# Patient Record
Sex: Male | Born: 1950 | Race: White | Hispanic: No | Marital: Single | Smoking: Former smoker
Health system: Southern US, Community
[De-identification: ages and names within clinical notes are randomized; demographics above are authoritative.]

## PROBLEM LIST (undated history)

## (undated) DIAGNOSIS — F419 Anxiety disorder, unspecified: Secondary | ICD-10-CM

## (undated) DIAGNOSIS — K219 Gastro-esophageal reflux disease without esophagitis: Secondary | ICD-10-CM

## (undated) DIAGNOSIS — I10 Essential (primary) hypertension: Secondary | ICD-10-CM

## (undated) DIAGNOSIS — G4730 Sleep apnea, unspecified: Secondary | ICD-10-CM

## (undated) DIAGNOSIS — R410 Disorientation, unspecified: Secondary | ICD-10-CM

## (undated) DIAGNOSIS — F319 Bipolar disorder, unspecified: Secondary | ICD-10-CM

## (undated) DIAGNOSIS — E876 Hypokalemia: Secondary | ICD-10-CM

## (undated) DIAGNOSIS — I509 Heart failure, unspecified: Secondary | ICD-10-CM

---

## 2013-12-01 ENCOUNTER — Inpatient Hospital Stay: Payer: Self-pay | Admitting: Internal Medicine

## 2013-12-01 LAB — COMPREHENSIVE METABOLIC PANEL
ALK PHOS: 83 U/L
Albumin: 2.7 g/dL — ABNORMAL LOW (ref 3.4–5.0)
Anion Gap: 6 — ABNORMAL LOW (ref 7–16)
BUN: 15 mg/dL (ref 7–18)
Bilirubin,Total: 0.7 mg/dL (ref 0.2–1.0)
Calcium, Total: 8.2 mg/dL — ABNORMAL LOW (ref 8.5–10.1)
Chloride: 99 mmol/L (ref 98–107)
Co2: 27 mmol/L (ref 21–32)
Creatinine: 0.87 mg/dL (ref 0.60–1.30)
EGFR (Non-African Amer.): >60
GLUCOSE: 171 mg/dL — AB (ref 65–99)
OSMOLALITY: 269 (ref 275–301)
Potassium: 3.4 mmol/L — ABNORMAL LOW (ref 3.5–5.1)
SGOT(AST): 44 U/L — ABNORMAL HIGH (ref 15–37)
SGPT (ALT): 25 U/L (ref 12–78)
Sodium: 132 mmol/L — ABNORMAL LOW (ref 136–145)
TOTAL PROTEIN: 7.8 g/dL (ref 6.4–8.2)

## 2013-12-01 LAB — URINALYSIS, COMPLETE
Bilirubin,UR: NEGATIVE
Glucose,UR: 50 mg/dL (ref 0–75)
Ketone: NEGATIVE
LEUKOCYTE ESTERASE: NEGATIVE
NITRITE: NEGATIVE
Ph: 5 (ref 4.5–8.0)
Specific Gravity: 1.018 (ref 1.003–1.030)
Squamous Epithelial: <1
WBC UR: 3 /HPF (ref 0–5)

## 2013-12-01 LAB — TROPONIN I: Troponin-I: 0.04 ng/mL

## 2013-12-01 LAB — CBC
HCT: 38.2 % — ABNORMAL LOW (ref 40.0–52.0)
HGB: 12.5 g/dL — ABNORMAL LOW (ref 13.0–18.0)
MCH: 29.2 pg (ref 26.0–34.0)
MCHC: 32.8 g/dL (ref 32.0–36.0)
MCV: 89 fL (ref 80–100)
Platelet: 189 10*3/uL (ref 150–440)
RBC: 4.29 10*6/uL — ABNORMAL LOW (ref 4.40–5.90)
RDW: 14.9 % — ABNORMAL HIGH (ref 11.5–14.5)
WBC: 9.8 10*3/uL (ref 3.8–10.6)

## 2013-12-01 LAB — PRO B NATRIURETIC PEPTIDE: B-Type Natriuretic Peptide: 899 pg/mL — ABNORMAL HIGH (ref 0–125)

## 2013-12-02 LAB — BASIC METABOLIC PANEL
ANION GAP: 5 — AB (ref 7–16)
BUN: 17 mg/dL (ref 7–18)
Calcium, Total: 8.4 mg/dL — ABNORMAL LOW (ref 8.5–10.1)
Chloride: 103 mmol/L (ref 98–107)
Co2: 30 mmol/L (ref 21–32)
Creatinine: 0.76 mg/dL (ref 0.60–1.30)
EGFR (African American): >60
EGFR (Non-African Amer.): >60
Glucose: 75 mg/dL (ref 65–99)
Osmolality: 276 (ref 275–301)
Potassium: 3 mmol/L — ABNORMAL LOW (ref 3.5–5.1)
Sodium: 138 mmol/L (ref 136–145)

## 2013-12-02 LAB — CBC WITH DIFFERENTIAL/PLATELET
Basophil #: 0 10*3/uL (ref 0.0–0.1)
Basophil %: 0.4 %
EOS PCT: 0.1 %
Eosinophil #: 0 10*3/uL (ref 0.0–0.7)
HCT: 35.2 % — ABNORMAL LOW (ref 40.0–52.0)
HGB: 11.6 g/dL — ABNORMAL LOW (ref 13.0–18.0)
LYMPHS PCT: 13.3 %
Lymphocyte #: 1 10*3/uL (ref 1.0–3.6)
MCH: 29 pg (ref 26.0–34.0)
MCHC: 32.9 g/dL (ref 32.0–36.0)
MCV: 88 fL (ref 80–100)
Monocyte #: 0.5 x10 3/mm (ref 0.2–1.0)
Monocyte %: 7 %
NEUTROS ABS: 5.8 10*3/uL (ref 1.4–6.5)
Neutrophil %: 79.2 %
PLATELETS: 173 10*3/uL (ref 150–440)
RBC: 3.99 10*6/uL — AB (ref 4.40–5.90)
RDW: 14.8 % — ABNORMAL HIGH (ref 11.5–14.5)
WBC: 7.3 10*3/uL (ref 3.8–10.6)

## 2013-12-02 LAB — CLOSTRIDIUM DIFFICILE(ARMC)

## 2013-12-02 LAB — LIPID PANEL
Cholesterol: 81 mg/dL (ref 0–200)
HDL Cholesterol: 29 mg/dL — ABNORMAL LOW (ref 40–60)
Ldl Cholesterol, Calc: 42 mg/dL (ref 0–100)
Triglycerides: 48 mg/dL (ref 0–200)
VLDL CHOLESTEROL, CALC: 10 mg/dL (ref 5–40)

## 2013-12-02 LAB — URINE CULTURE

## 2013-12-04 LAB — BASIC METABOLIC PANEL
Anion Gap: 2 — ABNORMAL LOW (ref 7–16)
BUN: 13 mg/dL (ref 7–18)
CO2: 34 mmol/L — AB (ref 21–32)
CREATININE: 0.74 mg/dL (ref 0.60–1.30)
Calcium, Total: 8.6 mg/dL (ref 8.5–10.1)
Chloride: 101 mmol/L (ref 98–107)
EGFR (African American): >60
EGFR (Non-African Amer.): >60
GLUCOSE: 95 mg/dL (ref 65–99)
Osmolality: 274 (ref 275–301)
Potassium: 3.2 mmol/L — ABNORMAL LOW (ref 3.5–5.1)
Sodium: 137 mmol/L (ref 136–145)

## 2013-12-05 ENCOUNTER — Ambulatory Visit (HOSPITAL_COMMUNITY)
Admission: AD | Admit: 2013-12-05 | Discharge: 2013-12-05 | Disposition: A | Discharge status: Home / Self Care | Payer: Self-pay | Source: Other Acute Inpatient Hospital | Attending: Internal Medicine | Admitting: Internal Medicine

## 2013-12-05 DIAGNOSIS — J218 Acute bronchiolitis due to other specified organisms: Secondary | ICD-10-CM | POA: Insufficient documentation

## 2013-12-05 LAB — BASIC METABOLIC PANEL
Anion Gap: 5 — ABNORMAL LOW (ref 7–16)
BUN: 11 mg/dL (ref 7–18)
Calcium, Total: 8.4 mg/dL — ABNORMAL LOW (ref 8.5–10.1)
Chloride: 102 mmol/L (ref 98–107)
Co2: 35 mmol/L — ABNORMAL HIGH (ref 21–32)
Creatinine: 0.77 mg/dL (ref 0.60–1.30)
EGFR (African American): >60
Glucose: 87 mg/dL (ref 65–99)
Osmolality: 282 (ref 275–301)
Potassium: 3.1 mmol/L — ABNORMAL LOW (ref 3.5–5.1)
Sodium: 142 mmol/L (ref 136–145)

## 2013-12-05 LAB — MAGNESIUM: MAGNESIUM: 1.8 mg/dL

## 2013-12-06 LAB — CULTURE, BLOOD (SINGLE)

## 2014-01-01 ENCOUNTER — Other Ambulatory Visit: Payer: Self-pay | Admitting: Family Medicine

## 2014-01-01 LAB — CLOSTRIDIUM DIFFICILE(ARMC)

## 2014-03-25 ENCOUNTER — Other Ambulatory Visit: Payer: Self-pay | Admitting: Family Medicine

## 2014-03-25 LAB — URINALYSIS, COMPLETE
Bacteria: NONE SEEN
Bilirubin,UR: NEGATIVE
Blood: NEGATIVE
Glucose,UR: NEGATIVE mg/dL (ref 0–75)
KETONE: NEGATIVE
LEUKOCYTE ESTERASE: NEGATIVE
NITRITE: NEGATIVE
PH: 5 (ref 4.5–8.0)
Protein: NEGATIVE
RBC,UR: 1 /HPF (ref 0–5)
Specific Gravity: 1.016 (ref 1.003–1.030)
Squamous Epithelial: <1

## 2014-03-27 LAB — URINE CULTURE

## 2015-03-05 NOTE — Discharge Summary (Signed)
PATIENT NAME:  Dwayne Bridges, Dwayne Bridges#:  [id number] DATE OF BIRTH:  June 07, 1951  DATE OF ADMISSION:  12/01/2013 DATE OF DISCHARGE:  12/02/2013  The patient [facility] be [facility] where [facility].  We were awaiting a bed.    CHIEF COMPLAINT: Shortness of breath, hypoxia.   CURRENT DIAGNOSES:  1.  Acute respiratory failure secondary to acute diastolic congestive heart failure and a possible acute bronchitis.  2.  Sepsis on arrival with, tachycardia, tachypnea, possible bronchitis and Clostridium difficile.  3.  Clostridium difficile.  4.  Hypertension.  5.  Hypokalemia.  6.  Mild hyponatremia.  7.  History of mental retardation.  8.  Recent history of bilateral lower extremity cellulitis.   CURRENT MEDICATIONS: Aspirin 81 mg daily, Lovenox 40 mg subcutaneous daily, finasteride 5 mg daily, lisinopril 20 mg daily, terazosin 10 mg at bedtime, atorvastatin 40 mg daily, azithromycin 250 mg daily, Lasix 20 mg daily, lisinopril 20 mg daily, potassium chloride 60 mEq once a day, Flagyl 500 mg q. 8 hours, of note, we had stopped Levaquin IV, Zosyn IV today, as well as a Flagyl 250 mg q. 6 hours IV  HISTORY OF PRESENT ILLNESS AND HOSPITAL COURSE:  For full details of H and P, please see the dictation on January 20, by Dr. Amado CoeGouru, but briefly this is a 64 year old with hypertension, hyperlipidemia, and mentally challenged who has been going [facility].  He, of note, was [facility] for lower extremity infection was sent to rehab. He came in for cough, shortness of breath. He of note, had been given a prescription for possible pneumonia, but because of shortness of breath, he came in here where he was noted to have a pulse oximetry. He was initially transitioned onto a BiPAP for his hypoxemia. He had tachycardia with some PVCs on EKG. He was also tachypneic. Some Lasix was given and the patient had an x-ray of the chest done, which showed vascular  congestion with a bibasilar airspace opacities raising concern for mild pulmonary edema and pneumonia could also have a similar appearance. He was started on broad-spectrum antibiotics and also was given a dose of IV Lasix. He was initially  transitioned to Critical Care Unit from the ER; however, soon came off the BiPAP, was verbalizing and was placed on a nasal cannula. Echocardiogram was obtained showing ejection fraction of about 60% to 65% with impaired relaxation pattern of LV diastolic filling. At this point, his shortness of breath is significantly better. I doubt the patient had significant lobar pneumonia. He did not come with fevers or leukocytosis. I have stopped the broad-spectrum antibiotics and have started him on azithromycin for possible bronchitis, but I suspect that the bulk of his shortness of breath was likely acute diastolic congestive heart failure and have started him on 20 mg IV Lasix at this point. He did come in with sepsis on arrival with tachycardia and tachypnea and has tested positive for C. difficile here. He possibly also has bronchitis. Currently, he is on Flagyl and azithromycin and I have stopped broad-spectrum antibiotics. He did also present with mild hyponatremia with sodium of 132 with potassium of 3.4, mild hypokalemia, and sodium has corrected and potassium of be replaced today. [facility]. His blood cultures and urine cultures have been not grown to date.   CODE STATUS:  The patient is DO NOT RESUSCITATE per family's request.  TOTAL TIME SPENT: 35 minutes.   ____________________________ Krystal Eaton, MD sa:cc D: 12/02/2013 15:26:00 ET T: 12/02/2013 20:42:41 ET JOB#: [id number]  cc: Krystal Eaton, MD, <Dictator> Krystal Eaton MD ELECTRONICALLY SIGNED 12/26/2013 10:52

## 2015-03-05 NOTE — Discharge Summary (Signed)
PATIENT NAME:  Dwayne Bridges, Dwayne Bridges MR#:  [id number] DATE OF BIRTH:  04/16/1951  DATE OF ADMISSION:  12/01/2013 DATE OF DISCHARGE:  12/05/2013   Addendum to Discharge Summary   Please refer to the discharge summary on 12/02/2013. This is an addendum on 12/05/2013.  DISPOSITION: The patient [facility].   DISCHARGE DIAGNOSES:  1. Clostridium difficile colitis.  2. Sepsis secondary to Clostridium difficile colitis and bronchitis.  3. Acute bronchitis.  4. Acute diastolic heart failure.    HOSPITAL COURSE: The patient's hospital course remains the same as already dictated.   DISCHARGE MEDICATIONS: As follows:  1. DuoNebs 3 mL q.4 hours.  2. Flagyl 500 mg q.8 hours.  3. Atorvastatin 40 mg daily.  4. Tylenol 325 two tablets q.6 hours as needed.  5. Aspirin 81 mg daily.  6. Finasteride 5 mg daily.  7. Ensure Plus 3 times a day.  8. Theravite 1 tablet daily.  9. Terazosin 10 mg daily.  10. Lisinopril 40 mg daily.  11. Nystatin t.i.d.  12. Lasix 20 mg IV q.12 hours.  13. Azithromycin 250 mg daily for 4 more days.  14. Potassium chloride 20 mEq b.i.d.   HISTORY AND HOSPITAL COURSE: A 64 year old male who presented with diarrhea and sepsis, found to have C. difficile colitis and acute bronchitis. For further details, please refer to the H and P.   1. Acute hypoxic respiratory distress. This patient recovered rather quickly. Doubt it was pneumonia. He was weaned off the BiPAP and onto nasal cannula, without fevers and leukocytosis. He was placed on azithromycin for bronchitis. His blood cultures are negative to date. He also has a component of acute diastolic heart failure. His echocardiogram showed normal ejection fraction, but he has diastolic dysfunction on echocardiogram. I am increasing his Lasix to 20 IV b.i.d. He continues to be on 4 liters of oxygen. Not complaining of shortness of breath, but he does not wear oxygen at home.  2. Sepsis on arrival, with  tachycardia, tachypnea and possible bronchitis and C. difficile colitis. Blood cultures negative to date. The patient was continued on Flagyl, continue Zithromax for 3 more days. He has a Flexi-Seal in place for his C. difficile colitis.  3. Hypertension. The patient is hemodynamically stable. We are holding hydrochlorothiazide  due to hypokalemia.  4. Hypokalemia from diarrhea. His magnesium is normal. This is being repleted.  5. Hyponatremia due to diarrhea, which has improved.  6. Rash, likely fungal in nature. The patient is on nystatin. Can follow up with dermatology as an outpatient.   DISCHARGE INSTRUCTIONS: The [facility] is being [facility]. He is continued on a low fat, low cholesterol diet. He has a Flexi-Seal in place. His diarrhea is improving. He was started on Flagyl on the 21st and will need at least 14 days of therapy.   The patient is being transferred. The patient is medically stable.   TIME SPENT: 35 minutes.   ____________________________ Janyth ContesSital P. Juliene PinaMody, MD spm:lb D: 12/05/2013 13:11:56 ET T: 12/05/2013 13:30:09 ET JOB#: [id number]  cc: Skiler Olden P. Juliene PinaMody, MD, <Dictator> Janyth ContesSITAL P Koraline Phillipson MD ELECTRONICALLY SIGNED 12/05/2013 14:24

## 2015-03-05 NOTE — H&P (Signed)
PATIENT NAME:  Dwayne Bridges, Dwayne Bridges DATE OF BIRTH:  11-Jun-1951  DATE OF ADMISSION:  12/01/2013  PRIMARY CARE PHYSICIAN: Nonlocal.  REFERRING EMERGENCY ROOM PHYSICIAN: Lucrezia EuropeAllison Webster, MD   CHIEF COMPLAINT: Shortness of breath and hypoxia.   HISTORY OF PRESENT ILLNESS: The patient is a 64 year old mentally challenged male with past medical history of hypertension, hyperlipidemia, and other medical problems who was [facility] for lower extremity infection and he was eventually sent over to rehabilitation center. For the past 2 to 3 days, the patient has been coughing, and he had a chest x-ray done and diagnosed with pneumonia. He was started on antibiotics. A prescription was given, but planning to start antibiotics from today, but last night the patient became extremely short of breath and could not breathe. His pulse ox dropped down anywhere between 40 to 80%. It patient was initially placed on nonrebreather by EMS and he was brought into the ER. In the ER, with the patient being short of breath and hypoxic he was placed on BiPAP. A 12-lead EKG has revealed sinus tachycardia with PVCs. The patient's chest x-ray, portable, has revealed bibasilar airspace opacities with pulmonary edema which could be from pneumonia. Blood cultures and urine cultures were ordered, and the patient was given IV levofloxacin. Hospitalist team is called to admit the patient. During my examination, the patient was quite lethargic and on BiPAP, and I was unable to get any history from him. His nephew is present next to the bed and he has provided the details of the patient. According to the patient's nephew, the patient is mentally challenged and he used to live alone prior to his admission to [facility]. Niece and nephew used to provide him food. According to the nephew, the patient needs feeding assistance and the patient was ambulated prior to this acute illness. The patient has code status  as DNR and the patient's niece and nephew want to continue his code status as DNR.   PAST MEDICAL HISTORY: Mental retardation, hyperlipidemia, hypertension, and recent history of bilateral lower extremity cellulitis.   PAST SURGICAL HISTORY: None.  ALLERGIES: No known drug allergies.   PSYCHOSOCIAL HISTORY: The patient used to live at home. He used to live alone, but after [facility] he was sent over to the rehab center. No history of smoking, alcohol, or illicit drug usage.   FAMILY HISTORY: Heart problems run in his family.   REVIEW OF SYSTEMS: Unobtainable as the patient is lethargic and on BiPAP machine.   MEDICATIONS: At the nursing home: Tylenol 325 mg 2 tablets p.o. q. 6 hours as needed, thiamine 100 mg p.o. once daily, terazosin 10 mg p.o. once daily, lisinopril 20 mg p.o. once daily, Rocephin 1 gram injectable IM or IV, Robitussin 100 mg/5 mL 20 mL p.o. q. 4 hours as needed, lisinopril 20 mg p.o. once daily, hydrochlorothiazide 12.5 mg once a day, finasteride 5 mg once a day, aspirin 81 mg once daily, atorvastatin 40 mg p.o. at bedtime.   PHYSICAL EXAMINATION: VITAL SIGNS: Temperature is 98.7, pulse 94, respirations 28, blood pressure 113/67, and pulse ox is 97% on BiPAP.  HEENT: Normocephalic, atraumatic. Pupils are equal and react to light and accommodation. No scleral icterus. No sinus tenderness. The patient is on BiPAP mask.  NECK: Supple. No JVD. No thyromegaly.  LUNGS: Coarse bronchial breath sounds. No wheezing. Moderate air entry.  CARDIOVASCULAR: S1 and S2, regular rate and rhythm. No murmurs.  GASTROINTESTINAL: Soft.  Bowel sounds are positive in all 4 quadrants. Nontender, nondistended. No masses felt.  NEUROLOGIC: The patient is very lethargic and with altered mental status. He is on BiPAP mask. Cranial nerves could not be elicited. Motor and sensory could not be elicited as the patient is lethargic and with altered mentation.  EXTREMITIES: No cyanosis  and no clubbing, but he has bilateral lower extremity erythema with excoriations. According to the nephew at bedside, the erythema is trending down as he was treated at [facility].  SKIN: Warm to touch, normal turgor. No rashes. PSYCH: Mood and affect could not be elicited as the patient is lethargic.  MUSCULOSKELETAL: No joint effusion, tenderness, or erythema.   LABS AND IMAGING STUDIES: Portable chest x-ray has revealed vascular congestion with bibasilar airspace opacity raising concern for mild pulmonary edema. Pneumonia might have similar appearance. Small left pleural effusion is suspected.   BNP is 899. Glucose 171, BUN 15, creatinine 0.87, sodium 132, potassium 3.4, chloride 99, CO2 27. GFR is greater than 60. Anion gap 6. Serum osmolality 269. Calcium 8.2. LFTs: Total albumin is 2.7, bili total 0.7, alkaline phosphatase 83, AST 44, ALT 25, total protein serum is 7.8. Troponin 0.04. WBC 9.8, hemoglobin 12.5, hematocrit 38.2, and platelets 189. Urinalysis: Cloudy in appearance, yellow in color. Nitrite and leukocyte esterase are negative. Granular casts are present. Amorphous crystals are present.  ABG on FiO2 50% with pH 7.32, pCO2 53, pO2 84, bicarb 27.3, and base excess is 0.3.   Twelve-lead EKG: Sinus tachycardia and PVCs.   ASSESSMENT AND PLAN: A 64 year old Caucasian male who is mentally challenged and was recently admitted to [facility] regarding lower extremity cellulitis, got discharged to rehabilitation center, was brought into the ER for shortness of breath. He will be admitted with the following assessment and plan.  1.  Acute hypoxic respiratory distress, probably from pneumonia and parapneumonic effusions. There might be component of new onset congestive heart failure. We will admit the patient to CCU stepdown. The patient will be on BiPAP. We will get pan cultures. Antibiotics with Zosyn and Levaquin as the patient was recently admitted and used antibiotics.  2.   Possible component of congestive heart failure. Will obtain a 2-D echocardiogram and will provide him Lasix on as needed basis.  3.  Hyperlipidemia. Once the patient is more awake and alert we will resume his home medication, statin.  4.  Hypertension. Blood pressure is stable at this time. The patient will be continuing his home medication, lisinopril.  5.  Mental retardation. Nephew at bedside is requesting case management consult regarding his placement.   The patient is DNR. The patient's niece, Ms. Corrie Dandy, is the medical power of attorney. Diagnosis and plan of care was discussed in detail with the patient's nephew who is at bedside. Ms. Corrie Dandy is currently resting and she will come to the hospital in a few hours.   TOTAL TIME SPENT ON ADMISSION: 50 minutes. ____________________________ Ramonita Lab, MD ag:sb D: 12/01/2013 07:40:14 ET T: 12/01/2013 08:38:23 ET JOB#: [id number]  cc: Ramonita Lab, MD, <Dictator> Ramonita Lab MD ELECTRONICALLY SIGNED 12/18/2013 7:23

## 2016-04-02 ENCOUNTER — Other Ambulatory Visit
Admission: RE | Admit: 2016-04-02 | Discharge: 2016-04-02 | Disposition: A | Discharge status: Home / Self Care | Payer: Medicaid Other | Source: Ambulatory Visit | Attending: Nurse Practitioner | Admitting: Nurse Practitioner

## 2016-04-02 DIAGNOSIS — K922 Gastrointestinal hemorrhage, unspecified: Secondary | ICD-10-CM | POA: Insufficient documentation

## 2016-04-02 LAB — CBC
HCT: 36.6 % — ABNORMAL LOW (ref 40.0–52.0)
Hemoglobin: 12.2 g/dL — ABNORMAL LOW (ref 13.0–18.0)
MCH: 31.1 pg (ref 26.0–34.0)
MCHC: 33.3 g/dL (ref 32.0–36.0)
MCV: 93.2 fL (ref 80.0–100.0)
PLATELETS: 156 10*3/uL (ref 150–440)
RBC: 3.93 MIL/uL — AB (ref 4.40–5.90)
RDW: 16 % — ABNORMAL HIGH (ref 11.5–14.5)
WBC: 4.4 10*3/uL (ref 3.8–10.6)

## 2016-04-02 LAB — POTASSIUM

## 2019-03-27 ENCOUNTER — Inpatient Hospital Stay (HOSPITAL_COMMUNITY)
Admission: AD | Admit: 2019-03-27 | Discharge: 2019-03-31 | DRG: 177 | Disposition: A | Discharge status: Skilled Nursing Facility | Payer: Medicaid Other | Source: Other Acute Inpatient Hospital | Attending: Internal Medicine | Admitting: Internal Medicine

## 2019-03-27 ENCOUNTER — Emergency Department: Payer: Medicaid Other

## 2019-03-27 ENCOUNTER — Other Ambulatory Visit: Payer: Self-pay

## 2019-03-27 ENCOUNTER — Encounter (HOSPITAL_COMMUNITY): Payer: Self-pay

## 2019-03-27 ENCOUNTER — Inpatient Hospital Stay (HOSPITAL_COMMUNITY): Payer: Self-pay

## 2019-03-27 ENCOUNTER — Encounter: Payer: Self-pay | Admitting: Emergency Medicine

## 2019-03-27 ENCOUNTER — Emergency Department
Admission: EM | Admit: 2019-03-27 | Discharge: 2019-03-27 | Disposition: A | Discharge status: Other Acute Inpatient Hospital | Payer: Medicaid Other | Attending: Emergency Medicine | Admitting: Emergency Medicine

## 2019-03-27 DIAGNOSIS — F919 Conduct disorder, unspecified: Secondary | ICD-10-CM | POA: Diagnosis not present

## 2019-03-27 DIAGNOSIS — I4891 Unspecified atrial fibrillation: Secondary | ICD-10-CM | POA: Diagnosis not present

## 2019-03-27 DIAGNOSIS — Y95 Nosocomial condition: Secondary | ICD-10-CM | POA: Diagnosis present

## 2019-03-27 DIAGNOSIS — I509 Heart failure, unspecified: Secondary | ICD-10-CM | POA: Diagnosis not present

## 2019-03-27 DIAGNOSIS — J1289 Other viral pneumonia: Secondary | ICD-10-CM | POA: Diagnosis present

## 2019-03-27 DIAGNOSIS — I10 Essential (primary) hypertension: Secondary | ICD-10-CM | POA: Diagnosis present

## 2019-03-27 DIAGNOSIS — N400 Enlarged prostate without lower urinary tract symptoms: Secondary | ICD-10-CM | POA: Diagnosis present

## 2019-03-27 DIAGNOSIS — E785 Hyperlipidemia, unspecified: Secondary | ICD-10-CM | POA: Diagnosis not present

## 2019-03-27 DIAGNOSIS — L03115 Cellulitis of right lower limb: Secondary | ICD-10-CM | POA: Diagnosis present

## 2019-03-27 DIAGNOSIS — J9621 Acute and chronic respiratory failure with hypoxia: Secondary | ICD-10-CM | POA: Diagnosis present

## 2019-03-27 DIAGNOSIS — L97919 Non-pressure chronic ulcer of unspecified part of right lower leg with unspecified severity: Secondary | ICD-10-CM | POA: Diagnosis not present

## 2019-03-27 DIAGNOSIS — I5033 Acute on chronic diastolic (congestive) heart failure: Secondary | ICD-10-CM | POA: Diagnosis not present

## 2019-03-27 DIAGNOSIS — U071 COVID-19: Principal | ICD-10-CM | POA: Diagnosis present

## 2019-03-27 DIAGNOSIS — I480 Paroxysmal atrial fibrillation: Secondary | ICD-10-CM | POA: Diagnosis not present

## 2019-03-27 DIAGNOSIS — J9601 Acute respiratory failure with hypoxia: Secondary | ICD-10-CM | POA: Diagnosis not present

## 2019-03-27 DIAGNOSIS — Z66 Do not resuscitate: Secondary | ICD-10-CM | POA: Diagnosis present

## 2019-03-27 DIAGNOSIS — Z79899 Other long term (current) drug therapy: Secondary | ICD-10-CM

## 2019-03-27 DIAGNOSIS — I110 Hypertensive heart disease with heart failure: Secondary | ICD-10-CM | POA: Diagnosis not present

## 2019-03-27 DIAGNOSIS — R0602 Shortness of breath: Secondary | ICD-10-CM

## 2019-03-27 DIAGNOSIS — R0902 Hypoxemia: Secondary | ICD-10-CM | POA: Diagnosis not present

## 2019-03-27 HISTORY — DX: Essential (primary) hypertension: I10

## 2019-03-27 HISTORY — DX: Heart failure, unspecified: I50.9

## 2019-03-27 LAB — CBC WITH DIFFERENTIAL/PLATELET
Abs Immature Granulocytes: 0.02 10*3/uL (ref 0.00–0.07)
Basophils Absolute: 0 10*3/uL (ref 0.0–0.1)
Basophils Relative: 0 %
Eosinophils Absolute: 0 10*3/uL (ref 0.0–0.5)
Eosinophils Relative: 0 %
HCT: 36.5 % — ABNORMAL LOW (ref 39.0–52.0)
Hemoglobin: 11.2 g/dL — ABNORMAL LOW (ref 13.0–17.0)
Immature Granulocytes: 0 %
Lymphocytes Relative: 11 %
Lymphs Abs: 0.6 10*3/uL — ABNORMAL LOW (ref 0.7–4.0)
MCH: 28.8 pg (ref 26.0–34.0)
MCHC: 30.7 g/dL (ref 30.0–36.0)
MCV: 93.8 fL (ref 80.0–100.0)
Monocytes Absolute: 0.4 10*3/uL (ref 0.1–1.0)
Monocytes Relative: 9 %
Neutro Abs: 3.9 10*3/uL (ref 1.7–7.7)
Neutrophils Relative %: 80 %
Platelets: 182 10*3/uL (ref 150–400)
RBC: 3.89 MIL/uL — ABNORMAL LOW (ref 4.22–5.81)
RDW: 15 % (ref 11.5–15.5)
WBC: 4.9 10*3/uL (ref 4.0–10.5)
nRBC: 0 % (ref 0.0–0.2)

## 2019-03-27 LAB — CBC
HCT: 39.3 % (ref 39.0–52.0)
Hemoglobin: 11.7 g/dL — ABNORMAL LOW (ref 13.0–17.0)
MCH: 28.7 pg (ref 26.0–34.0)
MCHC: 29.8 g/dL — ABNORMAL LOW (ref 30.0–36.0)
MCV: 96.6 fL (ref 80.0–100.0)
Platelets: 181 10*3/uL (ref 150–400)
RBC: 4.07 MIL/uL — ABNORMAL LOW (ref 4.22–5.81)
RDW: 14.9 % (ref 11.5–15.5)
WBC: 4.9 10*3/uL (ref 4.0–10.5)
nRBC: 0 % (ref 0.0–0.2)

## 2019-03-27 LAB — COMPREHENSIVE METABOLIC PANEL
ALT: 20 U/L (ref 0–44)
AST: 40 U/L (ref 15–41)
Albumin: 2.5 g/dL — ABNORMAL LOW (ref 3.5–5.0)
Alkaline Phosphatase: 54 U/L (ref 38–126)
Anion gap: 9 (ref 5–15)
BUN: 19 mg/dL (ref 8–23)
CO2: 26 mmol/L (ref 22–32)
Calcium: 7.9 mg/dL — ABNORMAL LOW (ref 8.9–10.3)
Chloride: 102 mmol/L (ref 98–111)
Creatinine, Ser: 1.05 mg/dL (ref 0.61–1.24)
GFR calc Af Amer: >60 mL/min (ref >60)
GFR calc non Af Amer: >60 mL/min (ref >60)
Glucose, Bld: 160 mg/dL — ABNORMAL HIGH (ref 70–99)
Potassium: 4.3 mmol/L (ref 3.5–5.1)
Sodium: 137 mmol/L (ref 135–145)
Total Bilirubin: 0.8 mg/dL (ref 0.3–1.2)
Total Protein: 6.6 g/dL (ref 6.5–8.1)

## 2019-03-27 LAB — BRAIN NATRIURETIC PEPTIDE: B Natriuretic Peptide: 168 pg/mL — ABNORMAL HIGH (ref 0.0–100.0)

## 2019-03-27 LAB — URINALYSIS, ROUTINE W REFLEX MICROSCOPIC
Bilirubin Urine: NEGATIVE
Glucose, UA: NEGATIVE mg/dL
Hgb urine dipstick: NEGATIVE
Ketones, ur: NEGATIVE mg/dL
Leukocytes,Ua: NEGATIVE
Nitrite: NEGATIVE
Protein, ur: NEGATIVE mg/dL
Specific Gravity, Urine: 1.008 (ref 1.005–1.030)
pH: 5 (ref 5.0–8.0)

## 2019-03-27 LAB — LACTATE DEHYDROGENASE: LDH: 217 U/L — ABNORMAL HIGH (ref 98–192)

## 2019-03-27 LAB — CREATININE, SERUM
Creatinine, Ser: 1 mg/dL (ref 0.61–1.24)
GFR calc Af Amer: >60 mL/min (ref >60)
GFR calc non Af Amer: >60 mL/min (ref >60)

## 2019-03-27 LAB — D-DIMER, QUANTITATIVE: D-Dimer, Quant: 1.74 ug/mL-FEU — ABNORMAL HIGH (ref 0.00–0.50)

## 2019-03-27 LAB — FIBRINOGEN: Fibrinogen: 589 mg/dL — ABNORMAL HIGH (ref 210–475)

## 2019-03-27 LAB — C-REACTIVE PROTEIN: CRP: 14.5 mg/dL — ABNORMAL HIGH (ref <1.0)

## 2019-03-27 LAB — ABO/RH: ABO/RH(D): O POS

## 2019-03-27 LAB — PROCALCITONIN: Procalcitonin: <0.1 ng/mL

## 2019-03-27 LAB — LACTIC ACID, PLASMA: Lactic Acid, Venous: 1.3 mmol/L (ref 0.5–1.9)

## 2019-03-27 LAB — SEDIMENTATION RATE: Sed Rate: 58 mm/hr — ABNORMAL HIGH (ref 0–20)

## 2019-03-27 LAB — FIBRIN DERIVATIVES D-DIMER (ARMC ONLY): Fibrin derivatives D-dimer (ARMC): 1388.91 ng/mL (FEU) — ABNORMAL HIGH (ref 0.00–499.00)

## 2019-03-27 LAB — PROTIME-INR
INR: 1.1 (ref 0.8–1.2)
Prothrombin Time: 14.2 seconds (ref 11.4–15.2)

## 2019-03-27 LAB — TROPONIN I: Troponin I: 0.04 ng/mL (ref <0.03)

## 2019-03-27 LAB — FERRITIN: Ferritin: 166 ng/mL (ref 24–336)

## 2019-03-27 MED ORDER — FINASTERIDE 5 MG PO TABS
5.0000 mg | ORAL_TABLET | Freq: Every day | ORAL | Status: DC
  Administered 2019-03-27 – 2019-03-31 (×5): 5 mg via ORAL
  Filled 2019-03-27 (×6): qty 1

## 2019-03-27 MED ORDER — ENOXAPARIN SODIUM 40 MG/0.4ML ~~LOC~~ SOLN
40.0000 mg | SUBCUTANEOUS | Status: DC
  Administered 2019-03-27: 21:00:00 40 mg via SUBCUTANEOUS
  Filled 2019-03-27: qty 0.4

## 2019-03-27 MED ORDER — ONDANSETRON HCL 4 MG/2ML IJ SOLN
4.0000 mg | Freq: Four times a day (QID) | INTRAMUSCULAR | Status: DC | PRN
  Filled 2019-03-27: qty 2

## 2019-03-27 MED ORDER — ACETAMINOPHEN 500 MG PO TABS
1000.0000 mg | ORAL_TABLET | Freq: Once | ORAL | Status: AC
  Administered 2019-03-27: 1000 mg via ORAL
  Filled 2019-03-27: qty 2

## 2019-03-27 MED ORDER — DIVALPROEX SODIUM 125 MG PO CSDR
375.0000 mg | DELAYED_RELEASE_CAPSULE | Freq: Two times a day (BID) | ORAL | Status: DC
  Administered 2019-03-27 – 2019-03-31 (×8): 375 mg via ORAL
  Filled 2019-03-27 (×9): qty 3

## 2019-03-27 MED ORDER — TERAZOSIN HCL 5 MG PO CAPS
10.0000 mg | ORAL_CAPSULE | Freq: Every day | ORAL | Status: DC
  Filled 2019-03-27: qty 2

## 2019-03-27 MED ORDER — ACETAMINOPHEN 325 MG PO TABS: 650.0000 mg | ORAL_TABLET | ORAL | Status: DC | PRN

## 2019-03-27 MED ORDER — ATORVASTATIN CALCIUM 10 MG PO TABS
20.0000 mg | ORAL_TABLET | Freq: Every day | ORAL | Status: DC
  Administered 2019-03-27 – 2019-03-31 (×5): 20 mg via ORAL
  Filled 2019-03-27 (×5): qty 2

## 2019-03-27 MED ORDER — POTASSIUM CHLORIDE CRYS ER 20 MEQ PO TBCR
40.0000 meq | EXTENDED_RELEASE_TABLET | Freq: Two times a day (BID) | ORAL | Status: DC
  Administered 2019-03-27 – 2019-03-29 (×4): 40 meq via ORAL
  Filled 2019-03-27 (×4): qty 2

## 2019-03-27 MED ORDER — SODIUM CHLORIDE 0.9% FLUSH: 3.0000 mL | INTRAVENOUS | Status: DC | PRN

## 2019-03-27 MED ORDER — FUROSEMIDE 10 MG/ML IJ SOLN
60.0000 mg | Freq: Two times a day (BID) | INTRAMUSCULAR | Status: DC
  Administered 2019-03-27 – 2019-03-29 (×4): 60 mg via INTRAVENOUS
  Filled 2019-03-27 (×4): qty 6

## 2019-03-27 MED ORDER — ASPIRIN EC 81 MG PO TBEC
81.0000 mg | DELAYED_RELEASE_TABLET | Freq: Every day | ORAL | Status: DC
  Administered 2019-03-27 – 2019-03-29 (×3): 81 mg via ORAL
  Filled 2019-03-27 (×3): qty 1

## 2019-03-27 MED ORDER — SODIUM CHLORIDE 0.9 % IV BOLUS: 1000.0000 mL | Freq: Once | INTRAVENOUS | Status: DC

## 2019-03-27 MED ORDER — SODIUM CHLORIDE 0.9% FLUSH
3.0000 mL | Freq: Two times a day (BID) | INTRAVENOUS | Status: DC
  Administered 2019-03-27 – 2019-03-28 (×2): 3 mL via INTRAVENOUS

## 2019-03-27 MED ORDER — SODIUM CHLORIDE 0.9 % IV SOLN: 250.0000 mL | INTRAVENOUS | Status: DC | PRN

## 2019-03-27 MED ORDER — TRAZODONE HCL 50 MG PO TABS
50.0000 mg | ORAL_TABLET | Freq: Every day | ORAL | Status: DC
  Administered 2019-03-27 – 2019-03-29 (×3): 50 mg via ORAL
  Filled 2019-03-27 (×3): qty 1

## 2019-03-27 MED ORDER — TERAZOSIN HCL 5 MG PO CAPS
5.0000 mg | ORAL_CAPSULE | Freq: Every day | ORAL | Status: DC
  Administered 2019-03-27 – 2019-03-30 (×4): 5 mg via ORAL
  Filled 2019-03-27 (×5): qty 1

## 2019-03-27 MED ORDER — ACETAMINOPHEN 325 MG PO TABS: 650.0000 mg | ORAL_TABLET | Freq: Four times a day (QID) | ORAL | Status: DC | PRN

## 2019-03-27 MED ORDER — SENNOSIDES-DOCUSATE SODIUM 8.6-50 MG PO TABS
1.0000 | ORAL_TABLET | Freq: Two times a day (BID) | ORAL | Status: DC
  Administered 2019-03-27 – 2019-03-29 (×3): 1 via ORAL
  Filled 2019-03-27 (×3): qty 1

## 2019-03-27 MED ORDER — FUROSEMIDE 10 MG/ML IJ SOLN
60.0000 mg | Freq: Once | INTRAMUSCULAR | Status: AC
  Administered 2019-03-27: 60 mg via INTRAVENOUS
  Filled 2019-03-27: qty 8

## 2019-03-27 NOTE — ED Notes (Signed)
[employer] contacted at this time for pt history

## 2019-03-27 NOTE — ED Notes (Signed)
Patient repositioned on stretcher with the help of Tom RN. Patient is tachypneic, labored respirations.

## 2019-03-27 NOTE — ED Triage Notes (Signed)
Pt from [facility] with c/o SOB and low o2 saturation this am. Per staff pt sats of 80% on RA. PT had + covid test. PT tachynpnic. MD at bedside

## 2019-03-27 NOTE — ED Notes (Signed)
Patient left [facility].

## 2019-03-27 NOTE — ED Notes (Signed)
[facility] reviewed , pt unable to sign transfer due to covid -19

## 2019-03-27 NOTE — ED Notes (Signed)
Condom cath applied with Dorian ED tech.

## 2019-03-27 NOTE — H&P (Signed)
History and Physical    JAQUAWN KEEF YTW:[id number] DOB: 04/05/1951 DOA: 03/27/2019  PCP: Patient, [name] Per  Patient coming from: SNF  I have personally briefly reviewed patient's old medical records in [facility]  Chief Complaint: shortness of breath  HPI: WESTYN SKOUSEN is a 68 y.o. male with medical history significant of diastolic chf, HTN, behavioral issues, is a resident of white [facility], was diagnosed with coronavirus approximately 1 week ago.  Family reports that he did not have any specific symptoms until 24 to 48 hours ago.  He is brought to the hospital with progressive shortness of breath.  He has worsening lower extremity edema.  Any cough.  He is mildly febrile.  He has not had any nausea, vomiting or diarrhea.  He denies any chest pain.  ED Course: Chest x-ray done in the emergency room shows evidence of congestive heart failure patient and possible left pleural effusion.  He is noted to be hypoxic on room air with oxygen saturations in the high 80s.  This improved to the low 90s on 2 L.  He has been referred for admission.  Review of Systems: As per HPI otherwise 10 point review of systems negative.    Past Medical History:  Diagnosis Date  . CHF (congestive heart failure) (HCC)   . Hypertension      Social History:  has no history on file for tobacco, alcohol, and drug.  No Known Allergies  Family history: Family history reviewed and not pertinent  Prior to Admission medications   Medication Sig Start Date End Date Taking? Authorizing Provider  acetaminophen (TYLENOL) 325 MG tablet Take 650 mg by mouth every 6 (six) hours as needed (leg pain).    [provider]  atorvastatin (LIPITOR) 20 MG tablet Take 20 mg by mouth daily.    [provider]  cephALEXin (KEFLEX) 500 MG capsule Take 500 mg by mouth 2 (two) times daily. 03/26/19 04/02/19  [provider]  divalproex (DEPAKOTE SPRINKLE) 125 MG capsule Take 375 mg by mouth 2  (two) times daily.    [provider]  finasteride (PROSCAR) 5 MG tablet Take 5 mg by mouth daily.    [provider]  furosemide (LASIX) 40 MG tablet Take 40 mg by mouth daily.    [provider]  lisinopril (ZESTRIL) 20 MG tablet Take 20 mg by mouth daily.    [provider]  Multiple Vitamin (MULTIVITAMIN) tablet Take 1 tablet by mouth daily.    [provider]  potassium chloride (K-DUR) 10 MEQ tablet Take 20 mEq by mouth daily.    [provider]  sennosides-docusate sodium (SENOKOT-S) 8.6-50 MG tablet Take 1 tablet by mouth 2 (two) times daily.    [provider]  terazosin (HYTRIN) 10 MG capsule Take 10 mg by mouth at bedtime.    [provider]  traZODone (DESYREL) 50 MG tablet Take 50 mg by mouth at bedtime.    [provider]    Physical Exam: Vitals:   03/27/19 1603  Temp: 98.8 F (37.1 C)  TempSrc: Oral    Constitutional: NAD, calm, comfortable Eyes: PERRL, lids and conjunctivae normal ENMT: Mucous membranes are moist. Posterior pharynx clear of any exudate or lesions.Normal dentition.  Neck: normal, supple, no masses, no thyromegaly Respiratory: Mild wheeze bilaterally.  Crackles at bases.  Increased respiratory effort Cardiovascular: Regular rate and rhythm, no murmurs / rubs / gallops. 2+ pedal pulses. No carotid bruits.  2+  pedal edema bilaterally Abdomen: no tenderness, no masses palpated. No hepatosplenomegaly. Bowel sounds positive.  Musculoskeletal: no clubbing / cyanosis. No joint deformity upper and lower extremities. Good ROM, no contractures. Normal muscle tone.  Skin: Chronic venous stasis changes Neurologic: CN 2-12 grossly intact. Sensation intact, DTR normal. Strength 5/5 in all 4.  Psychiatric: Normal judgment and insight. Alert and oriented x 3. Normal mood.    Labs on Admission: I have personally reviewed following labs and imaging studies  CBC: Recent Labs  Lab 03/27/19  1045  WBC 4.9  NEUTROABS 3.9  HGB 11.2*  HCT 36.5*  MCV 93.8  PLT 182   Basic Metabolic Panel: Recent Labs  Lab 03/27/19 1045  NA 137  K 4.3  CL 102  CO2 26  GLUCOSE 160*  BUN 19  CREATININE 1.05  CALCIUM 7.9*   GFR: CrCl cannot be calculated (Unknown ideal weight.). Liver Function Tests: Recent Labs  Lab 03/27/19 1045  AST 40  ALT 20  ALKPHOS 54  BILITOT 0.8  PROT 6.6  ALBUMIN 2.5*   No results for input(s): LIPASE, AMYLASE in the last 168 hours. No results for input(s): AMMONIA in the last 168 hours. Coagulation Profile: Recent Labs  Lab 03/27/19 1045  INR 1.1   Cardiac Enzymes: Recent Labs  Lab 03/27/19 1045  TROPONINI 0.04*   BNP (last 3 results) No results for input(s): PROBNP in the last 8760 hours. HbA1C: No results for input(s): HGBA1C in the last 72 hours. CBG: No results for input(s): GLUCAP in the last 168 hours. Lipid Profile: No results for input(s): CHOL, HDL, LDLCALC, TRIG, CHOLHDL, LDLDIRECT in the last 72 hours. Thyroid Function Tests: No results for input(s): TSH, T4TOTAL, FREET4, T3FREE, THYROIDAB in the last 72 hours. Anemia Panel: Recent Labs    03/27/19 1045  FERRITIN 166   Urine analysis:    Component Value Date/Time   COLORURINE Yellow 03/25/2014 2000   APPEARANCEUR Clear 03/25/2014 2000   LABSPEC 1.016 03/25/2014 2000   PHURINE 5.0 03/25/2014 2000   GLUCOSEU Negative 03/25/2014 2000   HGBUR Negative 03/25/2014 2000   BILIRUBINUR Negative 03/25/2014 2000   KETONESUR Negative 03/25/2014 2000   PROTEINUR Negative 03/25/2014 2000   NITRITE Negative 03/25/2014 2000   LEUKOCYTESUR Negative 03/25/2014 2000    Radiological Exams on Admission: Dg Chest Port 1 View  Result Date: 03/27/2019 CLINICAL DATA:  Shortness of breath and poor oxygen saturation. Positive coronavirus test. EXAM: PORTABLE CHEST 1 VIEW COMPARISON:  12/09/2013 FINDINGS: Artifact overlies the chest. The heart is enlarged. There appears to be  pulmonary venous hypertension. There is abnormal interstitial lung density. There is an effusion on the left. No dense consolidation. This radiographic appearance could be due to congestive heart failure. However, due to the history of positive virus test, there certainly could be coexistence viral pneumonia. IMPRESSION: Findings most suggestive of congestive heart failure with pulmonary edema and left effusion. Coexistent viral pneumonia not excluded. Electronically Signed   By: Paulina FusiMark  Shogry M.D.   On: 03/27/2019 11:45    EKG: Independently reviewed.  Atrial fibrillation with a heart rate of 117.  He has since converted to sinus rhythm.  Will repeat EKG.  Assessment/Plan Active Problems:   Acute on chronic diastolic CHF (congestive heart failure) (HCC)   Acute on chronic respiratory failure with hypoxia (HCC)   COVID-19 virus infection   HTN (hypertension)   BPH (benign prostatic hyperplasia)   HLD (hyperlipidemia)   CHF exacerbation (HCC)     1. Acute respiratory  failure with hypoxia.  Likely multifactorial, related to COVID-19 infection as well as CHF exacerbation.  Will transition off oxygen as tolerated. 2. Acute on chronic diastolic congestive heart failure.  Last echocardiogram done in 2015, will update.  Started on intravenous Lasix.  Monitor intake and output.  Hold ACE inhibitor right now in setting of hold off on beta-blocker due to acute decompensation.   3. COVID-19 virus infection.  Follow CRP, d-dimer, ferritin.  If they start to trend up, may benefit from Actemra.  Currently ferritin is normal at 166. 4. BPH.  Continue on Hytrin and finasteride  5. hypertension.  Follow-up serial blood pressures.  Hold lisinopril for now.  Hopefully blood pressure should improve as patient diuresis. 6. Hyperlipidemia.  Continue on statin 7. Behavioral issues.  Patient's niece reports that is not had a formal psychiatric evaluation/diagnosis, but takes Depakote for behavioral issues.  We will  continue the same.  DVT prophylaxis: Lovenox Code Status: DNR Family Communication: Discussed with his niece, Corrie Dandy who is his power of attorney Disposition Plan: Discharge to nursing home once stable Consults called:   Admission status: Inpatient, telemetry  Erick Blinks MD [facility]   If 7PM-7AM, please contact night-coverage [email or web address]   03/27/2019, 5:30 PM

## 2019-03-27 NOTE — Progress Notes (Signed)
CODE SEPSIS - PHARMACY COMMUNICATION  **Broad Spectrum Antibiotics should be administered within 1 hour of Sepsis diagnosis**  Time Code Sepsis Called/Page Received: 10:56  Antibiotics Ordered: None, patient is Covid positive  Time of 1st antibiotic administration: N/A   Additional action taken by pharmacy: message sent to provider at 11:45 to ask about antibiotics  If necessary, Name of Provider/Nurse Contacted: Paduchowski    Foye Deer ,PharmD Clinical Pharmacist  03/27/2019  11:50 AM

## 2019-03-27 NOTE — ED Provider Notes (Signed)
[facility] Emergency Department Provider Note  Time seen: 10:57 AM  I have reviewed the triage vital signs and the nursing notes.   HISTORY  Chief Complaint Shortness of Breath   HPI Sherran NeedsJohn W Quinonez is a 68 y.o. male with a past medical history of hypertension, BPH, CHF, generalized weakness, COVID positive, presents to the emergency department for shortness of breath and hypoxia.  According to EMS report patient is from [facility] where there is currently a corona outbreak, patient tested positive for corona virus last week.  Today patient was noted to have increased shortness of breath and hypoxic in the upper 80s on room air.  Patient satting 87 to 88% per EMS on room air placed on 2 L via nasal cannula and satting in the low to mid 90s.  Patient does have occasional cough during exam.  He is awake and alert oriented x3.  Patient denies any significant shortness of breath at this time but does state lower extremity discomfort which is somewhat worse than normal.   No past medical history on file.  There are no active problems to display for this patient.   Prior to Admission medications   Not on File    No Known Allergies  No family history on file.  Social History Social History   Tobacco Use  . Smoking status: Not on file  Substance Use Topics  . Alcohol use: Not on file  . Drug use: Not on file    Review of Systems Constitutional: Negative for fever.  Positive for generalized weakness. Cardiovascular: Negative for chest pain. Respiratory: Negative for shortness of breath.  Positive for cough. Gastrointestinal: Negative for abdominal pain Musculoskeletal: Positive for lower extremity discomfort/swelling. Skin: Negative for skin complaints  Neurological: Negative for headache All other ROS negative  ____________________________________________   PHYSICAL EXAM:  VITAL SIGNS: ED Triage Vitals  Enc Vitals Group     BP 03/27/19 1043  113/77     Pulse Rate 03/27/19 1043 (!) 116     Resp 03/27/19 1043 (!) 26     Temp 03/27/19 1043 98.7 F (37.1 C)     Temp Source 03/27/19 1043 Oral     SpO2 03/27/19 1043 97 %     Weight --      Height --      Head Circumference --      Peak Flow --      Pain Score 03/27/19 1054 0     Pain Loc --      Pain Edu? --      Excl. in GC? --    Constitutional: Patient is awake and alert, no acute distress Eyes: Normal exam ENT      Head: Normocephalic and atraumatic.      Mouth/Throat: Mucous membranes are moist. Cardiovascular: Regular rhythm rate around 120 bpm. Respiratory: Normal respiratory effort without tachypnea nor retractions. Breath sounds are clear.  Occasional cough during exam. Gastrointestinal: Soft and nontender. No distention.  Musculoskeletal: Nontender with normal range of motion in all extremities.  2+ lower extremity edema, equal bilaterally.  Mild erythema to bilateral lower extremities. Neurologic:  Normal speech and language. No gross focal neurologic deficits Skin:  Skin is warm, dry and intact.  Psychiatric: Mood and affect are normal.   ____________________________________________    EKG EKG viewed and interpreted by myself shows atrial fibrillation at 117 bpm with a narrow QRS, normal axis, largely normal intervals, nonspecific but no concerning ST changes.  ____________________________________________  RADIOLOGY   IMPRESSION: Findings most suggestive of congestive heart failure with pulmonary edema and left effusion. Coexistent viral pneumonia not excluded.  ____________________________________________   INITIAL IMPRESSION / ASSESSMENT AND PLAN / ED COURSE  Pertinent labs & imaging results that were available during my care of the patient were reviewed by me and considered in my medical decision making (see chart for details).   Patient presents to the emergency department from [facility], tested positive for corona virus last week.   Patient found to be short of breath this morning and hypoxic in the 80s.  Patient denies any baseline O2 requirement.  87% on room air per EMS.  Placed on nasal cannula oxygen currently satting in the mid 90s.  Given the patient's oxygen requirement with COVID positive status we will check labs, cultures, chest x-ray.  We will plan to admit to the hospital once his work-up is complete.  Patient will need to be transferred to [facility] given his COVID positive status.  Chest x-ray shows CHF possibly with viral pneumonia.  Patient is a known positive chronic patient however chest x-ray does appear consistent with CHF.  He also has lower extremity edema.  We will dose 60 mg of IV Lasix.  I have discussed the patient with [facility], they have accepted the patient for a transfer.  JALIK PHARO was evaluated in Emergency Department on 03/27/2019 for the symptoms described in the history of present illness. He was evaluated in the context of the global COVID-19 pandemic, which necessitated consideration that the patient might be at risk for infection with the SARS-CoV-2 virus that causes COVID-19. Institutional protocols and algorithms that pertain to the evaluation of patients at risk for COVID-19 are in a state of rapid change based on information released by regulatory bodies including the CDC and federal and state organizations. These policies and algorithms were followed during the patient's care in the ED.  ____________________________________________   FINAL CLINICAL IMPRESSION(S) / ED DIAGNOSES  COVID-19 Hypoxia CHF exacerbation   Minna Antis, MD 03/27/19 1322

## 2019-03-27 NOTE — ED Notes (Signed)
Attempted to call Report to [facility]. RN not available at this time and will call back.

## 2019-03-28 ENCOUNTER — Inpatient Hospital Stay (HOSPITAL_COMMUNITY): Payer: Medicaid Other

## 2019-03-28 DIAGNOSIS — E785 Hyperlipidemia, unspecified: Secondary | ICD-10-CM

## 2019-03-28 DIAGNOSIS — I4891 Unspecified atrial fibrillation: Secondary | ICD-10-CM | POA: Diagnosis not present

## 2019-03-28 DIAGNOSIS — J9601 Acute respiratory failure with hypoxia: Secondary | ICD-10-CM

## 2019-03-28 LAB — D-DIMER, QUANTITATIVE: D-Dimer, Quant: 1.35 ug/mL-FEU — ABNORMAL HIGH (ref 0.00–0.50)

## 2019-03-28 LAB — COMPREHENSIVE METABOLIC PANEL
ALT: 24 U/L (ref 0–44)
AST: 43 U/L — ABNORMAL HIGH (ref 15–41)
Albumin: 2.6 g/dL — ABNORMAL LOW (ref 3.5–5.0)
Alkaline Phosphatase: 60 U/L (ref 38–126)
Anion gap: 7 (ref 5–15)
BUN: 19 mg/dL (ref 8–23)
CO2: 33 mmol/L — ABNORMAL HIGH (ref 22–32)
Calcium: 8.1 mg/dL — ABNORMAL LOW (ref 8.9–10.3)
Chloride: 100 mmol/L (ref 98–111)
Creatinine, Ser: 0.97 mg/dL (ref 0.61–1.24)
GFR calc Af Amer: >60 mL/min (ref >60)
GFR calc non Af Amer: >60 mL/min (ref >60)
Glucose, Bld: 94 mg/dL (ref 70–99)
Potassium: 4.3 mmol/L (ref 3.5–5.1)
Sodium: 140 mmol/L (ref 135–145)
Total Bilirubin: 0.5 mg/dL (ref 0.3–1.2)
Total Protein: 7.1 g/dL (ref 6.5–8.1)

## 2019-03-28 LAB — BLOOD CULTURE ID PANEL (REFLEXED)

## 2019-03-28 LAB — CBC WITH DIFFERENTIAL/PLATELET
Abs Immature Granulocytes: 0.02 10*3/uL (ref 0.00–0.07)
Basophils Absolute: 0 10*3/uL (ref 0.0–0.1)
Basophils Relative: 1 %
Eosinophils Absolute: 0.1 10*3/uL (ref 0.0–0.5)
Eosinophils Relative: 1 %
HCT: 38.9 % — ABNORMAL LOW (ref 39.0–52.0)
Hemoglobin: 11.6 g/dL — ABNORMAL LOW (ref 13.0–17.0)
Immature Granulocytes: 1 %
Lymphocytes Relative: 16 %
Lymphs Abs: 0.7 10*3/uL (ref 0.7–4.0)
MCH: 28.7 pg (ref 26.0–34.0)
MCHC: 29.8 g/dL — ABNORMAL LOW (ref 30.0–36.0)
MCV: 96.3 fL (ref 80.0–100.0)
Monocytes Absolute: 0.4 10*3/uL (ref 0.1–1.0)
Monocytes Relative: 10 %
Neutro Abs: 3.1 10*3/uL (ref 1.7–7.7)
Neutrophils Relative %: 71 %
Platelets: 175 10*3/uL (ref 150–400)
RBC: 4.04 MIL/uL — ABNORMAL LOW (ref 4.22–5.81)
RDW: 14.9 % (ref 11.5–15.5)
WBC: 4.2 10*3/uL (ref 4.0–10.5)
nRBC: 0 % (ref 0.0–0.2)

## 2019-03-28 LAB — FERRITIN: Ferritin: 160 ng/mL (ref 24–336)

## 2019-03-28 LAB — MAGNESIUM: Magnesium: 2 mg/dL (ref 1.7–2.4)

## 2019-03-28 LAB — HIV ANTIBODY (ROUTINE TESTING W REFLEX): HIV Screen 4th Generation wRfx: NONREACTIVE

## 2019-03-28 LAB — C-REACTIVE PROTEIN: CRP: 15.7 mg/dL — ABNORMAL HIGH (ref <1.0)

## 2019-03-28 MED ORDER — ENOXAPARIN SODIUM 80 MG/0.8ML ~~LOC~~ SOLN
80.0000 mg | Freq: Once | SUBCUTANEOUS | Status: AC
  Administered 2019-03-28: 01:00:00 80 mg via SUBCUTANEOUS
  Filled 2019-03-28: qty 0.8

## 2019-03-28 MED ORDER — SODIUM CHLORIDE 0.9 % IV SOLN
2.0000 g | Freq: Three times a day (TID) | INTRAVENOUS | Status: DC
  Administered 2019-03-28 – 2019-03-29 (×2): 2 g via INTRAVENOUS
  Filled 2019-03-28 (×3): qty 2

## 2019-03-28 MED ORDER — DILTIAZEM HCL-DEXTROSE 100-5 MG/100ML-% IV SOLN (PREMIX)
5.0000 mg/h | INTRAVENOUS | Status: DC
  Filled 2019-03-28: qty 100

## 2019-03-28 MED ORDER — ENOXAPARIN SODIUM 120 MG/0.8ML ~~LOC~~ SOLN
120.0000 mg | Freq: Two times a day (BID) | SUBCUTANEOUS | Status: DC
  Administered 2019-03-28 – 2019-03-29 (×2): 120 mg via SUBCUTANEOUS
  Filled 2019-03-28 (×2): qty 0.8

## 2019-03-28 MED ORDER — DILTIAZEM LOAD VIA INFUSION
10.0000 mg | Freq: Once | INTRAVENOUS | Status: DC
  Filled 2019-03-28: qty 10

## 2019-03-28 MED ORDER — VANCOMYCIN HCL 10 G IV SOLR
2000.0000 mg | Freq: Once | INTRAVENOUS | Status: AC
  Administered 2019-03-28: 22:00:00 2000 mg via INTRAVENOUS
  Filled 2019-03-28: qty 2000

## 2019-03-28 MED ORDER — DILTIAZEM HCL 100 MG IV SOLR
5.0000 mg/h | INTRAVENOUS | Status: DC
  Administered 2019-03-28: 19:00:00 10 mg/h via INTRAVENOUS
  Administered 2019-03-28: 08:00:00 5 mg/h via INTRAVENOUS
  Administered 2019-03-29: 05:00:00 10 mg/h via INTRAVENOUS
  Filled 2019-03-28 (×5): qty 100

## 2019-03-28 MED ORDER — VANCOMYCIN HCL IN DEXTROSE 750-5 MG/150ML-% IV SOLN
750.0000 mg | Freq: Two times a day (BID) | INTRAVENOUS | Status: DC
  Filled 2019-03-28: qty 150

## 2019-03-28 MED ORDER — ENOXAPARIN SODIUM 120 MG/0.8ML ~~LOC~~ SOLN: 120.0000 mg | Freq: Two times a day (BID) | SUBCUTANEOUS | Status: DC

## 2019-03-28 MED ORDER — DILTIAZEM LOAD VIA INFUSION
10.0000 mg | Freq: Once | INTRAVENOUS | Status: AC
  Administered 2019-03-28: 08:00:00 10 mg via INTRAVENOUS
  Filled 2019-03-28: qty 10

## 2019-03-28 MED ORDER — LEVALBUTEROL TARTRATE 45 MCG/ACT IN AERO
2.0000 | INHALATION_SPRAY | Freq: Four times a day (QID) | RESPIRATORY_TRACT | Status: DC | PRN
  Administered 2019-03-30 (×2): 2 via RESPIRATORY_TRACT
  Filled 2019-03-28: qty 15

## 2019-03-28 NOTE — Progress Notes (Signed)
PROGRESS NOTE    Dwayne ADRIANO  Bridges:[id number] DOB: [birth year] DOA: 03/27/2019 PCP: Patient, [name] Per    Brief Narrative:  68 year old male was brought to  regional from nursing home with shortness of breath and hypoxia.  He was recently found to have COVID-19 infection approximately 1 week prior.  Work-up in the emergency room indicated volume overload with decompensated CHF.  Patient was transferred to [facility] for further management.   Assessment & Plan:   Active Problems:   Acute on chronic diastolic CHF (congestive heart failure) (HCC)   Acute on chronic respiratory failure with hypoxia (HCC)   COVID-19 virus infection   HTN (hypertension)   BPH (benign prostatic hyperplasia)   HLD (hyperlipidemia)   CHF exacerbation (HCC)   Atrial fibrillation with RVR (HCC)   1. Acute respiratory failure with hypoxia.  Possibly related to pneumonia as well as CHF.  Titrate off oxygen as tolerated. 2. Acute on chronic diastolic congestive heart failure.  Fair urine output with intravenous Lasix.  Continues to have significant volume overload.  Continue current treatments. 3. COVID-19 virus infection.  CRP is elevated, but ferritin is normal.  D-dimer mildly elevated at 1.3.  Will hold off on Actemra for now.  Treat supportively. 4. Healthcare associated pneumonia.  Noted on chest x-ray.  He does have low-grade fever.  We will start the patient on antibiotics for now. 5. BPH.  Continue on Hytrin and finasteride. 6. Hyperlipidemia.  Continue statin 7. Rapid atrial fibrillation, new onset.  Likely precipitated by underlying infection.  Started on Cardizem infusion for heart control.  Will transition to oral therapy.  He is anticoagulated with Lovenox.  Chads vas score of 3 8. Behavioral issues.  Continue on Depakote.  Appears stable.   DVT prophylaxis: Lovenox Code Status: DNR Family Communication: Left message for niece Disposition Plan: Return to skilled nursing  facility when improved   Consultants:     Procedures:     Antimicrobials:   Vancomycin 5/16 >  Cefepime 5/16 >   Subjective: Patient feels his breathing is doing a little better today.  Denies any cough.  Objective: Vitals:   03/28/19 1200 03/28/19 1300 03/28/19 1400 03/28/19 1513  BP: 106/65 109/75 (!) 108/95   Pulse: 98 79 93   Resp: (!) 30 (!) 31 (!) 26   Temp:    98.6 F (37 C)  TempSrc:    Oral  SpO2: 96% 95% 93%   Weight:      Height:        Intake/Output Summary (Last 24 hours) at 03/28/2019 1946 Last data filed at 03/28/2019 1836 Gross per 24 hour  Intake 2005.3 ml  Output 1551 ml  Net 454.3 ml   Filed Weights   03/28/19 0500  Weight: 112.3 kg    Examination:  General exam: Appears calm and comfortable  Respiratory system: Crackles at bases. Respiratory effort normal. Cardiovascular system: S1 & S2 heard, irregular. No JVD, murmurs, rubs, gallops or clicks. 1+ pedal edema. Gastrointestinal system: Abdomen is nondistended, soft and nontender. No organomegaly or masses felt. Normal bowel sounds heard. Central nervous system: Alert and oriented. No focal neurological deficits. Extremities: Symmetric 5 x 5 power. Skin: venous stasis changes Psychiatry: pleasant, cooperative    Data Reviewed: I have personally reviewed following labs and imaging studies  CBC: Recent Labs  Lab 03/27/19 1045 03/27/19 1810 03/28/19 0105  WBC 4.9 4.9 4.2  NEUTROABS 3.9  --  3.1  HGB 11.2* 11.7* 11.6*  HCT  36.5* 39.3 38.9*  MCV 93.8 96.6 96.3  PLT 182 181 175   Basic Metabolic Panel: Recent Labs  Lab 03/27/19 1045 03/27/19 1810 03/28/19 0105  NA 137  --  140  K 4.3  --  4.3  CL 102  --  100  CO2 26  --  33*  GLUCOSE 160*  --  94  BUN 19  --  19  CREATININE 1.05 1.00 0.97  CALCIUM 7.9*  --  8.1*  MG  --   --  2.0   GFR: Estimated Creatinine Clearance: 83.4 mL/min (by C-G formula based on SCr of 0.97 mg/dL). Liver Function Tests: Recent Labs   Lab 03/27/19 1045 03/28/19 0105  AST 40 43*  ALT 20 24  ALKPHOS 54 60  BILITOT 0.8 0.5  PROT 6.6 7.1  ALBUMIN 2.5* 2.6*   No results for input(s): LIPASE, AMYLASE in the last 168 hours. No results for input(s): AMMONIA in the last 168 hours. Coagulation Profile: Recent Labs  Lab 03/27/19 1045  INR 1.1   Cardiac Enzymes: Recent Labs  Lab 03/27/19 1045  TROPONINI 0.04*   BNP (last 3 results) No results for input(s): PROBNP in the last 8760 hours. HbA1C: No results for input(s): HGBA1C in the last 72 hours. CBG: No results for input(s): GLUCAP in the last 168 hours. Lipid Profile: No results for input(s): CHOL, HDL, LDLCALC, TRIG, CHOLHDL, LDLDIRECT in the last 72 hours. Thyroid Function Tests: No results for input(s): TSH, T4TOTAL, FREET4, T3FREE, THYROIDAB in the last 72 hours. Anemia Panel: Recent Labs    03/27/19 1045 03/28/19 0105  FERRITIN 166 160   Sepsis Labs: Recent Labs  Lab 03/27/19 1046 03/27/19 1810  PROCALCITON  --  <0.10  LATICACIDVEN 1.3  --     Recent Results (from the past 240 hour(s))  Blood Culture (routine x 2)     Status: None (Preliminary result)   Collection Time: 03/27/19 10:45 AM  Result Value Ref Range Status   Specimen Description BLOOD LEFT ANTECUBITAL  Final   Special Requests   Final    BOTTLES DRAWN AEROBIC AND ANAEROBIC Blood Culture adequate volume   Culture  Setup Time   Final    Organism ID to follow GRAM POSITIVE COCCI AEROBIC BOTTLE ONLY CRITICAL RESULT CALLED TO, READ BACK BY AND VERIFIED WITHBea Laura: JENNA BULLINS AT [id number] 03/28/2019 SDR Performed at [facility], [address] [place], [place] [address]    Culture GRAM POSITIVE COCCI  Final   Report Status PENDING  Incomplete  Blood Culture (routine x 2)     Status: None (Preliminary result)   Collection Time: 03/27/19 10:45 AM  Result Value Ref Range Status   Specimen Description BLOOD BLOOD RIGHT HAND  Final   Special Requests   Final    BOTTLES DRAWN  AEROBIC AND ANAEROBIC Blood Culture adequate volume   Culture   Final    NO GROWTH < 24 HOURS Performed at [facility], [address] [place], [place] [address]    Report Status PENDING  Incomplete  Blood Culture ID Panel (Reflexed)     Status: Abnormal   Collection Time: 03/27/19 10:45 AM  Result Value Ref Range Status   Enterococcus species NOT DETECTED NOT DETECTED Final   Listeria monocytogenes NOT DETECTED NOT DETECTED Final   Staphylococcus species DETECTED (A) NOT DETECTED Final    Comment: Methicillin (oxacillin) resistant coagulase negative staphylococcus. Possible blood culture contaminant (unless isolated from more than one blood culture draw or  clinical case suggests pathogenicity). No antibiotic treatment is indicated for blood  culture contaminants. CRITICAL RESULT CALLED TO, READ BACK BY AND VERIFIED WITH:  JENNA BULLINS AT 2423 03/28/2019 SDR    Staphylococcus aureus (BCID) NOT DETECTED NOT DETECTED Final   Methicillin resistance DETECTED (A) NOT DETECTED Final    Comment: CRITICAL RESULT CALLED TO, READ BACK BY AND VERIFIED WITH:  JENNA BULLINS AT [id number] 03/28/2019 SDR    Streptococcus species NOT DETECTED NOT DETECTED Final   Streptococcus agalactiae NOT DETECTED NOT DETECTED Final   Streptococcus pneumoniae NOT DETECTED NOT DETECTED Final   Streptococcus pyogenes NOT DETECTED NOT DETECTED Final   Acinetobacter baumannii NOT DETECTED NOT DETECTED Final   Enterobacteriaceae species NOT DETECTED NOT DETECTED Final   Enterobacter cloacae complex NOT DETECTED NOT DETECTED Final   Escherichia coli NOT DETECTED NOT DETECTED Final   Klebsiella oxytoca NOT DETECTED NOT DETECTED Final   Klebsiella pneumoniae NOT DETECTED NOT DETECTED Final   Proteus species NOT DETECTED NOT DETECTED Final   Serratia marcescens NOT DETECTED NOT DETECTED Final   Haemophilus influenzae NOT DETECTED NOT DETECTED Final   Neisseria meningitidis NOT DETECTED NOT DETECTED Final    Pseudomonas aeruginosa NOT DETECTED NOT DETECTED Final   Candida albicans NOT DETECTED NOT DETECTED Final   Candida glabrata NOT DETECTED NOT DETECTED Final   Candida krusei NOT DETECTED NOT DETECTED Final   Candida parapsilosis NOT DETECTED NOT DETECTED Final   Candida tropicalis NOT DETECTED NOT DETECTED Final    Comment: Performed at [facility], [address] [place], [place] [address]         Radiology Studies: Dg Chest Port 1 View  Result Date: 03/28/2019 CLINICAL DATA:  Acute respiratory failure with hypoxia EXAM: PORTABLE CHEST 1 VIEW COMPARISON:  03/27/2019 FINDINGS: Mild patchy left upper and lower lobe opacities, suspicious for pneumonia. No frank interstitial edema on the current study. Suspected small left pleural effusion. No pneumothorax. The heart is top-normal in size. IMPRESSION: Mild patchy left upper and lower lobe opacities, suspicious for pneumonia. Suspected small left pleural effusion. No frank interstitial edema on the current study. Electronically Signed   By: Charline Bills M.D.   On: 03/28/2019 08:14   Dg Chest Port 1 View  Result Date: 03/27/2019 CLINICAL DATA:  Shortness of breath and poor oxygen saturation. Positive coronavirus test. EXAM: PORTABLE CHEST 1 VIEW COMPARISON:  12/09/2013 FINDINGS: Artifact overlies the chest. The heart is enlarged. There appears to be pulmonary venous hypertension. There is abnormal interstitial lung density. There is an effusion on the left. No dense consolidation. This radiographic appearance could be due to congestive heart failure. However, due to the history of positive virus test, there certainly could be coexistence viral pneumonia. IMPRESSION: Findings most suggestive of congestive heart failure with pulmonary edema and left effusion. Coexistent viral pneumonia not excluded. Electronically Signed   By: Paulina Fusi M.D.   On: 03/27/2019 11:45        Scheduled Meds: . aspirin EC  81 mg Oral Daily  .  atorvastatin  20 mg Oral Daily  . divalproex  375 mg Oral BID  . enoxaparin (LOVENOX) injection  120 mg Subcutaneous Q12H  . finasteride  5 mg Oral Daily  . furosemide  60 mg Intravenous BID  . potassium chloride  40 mEq Oral BID  . senna-docusate  1 tablet Oral BID  . sodium chloride flush  3 mL Intravenous Q12H  . terazosin  5 mg Oral QHS  . traZODone  50 mg Oral QHS   Continuous Infusions: . sodium chloride    . ceFEPime (MAXIPIME) IV    . diltiazem (CARDIZEM) infusion 10 mg/hr (03/28/19 1848)  . vancomycin    . [START ON 03/29/2019] vancomycin       LOS: 1 day    Time spent:    Erick Blinks, MD [facility]   If 7PM-7AM, please contact night-coverage [email or web address]  03/28/2019, 7:46 PM

## 2019-03-28 NOTE — Progress Notes (Signed)
CRITICAL VALUE ALERT  Critical Value: Microbiology Gram Positive Cocci blood culture.  Date & Time Notied:  03/28/19 0650  Provider Notified: [facility] On [facility] (Paged)  Orders Received/Actions taken: MD notified Microbiology results available in Epic. Handoff communication to day shift RN awaiting further instruction.

## 2019-03-28 NOTE — Progress Notes (Addendum)
0811  HR 120.  Cardizem 10 mg bolus given.  Cardizem drip started at 5mg /hr.  BP 118/79  0821  HR 111.  BP 104/72  0849  HR 115.  BP 120/97.  Cardizem increased to 10mg /hr.  0933  HR 104.  BP 97/60.  Cardizem at 10mg /hr.  Will leave at this rate at this time d/t BP and HR.  Patient keeps removing oxygen.  O2 sats decrease to mid 80's on room air.

## 2019-03-28 NOTE — Progress Notes (Signed)
Pharmacy Antibiotic Note  Dwayne Bridges is a 68 y.o. male admitted on 03/27/2019 with pneumonia.  Pharmacy has been consulted for vancomycin and cefepime dosing. Pt transferred to [facility] 5/15 after testing positive for COVID-19.   Plan:  Cefepime 2 gr IV q8h    Vancomycin 2000 mg IV x 1, then vancomycin 750 mg IV q12h   Monitor clinical course, renal function, cultures as available   Height: 5\' 6"  (167.6 cm) Weight: 247 lb 8 oz (112.3 kg) IBW/kg (Calculated) : 63.8  Temp (24hrs), Avg:98.6 F (37 C), Min:98.4 F (36.9 C), Max:98.7 F (37.1 C)  Recent Labs  Lab 03/27/19 1045 03/27/19 1046 03/27/19 1810 03/28/19 0105  WBC 4.9  --  4.9 4.2  CREATININE 1.05  --  1.00 0.97  LATICACIDVEN  --  1.3  --   --     Estimated Creatinine Clearance: 83.4 mL/min (by C-G formula based on SCr of 0.97 mg/dL).    No Known Allergies  Antimicrobials this admission:  5/16 vancomycin >>  5/16 cefepime >>   Dose adjustments this admission:    Microbiology results: 5/15 BCx: MRSA 5/15 HIV antibody: negative    Thank you for allowing pharmacy to be a part of this patient's care.   Adalberto Cole, PharmD, BCPS 03/28/2019 7:36 PM

## 2019-03-28 NOTE — Progress Notes (Signed)
PT Cancellation Note  Patient Details Name: [name] MRN: [id number] DOB: 1951/02/15   Cancelled Treatment:    Reason Eval/Treat Not Completed: Medical issues which prohibited therapy   Noted elevated HR, lower BP with cardizem being adjusted. Will hold off on PT at this time (RN made aware). Potentially can see later today as pt can tolerate and PT census. If not, pt will be seen Monday.   Scherrie November Almer [place], PT 03/28/2019, 10:28 AM

## 2019-03-28 NOTE — Plan of Care (Signed)
  Problem: Education: Goal: Knowledge of risk factors and measures for prevention of condition will improve Outcome: Not Progressing Note:  Due to cognitive abilities.  Doesn't fully understand disease   Problem: Respiratory: Goal: Complications related to the disease process, condition or treatment will be avoided or minimized Outcome: Progressing   Problem: Respiratory: Goal: Will maintain a patent airway Outcome: Progressing   Problem: Coping: Goal: Psychosocial and spiritual needs will be supported Outcome: Progressing

## 2019-03-28 NOTE — Plan of Care (Signed)
  Problem: Education: Goal: Knowledge of General Education information will improve Description Including pain rating scale, medication(s)/side effects and non-pharmacologic comfort measures Outcome: Progressing   Problem: Clinical Measurements: Goal: Ability to maintain clinical measurements within normal limits will improve Outcome: Progressing Goal: Diagnostic test results will improve Outcome: Progressing Goal: Respiratory complications will improve Outcome: Progressing   Problem: Activity: Goal: Risk for activity intolerance will decrease Outcome: Progressing   Problem: Nutrition: Goal: Adequate nutrition will be maintained Outcome: Progressing   Problem: Coping: Goal: Level of anxiety will decrease Outcome: Progressing   Problem: Elimination: Goal: Will not experience complications related to bowel motility Outcome: Progressing Goal: Will not experience complications related to urinary retention Outcome: Progressing   Problem: Pain Managment: Goal: General experience of comfort will improve Outcome: Progressing   Problem: Safety: Goal: Ability to remain free from injury will improve Outcome: Progressing   Problem: Skin Integrity: Goal: Risk for impaired skin integrity will decrease Outcome: Progressing   Problem: Health Behavior/Discharge Planning: Goal: Ability to manage health-related needs will improve Outcome: Not Progressing Note:  dependent   Problem: Clinical Measurements: Goal: Cardiovascular complication will be avoided Outcome: Not Progressing Note:  On cardizem drip for afib

## 2019-03-29 ENCOUNTER — Inpatient Hospital Stay (HOSPITAL_COMMUNITY): Payer: Medicaid Other

## 2019-03-29 DIAGNOSIS — R0602 Shortness of breath: Secondary | ICD-10-CM

## 2019-03-29 LAB — CBC WITH DIFFERENTIAL/PLATELET
Abs Immature Granulocytes: 0.02 10*3/uL (ref 0.00–0.07)
Basophils Absolute: 0 10*3/uL (ref 0.0–0.1)
Basophils Relative: 1 %
Eosinophils Absolute: 0.1 10*3/uL (ref 0.0–0.5)
Eosinophils Relative: 2 %
HCT: 35.6 % — ABNORMAL LOW (ref 39.0–52.0)
Hemoglobin: 10.7 g/dL — ABNORMAL LOW (ref 13.0–17.0)
Immature Granulocytes: 1 %
Lymphocytes Relative: 21 %
Lymphs Abs: 0.8 10*3/uL (ref 0.7–4.0)
MCH: 28.8 pg (ref 26.0–34.0)
MCHC: 30.1 g/dL (ref 30.0–36.0)
MCV: 96 fL (ref 80.0–100.0)
Monocytes Absolute: 0.4 10*3/uL (ref 0.1–1.0)
Monocytes Relative: 11 %
Neutro Abs: 2.5 10*3/uL (ref 1.7–7.7)
Neutrophils Relative %: 64 %
Platelets: 164 10*3/uL (ref 150–400)
RBC: 3.71 MIL/uL — ABNORMAL LOW (ref 4.22–5.81)
RDW: 14.9 % (ref 11.5–15.5)
WBC: 3.7 10*3/uL — ABNORMAL LOW (ref 4.0–10.5)
nRBC: 0 % (ref 0.0–0.2)

## 2019-03-29 LAB — COMPREHENSIVE METABOLIC PANEL
ALT: 26 U/L (ref 0–44)
AST: 44 U/L — ABNORMAL HIGH (ref 15–41)
Albumin: 2.5 g/dL — ABNORMAL LOW (ref 3.5–5.0)
Alkaline Phosphatase: 56 U/L (ref 38–126)
Anion gap: 8 (ref 5–15)
BUN: 21 mg/dL (ref 8–23)
CO2: 30 mmol/L (ref 22–32)
Calcium: 7.9 mg/dL — ABNORMAL LOW (ref 8.9–10.3)
Chloride: 99 mmol/L (ref 98–111)
Creatinine, Ser: 0.85 mg/dL (ref 0.61–1.24)
GFR calc Af Amer: >60 mL/min (ref >60)
GFR calc non Af Amer: >60 mL/min (ref >60)
Glucose, Bld: 92 mg/dL (ref 70–99)
Potassium: 4.9 mmol/L (ref 3.5–5.1)
Sodium: 137 mmol/L (ref 135–145)
Total Bilirubin: 0.8 mg/dL (ref 0.3–1.2)
Total Protein: 6.8 g/dL (ref 6.5–8.1)

## 2019-03-29 LAB — C-REACTIVE PROTEIN: CRP: 13.6 mg/dL — ABNORMAL HIGH (ref <1.0)

## 2019-03-29 LAB — D-DIMER, QUANTITATIVE: D-Dimer, Quant: 0.9 ug/mL-FEU — ABNORMAL HIGH (ref 0.00–0.50)

## 2019-03-29 LAB — MAGNESIUM: Magnesium: 2.1 mg/dL (ref 1.7–2.4)

## 2019-03-29 LAB — PROCALCITONIN: Procalcitonin: <0.1 ng/mL

## 2019-03-29 LAB — FERRITIN: Ferritin: 164 ng/mL (ref 24–336)

## 2019-03-29 MED ORDER — DOXYCYCLINE HYCLATE 100 MG PO TABS
100.0000 mg | ORAL_TABLET | Freq: Two times a day (BID) | ORAL | Status: DC
  Administered 2019-03-29 – 2019-03-31 (×5): 100 mg via ORAL
  Filled 2019-03-29 (×7): qty 1

## 2019-03-29 MED ORDER — FUROSEMIDE 10 MG/ML IJ SOLN
60.0000 mg | Freq: Every day | INTRAMUSCULAR | Status: DC
  Administered 2019-03-30 – 2019-03-31 (×2): 60 mg via INTRAVENOUS
  Filled 2019-03-29 (×2): qty 6

## 2019-03-29 MED ORDER — APIXABAN 5 MG PO TABS
5.0000 mg | ORAL_TABLET | Freq: Two times a day (BID) | ORAL | Status: DC
  Administered 2019-03-29 – 2019-03-31 (×4): 5 mg via ORAL
  Filled 2019-03-29 (×4): qty 1

## 2019-03-29 MED ORDER — DILTIAZEM HCL 90 MG PO TABS
90.0000 mg | ORAL_TABLET | Freq: Three times a day (TID) | ORAL | Status: DC
  Administered 2019-03-29 – 2019-03-31 (×6): 90 mg via ORAL
  Filled 2019-03-29 (×7): qty 1

## 2019-03-29 MED ORDER — POTASSIUM CHLORIDE CRYS ER 20 MEQ PO TBCR: 20.0000 meq | EXTENDED_RELEASE_TABLET | Freq: Every day | ORAL | Status: DC

## 2019-03-29 NOTE — Progress Notes (Signed)
ANTICOAGULATION CONSULT NOTE - Initial Consult  Pharmacy Consult for Lovenox > apixaban Indication: atrial fibrillation  No Known Allergies  Patient Measurements: Height: 5\' 6"  (167.6 cm) Weight: 247 lb 8 oz (112.3 kg) IBW/kg (Calculated) : 63.8  Vital Signs: Temp: 98.4 F (36.9 C) (05/17 0313) Temp Source: Oral (05/17 0313) BP: 116/73 (05/17 0700) Pulse Rate: 88 (05/17 0700)  Labs: Recent Labs    03/27/19 1045 03/27/19 1810 03/28/19 0105 03/29/19 0323  HGB 11.2* 11.7* 11.6* 10.7*  HCT 36.5* 39.3 38.9* 35.6*  PLT 182 181 175 164  LABPROT 14.2  --   --   --   INR 1.1  --   --   --   CREATININE 1.05 1.00 0.97 0.85  TROPONINI 0.04*  --   --   --     Estimated Creatinine Clearance: 95.2 mL/min (by C-G formula based on SCr of 0.85 mg/dL).   Medical History: Past Medical History:  Diagnosis Date  . CHF (congestive heart failure) (HCC)   . Hypertension     Medications:  Medications Prior to Admission  Medication Sig Dispense Refill Last Dose  . acetaminophen (TYLENOL) 325 MG tablet Take 650 mg by mouth every 6 (six) hours as needed (leg pain).   03/27/2019 at 0922  . atorvastatin (LIPITOR) 20 MG tablet Take 20 mg by mouth daily.   03/26/2019 at 2200  . cephALEXin (KEFLEX) 500 MG capsule Take 500 mg by mouth 2 (two) times daily.   03/27/2019 at 1000  . divalproex (DEPAKOTE SPRINKLE) 125 MG capsule Take 375 mg by mouth 2 (two) times daily.   03/27/2019 at 1000  . finasteride (PROSCAR) 5 MG tablet Take 5 mg by mouth daily.   03/27/2019 at 1000  . furosemide (LASIX) 40 MG tablet Take 40 mg by mouth daily.   03/27/2019 at 1000  . lisinopril (ZESTRIL) 20 MG tablet Take 20 mg by mouth daily.   03/27/2019 at 1000  . Multiple Vitamin (MULTIVITAMIN) tablet Take 1 tablet by mouth daily.     . potassium chloride (K-DUR) 10 MEQ tablet Take 20 mEq by mouth daily.   03/26/2019 at 1200  . sennosides-docusate sodium (SENOKOT-S) 8.6-50 MG tablet Take 1 tablet by mouth 2 (two) times daily.    03/27/2019 at 1000  . terazosin (HYTRIN) 10 MG capsule Take 10 mg by mouth at bedtime.   03/26/2019 at 2100  . traZODone (DESYREL) 50 MG tablet Take 50 mg by mouth at bedtime.   03/26/2019 at 2100    Assessment: Dwayne Bridges here with COVID developed new onset Afib. Currently rate controlled on diltiazem and receiving therapeutic lovenox. Pharmacy consulted to transition patient to apixaban. Patient received a dose of Lovenox 120 mg this AM. H/H trending down. Plt wnl. SCr 0.85, Age < 80, Wt > 60 kg   Goal of Therapy:  Stroke prevention Monitor platelets by anticoagulation protocol: Yes   Plan:  -Stop Lovenox -Start apixaban 5 mg twice daily. First dose tonight at 2200  -Monitor renal fx, CBC and s/s of bleeding  Vinnie Level, PharmD., BCPS Clinical Pharmacist Clinical phone for 03/29/19 until 5pm: [phone]

## 2019-03-29 NOTE — Progress Notes (Signed)
PROGRESS NOTE                                                                                                                                                                                                             Patient Demographics:    Dwayne Bridges, is a 68 y.o. male, DOB - Jan 03, 1951, ZOX:[id number]  Outpatient Primary MD for the patient is Patient, [name] Per    LOS - 2  Admit date - 03/27/2019    No chief complaint on file.      Brief Narrative  68 year old male was brought to [facility] from [facility] home with shortness of breath and hypoxia.  He was recently found to have COVID-19 infection approximately 1 week prior.  Work-up in the emergency room indicated volume overload with decompensated CHF.  Patient was transferred to [facility] for further management.   Subjective:    Dwayne Bridges today has, No headache, No chest pain, No abdominal pain - No Nausea, No new weakness tingling or numbness, no Cough - SOB.    Assessment  & Plan :     1. Acute Hypoxic Resp. Failure due to Acute Covid 19 Viral Illness during the ongoing 2020 Covid 19 Pandemic with some element of acute on chronic diastolic CHF-COVID-19 infection seems to have stabilized inflammatory markers are stable, continue monitoring with supportive care and titrate down oxygen.  Currently on room air.   COVID-19 Labs  Recent Labs    03/27/19 1045 03/27/19 1810 03/28/19 0105 03/29/19 0323  DDIMER  --  1.74* 1.35* 0.90*  FERRITIN 166  --  160 164  LDH 217*  --   --   --   CRP  --  14.5* 15.7* 13.6*    No results found for: SARSCOV2NAA      Component Value Date/Time   BNP 168.0 (H) 03/27/2019 1045     2.  Acute on chronic diastolic CHF EF 60% on last echocardiogram-much improved with diuresis with IV Lasix, dose adjusted continue to monitor electrolytes and renal function.  CHF much improved.  3.  Paroxysmal atrial  fibrillation with RVR.  [place] vas 2 score of at least 2.  Rate controlled with IV Cardizem transition to oral, switch from heparin drip to Eliquis and monitor.  Stop aspirin.  Echocardiogram and follow with cardiology.  Will check  baseline TSH.  4.  BPH.  On alpha-blocker continue.  5.  Dyslipidemia.  On statin.  6.  Lower extremity cellulitis with a small shallow right lower extremity ulcer.  Minimal to no infection, minimal warmth, transition to oral doxycycline from meropenem.  Will stop in 4 days from today.  7.  Underlying dementia.  Some element of delirium from time to time.  Stable on Depakote.      Condition - Extremely Guarded  Family Communication  :  None  Code Status :  DNR  Diet : Regular  Disposition Plan  :  SNF  Consults  :  None  Procedures  :    PUD Prophylaxis : PPI  DVT Prophylaxis  :    Heparin/ Eliquis  Lab Results  Component Value Date   PLT 164 03/29/2019    Inpatient Medications  Scheduled Meds: . aspirin EC  81 mg Oral Daily  . atorvastatin  20 mg Oral Daily  . divalproex  375 mg Oral BID  . enoxaparin (LOVENOX) injection  120 mg Subcutaneous Q12H  . finasteride  5 mg Oral Daily  . furosemide  60 mg Intravenous BID  . potassium chloride  40 mEq Oral BID  . senna-docusate  1 tablet Oral BID  . sodium chloride flush  3 mL Intravenous Q12H  . terazosin  5 mg Oral QHS  . traZODone  50 mg Oral QHS   Continuous Infusions: . sodium chloride    . diltiazem (CARDIZEM) infusion 10 mg/hr (03/29/19 0437)   PRN Meds:.sodium chloride, acetaminophen, acetaminophen, levalbuterol, ondansetron (ZOFRAN) IV, sodium chloride flush  Antibiotics  :    Anti-infectives (From admission, onward)   Start     Dose/Rate Route Frequency Ordered Stop   03/29/19 0800  vancomycin (VANCOCIN) IVPB 750 mg/150 ml premix  Status:  Discontinued     750 mg 150 mL/hr over 60 Minutes Intravenous Every 12 hours 03/28/19 1925 03/29/19 0736   03/28/19 2000  vancomycin  (VANCOCIN) 2,000 mg in sodium chloride 0.9 % 500 mL IVPB     2,000 mg 250 mL/hr over 120 Minutes Intravenous  Once 03/28/19 1925 03/28/19 2336   03/28/19 2000  ceFEPIme (MAXIPIME) 2 g in sodium chloride 0.9 % 100 mL IVPB  Status:  Discontinued     2 g 200 mL/hr over 30 Minutes Intravenous Every 8 hours 03/28/19 1925 03/29/19 0736       Time Spent in minutes  30   Susa Raring M.D on 03/29/2019 at 10:42 AM  To page go to [email or web address] - password [facility]  [facility] -  Office  [phone]   See all Orders from today for further details    Objective:   Vitals:   03/29/19 0200 03/29/19 0300 03/29/19 0313 03/29/19 0700  BP: 111/71  125/76 116/73  Pulse: (!) 102 85 88 88  Resp: (!) 26 (!) 32 (!) 34 18  Temp: 98.4 F (36.9 C)  98.4 F (36.9 C)   TempSrc: Oral  Oral   SpO2: 96% 92% 93% (!) 89%  Weight:      Height:        Wt Readings from Last 3 Encounters:  03/28/19 112.3 kg  03/27/19 124.1 kg     Intake/Output Summary (Last 24 hours) at 03/29/2019 1042 Last data filed at 03/29/2019 0903 Gross per 24 hour  Intake 1045.3 ml  Output 1551 ml  Net -505.7 ml     Physical Exam  Awake Alert,  No new F.N deficits, Normal  affect [place].AT,PERRAL Supple Neck,No JVD, No cervical lymphadenopathy appriciated.  Symmetrical Chest wall movement, Good air movement bilaterally, CTAB RRR,No Gallops,Rubs or new Murmurs, No Parasternal Heave +ve B.Sounds, Abd Soft, No tenderness, No organomegaly appriciated, No rebound - guarding or rigidity. No Cyanosis, chronic desquamation of both legs R>L, small superficial grade 2 R leg ulcer     Data Review:    CBC Recent Labs  Lab 03/27/19 1045 03/27/19 1810 03/28/19 0105 03/29/19 0323  WBC 4.9 4.9 4.2 3.7*  HGB 11.2* 11.7* 11.6* 10.7*  HCT 36.5* 39.3 38.9* 35.6*  PLT 182 181 175 164  MCV 93.8 96.6 96.3 96.0  MCH 28.8 28.7 28.7 28.8  MCHC 30.7 29.8* 29.8* 30.1  RDW 15.0 14.9 14.9 14.9  LYMPHSABS 0.6*  --  0.7 0.8  MONOABS  0.4  --  0.4 0.4  EOSABS 0.0  --  0.1 0.1  BASOSABS 0.0  --  0.0 0.0    Chemistries  Recent Labs  Lab 03/27/19 1045 03/27/19 1810 03/28/19 0105 03/29/19 0323  NA 137  --  140 137  K 4.3  --  4.3 4.9  CL 102  --  100 99  CO2 26  --  33* 30  GLUCOSE 160*  --  94 92  BUN 19  --  19 21  CREATININE 1.05 1.00 0.97 0.85  CALCIUM 7.9*  --  8.1* 7.9*  MG  --   --  2.0 2.1  AST 40  --  43* 44*  ALT 20  --  24 26  ALKPHOS 54  --  60 56  BILITOT 0.8  --  0.5 0.8   ------------------------------------------------------------------------------------------------------------------ No results for input(s): CHOL, HDL, LDLCALC, TRIG, CHOLHDL, LDLDIRECT in the last 72 hours.  No results found for: HGBA1C ------------------------------------------------------------------------------------------------------------------ No results for input(s): TSH, T4TOTAL, T3FREE, THYROIDAB in the last 72 hours.  Invalid input(s): FREET3  Cardiac Enzymes Recent Labs  Lab 03/27/19 1045  TROPONINI 0.04*   ------------------------------------------------------------------------------------------------------------------    Component Value Date/Time   BNP 168.0 (H) 03/27/2019 1045    Micro Results Recent Results (from the past 240 hour(s))  Blood Culture (routine x 2)     Status: None (Preliminary result)   Collection Time: 03/27/19 10:45 AM  Result Value Ref Range Status   Specimen Description   Final    BLOOD LEFT ANTECUBITAL Performed at [facility], [address] [place], [place] [address]    Special Requests   Final    BOTTLES DRAWN AEROBIC AND ANAEROBIC Blood Culture adequate volume Performed at [facility], [address] [place], [place] [address]    Culture  Setup Time   Final    GRAM POSITIVE COCCI AEROBIC BOTTLE ONLY CRITICAL RESULT CALLED TO, READ BACK BY AND VERIFIED WITHEileen Stanford BULLINS AT 0981 03/28/2019 SDR Performed at [facility], [address], [place], [place] [address]    Culture GRAM POSITIVE COCCI  Final   Report Status PENDING  Incomplete  Blood Culture (routine x 2)     Status: None (Preliminary result)   Collection Time: 03/27/19 10:45 AM  Result Value Ref Range Status   Specimen Description BLOOD BLOOD RIGHT HAND  Final   Special Requests   Final    BOTTLES DRAWN AEROBIC AND ANAEROBIC Blood Culture adequate volume   Culture   Final    NO GROWTH 2 DAYS Performed at [facility], [address], [place], [place] [address]    Report Status PENDING  Incomplete  Blood Culture ID Panel (Reflexed)     Status: Abnormal   Collection Time: 03/27/19 10:45 AM  Result Value Ref Range Status   Enterococcus species NOT DETECTED NOT DETECTED Final   Listeria monocytogenes NOT DETECTED NOT DETECTED Final   Staphylococcus species DETECTED (A) NOT DETECTED Final    Comment: Methicillin (oxacillin) resistant coagulase negative staphylococcus. Possible blood culture contaminant (unless isolated from more than one blood culture draw or clinical case suggests pathogenicity). No antibiotic treatment is indicated for blood  culture contaminants. CRITICAL RESULT CALLED TO, READ BACK BY AND VERIFIED WITH:  JENNA BULLINS AT 5956 03/28/2019 SDR    Staphylococcus aureus (BCID) NOT DETECTED NOT DETECTED Final   Methicillin resistance DETECTED (A) NOT DETECTED Final    Comment: CRITICAL RESULT CALLED TO, READ BACK BY AND VERIFIED WITH:  JENNA BULLINS AT 3875 03/28/2019 SDR    Streptococcus species NOT DETECTED NOT DETECTED Final   Streptococcus agalactiae NOT DETECTED NOT DETECTED Final   Streptococcus pneumoniae NOT DETECTED NOT DETECTED Final   Streptococcus pyogenes NOT DETECTED NOT DETECTED Final   Acinetobacter baumannii NOT DETECTED NOT DETECTED Final   Enterobacteriaceae species NOT DETECTED NOT DETECTED Final   Enterobacter cloacae complex NOT DETECTED NOT DETECTED Final   Escherichia coli NOT DETECTED NOT DETECTED Final    Klebsiella oxytoca NOT DETECTED NOT DETECTED Final   Klebsiella pneumoniae NOT DETECTED NOT DETECTED Final   Proteus species NOT DETECTED NOT DETECTED Final   Serratia marcescens NOT DETECTED NOT DETECTED Final   Haemophilus influenzae NOT DETECTED NOT DETECTED Final   Neisseria meningitidis NOT DETECTED NOT DETECTED Final   Pseudomonas aeruginosa NOT DETECTED NOT DETECTED Final   Candida albicans NOT DETECTED NOT DETECTED Final   Candida glabrata NOT DETECTED NOT DETECTED Final   Candida krusei NOT DETECTED NOT DETECTED Final   Candida parapsilosis NOT DETECTED NOT DETECTED Final   Candida tropicalis NOT DETECTED NOT DETECTED Final    Comment: Performed at [facility], [address] [place], [place] [address]    Radiology Reports Dg Chest Port 1 View  Result Date: 03/29/2019 CLINICAL DATA:  Shortness of breath EXAM: PORTABLE CHEST 1 VIEW COMPARISON:  03/28/2019 FINDINGS: Mild patchy opacities in the lungs bilaterally, left upper lobe predominant, compatible multifocal pneumonia. Mild blunting of the left costophrenic angle. No pneumothorax. Cardiomegaly.  Thoracic aortic atherosclerosis. IMPRESSION: Multifocal pneumonia, left upper lobe predominant. Overall appearance is grossly unchanged. Electronically Signed   By: Charline Bills M.D.   On: 03/29/2019 08:22   Dg Chest Port 1 View  Result Date: 03/28/2019 CLINICAL DATA:  Acute respiratory failure with hypoxia EXAM: PORTABLE CHEST 1 VIEW COMPARISON:  03/27/2019 FINDINGS: Mild patchy left upper and lower lobe opacities, suspicious for pneumonia. No frank interstitial edema on the current study. Suspected small left pleural effusion. No pneumothorax. The heart is top-normal in size. IMPRESSION: Mild patchy left upper and lower lobe opacities, suspicious for pneumonia. Suspected small left pleural effusion. No frank interstitial edema on the current study. Electronically Signed   By: Charline Bills M.D.   On: 03/28/2019 08:14    Dg Chest Port 1 View  Result Date: 03/27/2019 CLINICAL DATA:  Shortness of breath and poor oxygen saturation. Positive coronavirus test. EXAM: PORTABLE CHEST 1 VIEW COMPARISON:  12/09/2013 FINDINGS: Artifact overlies the chest. The heart is enlarged. There appears to be pulmonary venous hypertension. There is abnormal interstitial lung density. There is an effusion on the left. No dense consolidation. This radiographic appearance could be  due to congestive heart failure. However, due to the history of positive virus test, there certainly could be coexistence viral pneumonia. IMPRESSION: Findings most suggestive of congestive heart failure with pulmonary edema and left effusion. Coexistent viral pneumonia not excluded. Electronically Signed   By: Paulina Fusi M.D.   On: 03/27/2019 11:45

## 2019-03-30 ENCOUNTER — Inpatient Hospital Stay (HOSPITAL_COMMUNITY): Payer: Medicaid Other

## 2019-03-30 LAB — COMPREHENSIVE METABOLIC PANEL
ALT: 31 U/L (ref 0–44)
AST: 45 U/L — ABNORMAL HIGH (ref 15–41)
Albumin: 2.6 g/dL — ABNORMAL LOW (ref 3.5–5.0)
Alkaline Phosphatase: 61 U/L (ref 38–126)
Anion gap: 8 (ref 5–15)
BUN: 21 mg/dL (ref 8–23)
CO2: 32 mmol/L (ref 22–32)
Calcium: 8.4 mg/dL — ABNORMAL LOW (ref 8.9–10.3)
Chloride: 96 mmol/L — ABNORMAL LOW (ref 98–111)
Creatinine, Ser: 0.83 mg/dL (ref 0.61–1.24)
GFR calc Af Amer: >60 mL/min (ref >60)
GFR calc non Af Amer: >60 mL/min (ref >60)
Glucose, Bld: 121 mg/dL — ABNORMAL HIGH (ref 70–99)
Potassium: 4.5 mmol/L (ref 3.5–5.1)
Sodium: 136 mmol/L (ref 135–145)
Total Bilirubin: 0.6 mg/dL (ref 0.3–1.2)
Total Protein: 7.3 g/dL (ref 6.5–8.1)

## 2019-03-30 LAB — CBC WITH DIFFERENTIAL/PLATELET
Abs Immature Granulocytes: 0.06 10*3/uL (ref 0.00–0.07)
Basophils Absolute: 0 10*3/uL (ref 0.0–0.1)
Basophils Relative: 1 %
Eosinophils Absolute: 0 10*3/uL (ref 0.0–0.5)
Eosinophils Relative: 1 %
HCT: 37 % — ABNORMAL LOW (ref 39.0–52.0)
Hemoglobin: 11.1 g/dL — ABNORMAL LOW (ref 13.0–17.0)
Immature Granulocytes: 1 %
Lymphocytes Relative: 16 %
Lymphs Abs: 0.8 10*3/uL (ref 0.7–4.0)
MCH: 28.6 pg (ref 26.0–34.0)
MCHC: 30 g/dL (ref 30.0–36.0)
MCV: 95.4 fL (ref 80.0–100.0)
Monocytes Absolute: 0.4 10*3/uL (ref 0.1–1.0)
Monocytes Relative: 8 %
Neutro Abs: 3.5 10*3/uL (ref 1.7–7.7)
Neutrophils Relative %: 73 %
Platelets: 182 10*3/uL (ref 150–400)
RBC: 3.88 MIL/uL — ABNORMAL LOW (ref 4.22–5.81)
RDW: 14.7 % (ref 11.5–15.5)
WBC: 4.8 10*3/uL (ref 4.0–10.5)
nRBC: 0 % (ref 0.0–0.2)

## 2019-03-30 LAB — CULTURE, BLOOD (ROUTINE X 2): Special Requests: ADEQUATE

## 2019-03-30 LAB — TSH: TSH: 0.795 u[IU]/mL (ref 0.350–4.500)

## 2019-03-30 LAB — FERRITIN: Ferritin: 182 ng/mL (ref 24–336)

## 2019-03-30 LAB — C-REACTIVE PROTEIN: CRP: 11.3 mg/dL — ABNORMAL HIGH (ref <1.0)

## 2019-03-30 LAB — BRAIN NATRIURETIC PEPTIDE: B Natriuretic Peptide: 98.1 pg/mL (ref 0.0–100.0)

## 2019-03-30 LAB — MAGNESIUM: Magnesium: 2.1 mg/dL (ref 1.7–2.4)

## 2019-03-30 LAB — PROCALCITONIN: Procalcitonin: <0.1 ng/mL

## 2019-03-30 LAB — D-DIMER, QUANTITATIVE: D-Dimer, Quant: 0.83 ug/mL-FEU — ABNORMAL HIGH (ref 0.00–0.50)

## 2019-03-30 MED ORDER — TRAZODONE HCL 50 MG PO TABS
50.0000 mg | ORAL_TABLET | Freq: Every day | ORAL | Status: DC
  Administered 2019-03-30 – 2019-03-31 (×2): 50 mg via ORAL
  Filled 2019-03-30 (×3): qty 1

## 2019-03-30 MED ORDER — HALOPERIDOL LACTATE 5 MG/ML IJ SOLN: 1.0000 mg | Freq: Four times a day (QID) | INTRAMUSCULAR | Status: DC | PRN

## 2019-03-30 NOTE — Progress Notes (Signed)
Called Lennart Pall (niece) and updated her on patient status. Provided number for call back if necessary.

## 2019-03-30 NOTE — Progress Notes (Signed)
Pt SpO2 dropping into 70's & 80's intermittently; pt was noted to be pulling O2 [place] off at those times, When  placed back on pt, SpO2 still only reaching high 80's- low 90's, so O2 increased to 3L. SpO2 remained the same, so after several minutes O2 increased to 4L. Pt now 93% but remains tachypneic, RR 25-30/min, and a more frequent dry cough noted. Lung sounds diminshed with expiratory wheezes throughout, no crackles noted. Pt has also observed experiencing interkittent confusion/forgetfulness, and restlessness, actually attempted to climb OOB x2 but could not say why he was trying to get up. Pt also has had 2 large loose stools that were yellow/brown in color and very foul smelling. Pt cleaned thoroughly, repositioned, & on call provider notified of above incidents. New orders received, Pt resting comfortably at this time, NAD.

## 2019-03-30 NOTE — TOC Initial Note (Signed)
Transition of Care [facility]) - Initial/Assessment Note    Patient Details  Name: Dwayne Bridges MRN: [id number] Date of Birth: [birth year]  Transition of Care [facility]) CM/SW Contact:    Gildardo Griffes, [place] Phone Number: [phone] 03/30/2019, 3:43 PM  Clinical Narrative:                  CSW consulted with patient's niece Corrie Dandy for discharge planning. She is in agreement with patient returning to [facility] when medically table to discharge. Corrie Dandy asks to be updated on when this occurs, CSW will update when patient is ready for discharge. No questions or concerns from [place] at this time.     Expected Discharge Plan: Skilled Nursing Facility Barriers to Discharge: Continued Medical Work up   Patient Goals and CMS Choice   CMS Medicare.gov Compare Post Acute Care list provided to:: Patient Represenative (must comment)(Mary (niece)) Choice offered to / list presented to : (niece [facility])  Expected Discharge Plan and Services Expected Discharge Plan: Skilled Nursing Facility     Post Acute Care Choice: Skilled Nursing Facility Living arrangements for the past 2 months: Skilled Nursing Facility                                      Prior Living Arrangements/Services Living arrangements for the past 2 months: Skilled Nursing Facility Lives with:: Self Patient language and need for interpreter reviewed:: Yes Do you feel safe going back to the place where you live?: Yes      Need for Family Participation in Patient Care: Yes (Comment) Care giver support system in place?: Yes (comment)   Criminal Activity/Legal Involvement Pertinent to Current Situation/Hospitalization: No - Comment as needed  Activities of Daily Living Home Assistive Devices/Equipment: Wheelchair ADL Screening (condition at time of admission) Patient's cognitive ability adequate to safely complete daily activities?: Yes Is the patient deaf or have difficulty hearing?: No Does the patient have difficulty  seeing, even when wearing glasses/contacts?: No Does the patient have difficulty concentrating, remembering, or making decisions?: Yes Patient able to express need for assistance with ADLs?: Yes Does the patient have difficulty dressing or bathing?: Yes Independently performs ADLs?: No Communication: Independent Dressing (OT): Dependent Is this a change from baseline?: Pre-admission baseline Grooming: Dependent Is this a change from baseline?: Pre-admission baseline Feeding: Independent Is this a change from baseline?: Pre-admission baseline Bathing: Dependent Is this a change from baseline?: Pre-admission baseline Toileting: Needs assistance Is this a change from baseline?: Pre-admission baseline In/Out Bed: Needs assistance Is this a change from baseline?: Pre-admission baseline Walks in Home: Dependent Is this a change from baseline?: Pre-admission baseline Does the patient have difficulty walking or climbing stairs?: Yes Weakness of Legs: Both Weakness of Arms/Hands: Both  Permission Sought/Granted Permission sought to share information with : Case Manager, Magazine features editor, Family Supports Permission granted to share information with : Yes, Verbal Permission Granted  Share Information with NAME: Corrie Dandy  Permission granted to share info w AGENCY: SNFs  Permission granted to share info w Relationship: niece  Permission granted to share info w Contact Information: [phone]  Emotional Assessment Appearance:: Other (Comment Required(unable to assess - remote) Attitude/Demeanor/Rapport: Unable to Assess Affect (typically observed): Unable to Assess Orientation: : Oriented to Self, Oriented to  Time, Oriented to Place, Oriented to Situation Alcohol / Substance Use: Not Applicable Psych Involvement: No (comment)  Admission diagnosis:  Covid Positive Patient  Active Problem List   Diagnosis Date Noted  . Atrial fibrillation with RVR (HCC) 03/28/2019  . Acute on  chronic diastolic CHF (congestive heart failure) (HCC) 03/27/2019  . Acute on chronic respiratory failure with hypoxia (HCC) 03/27/2019  . COVID-19 virus infection 03/27/2019  . HTN (hypertension) 03/27/2019  . BPH (benign prostatic hyperplasia) 03/27/2019  . HLD (hyperlipidemia) 03/27/2019  . CHF exacerbation (HCC) 03/27/2019   PCP:  Patient, [name] Per Pharmacy:  No Pharmacies Listed    Social Determinants of Health (SDOH) Interventions    Readmission Risk Interventions No flowsheet data found.

## 2019-03-30 NOTE — Progress Notes (Signed)
Gave teaching on incentive spirometry use, patient verbalized understanding and attempted to use, however he needs reinforcement to take slower breaths.

## 2019-03-30 NOTE — Progress Notes (Signed)
CSW acknowledges SNF consult, patient from [facility], anticipate to return at discharge when medically stable.   [place], [place] [phone]

## 2019-03-30 NOTE — NC FL2 (Signed)
[place] MEDICAID FL2 LEVEL OF CARE SCREENING TOOL     IDENTIFICATION  Patient Name: Dwayne Bridges Birthdate: 04-Jun-1951 Sex: male Admission Date (Current Location): 03/27/2019  [facility] and [place] Number:  [employer] and Address:  [facility], [address], [place], [place] [address]      Provider Number: [id number]  Attending Physician Name and Address:  Leroy Sea, MD  Relative Name and Phone Number:  Corrie Dandy (niece) [phone]    Current Level of Care: Hospital Recommended Level of Care: Skilled Nursing Facility Prior Approval Number:    Date Approved/Denied:   PASRR Number: [id number]  Discharge Plan: SNF    Current Diagnoses: Patient Active Problem List   Diagnosis Date Noted  . Atrial fibrillation with RVR (HCC) 03/28/2019  . Acute on chronic diastolic CHF (congestive heart failure) (HCC) 03/27/2019  . Acute on chronic respiratory failure with hypoxia (HCC) 03/27/2019  . COVID-19 virus infection 03/27/2019  . HTN (hypertension) 03/27/2019  . BPH (benign prostatic hyperplasia) 03/27/2019  . HLD (hyperlipidemia) 03/27/2019  . CHF exacerbation (HCC) 03/27/2019    Orientation RESPIRATION BLADDER Height & Weight     Self, Time, Situation, Place  O2(nasal cannula 1 L/min) Incontinent, External catheter Weight: 252 lb 9.6 oz (114.6 kg) Height:  5\' 6"  (167.6 cm)  BEHAVIORAL SYMPTOMS/MOOD NEUROLOGICAL BOWEL NUTRITION STATUS      Incontinent Diet(see discharge summary)  AMBULATORY STATUS COMMUNICATION OF NEEDS Skin   Extensive Assist Verbally Other (Comment)(blister legs, cellulitis legs, cracking legs, MASD buttocks, leg, groin)                       Personal Care Assistance Level of Assistance  Bathing, Feeding, Dressing, Total care Bathing Assistance: Maximum assistance Feeding assistance: Limited assistance(needs set up) Dressing Assistance: Maximum assistance Total Care Assistance: Maximum assistance    Functional Limitations Info  Sight, Hearing, Speech Sight Info: Adequate Hearing Info: Adequate Speech Info: Adequate    SPECIAL CARE FACTORS FREQUENCY  PT (By licensed PT), OT (By licensed OT)     PT Frequency: min 5x weekly OT Frequency: min 5x weekly            Contractures Contractures Info: Not present    Additional Factors Info  Code Status, Allergies, Isolation Precautions Code Status Info: DNR Allergies Info: No known allergies     Isolation Precautions Info: Emerging pathogen, air and contact precautions     Current Medications (03/30/2019):  This is the current hospital active medication list Current Facility-Administered Medications  Medication Dose Route Frequency Provider Last Rate Last Dose  . acetaminophen (TYLENOL) tablet 650 mg  650 mg Oral Q6H PRN Erick Blinks, MD      . apixaban (ELIQUIS) tablet 5 mg  5 mg Oral BID Sampson Si, RPH   5 mg at 03/30/19 1105  . atorvastatin (LIPITOR) tablet 20 mg  20 mg Oral Daily Erick Blinks, MD   20 mg at 03/30/19 1105  . diltiazem (CARDIZEM) tablet 90 mg  90 mg Oral Q8H Leroy Sea, MD   90 mg at 03/30/19 1322  . divalproex (DEPAKOTE SPRINKLE) capsule 375 mg  375 mg Oral BID Erick Blinks, MD   375 mg at 03/30/19 1107  . doxycycline (VIBRA-TABS) tablet 100 mg  100 mg Oral Q12H Leroy Sea, MD   100 mg at 03/30/19 1107  . finasteride (PROSCAR) tablet 5 mg  5 mg Oral Daily Erick Blinks, MD   5  mg at 03/30/19 1106  . furosemide (LASIX) injection 60 mg  60 mg Intravenous Daily Leroy SeaSingh, Prashant K, MD   60 mg at 03/30/19 1113  . levalbuterol [facility](XOPENEX HFA) inhaler 2 puff  2 puff Inhalation Q6H PRN Drema DallasWoods, Curtis J, MD   2 puff at 03/30/19 1123  . ondansetron (ZOFRAN) injection 4 mg  4 mg Intravenous Q6H PRN Erick BlinksMemon, Jehanzeb, MD      . terazosin (HYTRIN) capsule 5 mg  5 mg Oral QHS Drema DallasWoods, Curtis J, MD   5 mg at 03/29/19 2138  . traZODone (DESYREL) tablet 50 mg  50 mg Oral Daily Leroy SeaSingh, Prashant K,  MD         Discharge Medications: Please see discharge summary for a list of discharge medications.  Relevant Imaging Results:  Relevant Lab Results:   Additional Information SSN: [id number]  Gildardo GriffesAshley M Cherrell Maybee, LCSW

## 2019-03-30 NOTE — Discharge Instructions (Addendum)
Follow with Primary MD  in 7 days   Get CBC, CMP, 2 view Chest X ray -  checked  by Primary MD or SNF MD in 5-7 days   Activity: As tolerated with Full fall precautions use walker/cane & assistance as needed  Disposition SNF  Diet: Heart Healthy  with feeding assistance and aspiration precautions. 1.5 L/day total fluid restriction.  Special Instructions: If you have smoked or chewed Tobacco  in the last 2 yrs please stop smoking, stop any regular Alcohol  and or any Recreational drug use.  On your next visit with your primary care physician please Get Medicines reviewed and adjusted.  Please request your Prim.MD to go over all Hospital Tests and Procedure/Radiological results at the follow up, please get all Hospital records sent to your Prim MD by signing hospital release before you go home.  If you experience worsening of your admission symptoms, develop shortness of breath, life threatening emergency, suicidal or homicidal thoughts you must seek medical attention immediately by calling 911 or calling your MD immediately  if symptoms less severe.  You Must read complete instructions/literature along with all the possible adverse reactions/side effects for all the Medicines you take and that have been prescribed to you. Take any new Medicines after you have completely understood and accpet all the possible adverse reactions/side effects.       Person Under Monitoring Name: Dwayne Bridges  Location: [address] [place] [id number]   CORONAVIRUS DISEASE 2019 (COVID-19) Guidance for Persons Under Investigation You are being tested for the virus that causes coronavirus disease 2019 (COVID-19). Public health actions are necessary to ensure protection of your health and the health of others, and to prevent further spread of infection. COVID-19 is caused by a virus that can cause symptoms, such as fever, cough, and shortness of breath. The primary transmission from person to person is  by coughing or sneezing. On December 11, 2018, the World Health Organization announced a [employer] Emergency of International Concern and on December 12, 2018 the U.S. Department of Health and Human Services declared a public health emergency. If the virus that causesCOVID-19 spreads in the community, it could have severe public health consequences.  As a person under investigation for COVID-19, the [place] [place] Department of Health and [employer], Division of [employer] advises you to adhere to the following guidance until your test results are reported to you. If your test result is positive, you will receive additional information from your provider and your local health department at that time.   Remain at home until you are cleared by your health provider or public health authorities.   Keep a log of visitors to your home using the form provided. Any visitors to your home must be aware of your isolation status.  If you plan to move to a new address or leave the county, notify the local health department in your county.  Call a doctor or seek care if you have an urgent medical need. Before seeking medical care, call ahead and get instructions from the provider before arriving at the medical office, clinic or hospital. Notify them that you are being tested for the virus that causes COVID-19 so arrangements can be made, as necessary, to prevent transmission to others in the healthcare setting. Next, notify the local health department in your county.  If a medical emergency arises and you need to call 911, inform the first responders that you are being tested for the  virus that causes COVID-19. Next, notify the local health department in your county.  Adhere to all guidance set forth by the [place] Division of [employer] for [facility] of patients that is based on guidance from the Center for Disease Control and Prevention with suspected or confirmed COVID-19. It is provided with  this guidance for Persons Under Investigation.  Your health and the health of our community are our top priorities. Public Health officials remain available to provide assistance and counseling to you about COVID-19 and compliance with this guidance.  Provider: ____________________________________________________________ Date: ______/_____/_________  By signing below, you acknowledge that you have read and agree to comply with this Guidance for Persons Under Investigation. ______________________________________________________________ Date: ______/_____/_________  WHO DO I CALL? You can find a list of local health departments here: [email or web address] Health Department: ____________________________________________________________________ Contact Name: ________________________________________________________________________ Telephone: ___________________________________________________________________________  [facility], Division of Public Health, Communicable Disease Branch COVID-19 Guidance for Persons Under Investigation January 17, 2019  Information on my medicine - ELIQUIS (apixaban)  This medication education was reviewed with me or my healthcare representative as part of my discharge preparation.  The pharmacist that spoke with me during my hospital stay was:  Lavonia Dana, [facility]  Why was Eliquis prescribed for you? Eliquis was prescribed for you to reduce the risk of forming blood clots that can cause a stroke if you have a medical condition called atrial fibrillation (a type of irregular heartbeat) OR to reduce the risk of a blood clots forming after orthopedic surgery.  What do You need to know about Eliquis ? Take your Eliquis TWICE DAILY - one tablet in the morning and one tablet in the evening with or without food.  It would be best to take the doses about the same time each day.  If you have difficulty  swallowing the tablet whole please discuss with your pharmacist how to take the medication safely.  Take Eliquis exactly as prescribed by your doctor and DO NOT stop taking Eliquis without talking to the doctor who prescribed the medication.  Stopping may increase your risk of developing a new clot or stroke.  Refill your prescription before you run out.  After discharge, you should have regular check-up appointments with your healthcare provider that is prescribing your Eliquis.  In the future your dose may need to be changed if your kidney function or weight changes by a significant amount or as you get older.  What do you do if you miss a dose? If you miss a dose, take it as soon as you remember on the same day and resume taking twice daily.  Do not take more than one dose of ELIQUIS at the same time.  Important Safety Information A possible side effect of Eliquis is bleeding. You should call your healthcare provider right away if you experience any of the following: ? Bleeding from an injury or your nose that does not stop. ? Unusual colored urine (red or dark brown) or unusual colored stools (red or black). ? Unusual bruising for unknown reasons. ? A serious fall or if you hit your head (even if there is no bleeding).  Some medicines may interact with Eliquis and might increase your risk of bleeding or clotting while on Eliquis. To help avoid this, consult your healthcare provider or pharmacist prior to using any new prescription or non-prescription medications, including herbals, vitamins, non-steroidal anti-inflammatory drugs (NSAIDs) and supplements.  This website has more information on Eliquis (apixaban): www.FlightPolice.com.cy.

## 2019-03-30 NOTE — Progress Notes (Signed)
PROGRESS NOTE                                                                                                                                                                                                             Patient Demographics:    Dwayne Bridges, is a 68 y.o. male, DOB - July 02, 1951, ZOX:[id number]  Outpatient Primary MD for the patient is Patient, [name] Per    LOS - 3  Admit date - 03/27/2019    No chief complaint on file.      Brief Narrative  68 year old male was brought to [facility] from nursing home with shortness of breath and hypoxia.  He was recently found to have COVID-19 infection approximately 1 week prior.  Work-up in the emergency room indicated volume overload with decompensated CHF.  Patient was transferred to [facility] for further management.   Subjective:   Patient in bed, appears slightly sleepy but comfortable, denies any headache, no fever, no chest pain or pressure, no shortness of breath , no abdominal pain. No focal weakness.     Assessment  & Plan :     1. Acute Hypoxic Resp. Failure due to Acute Covid 19 Viral Illness during the ongoing 2020 Covid 19 Pandemic with some element of acute on chronic diastolic CHF-COVID-19 infection seems to have stabilized inflammatory markers are stable, continue monitoring with supportive care and titrate down oxygen.  Currently on room air when awake, night time drops are likely undiagnosed OSA.   COVID-19 Labs  Recent Labs    03/27/19 1045  03/28/19 0105 03/29/19 0323 03/30/19 0306  DDIMER  --    < > 1.35* 0.90* 0.83*  FERRITIN 166  --  160 164 182  LDH 217*  --   --   --   --   CRP  --    < > 15.7* 13.6* 11.3*   < > = values in this interval not displayed.    No results found for: SARSCOV2NAA      Component Value Date/Time   BNP 168.0 (H) 03/27/2019 1045     2.  Acute on chronic diastolic CHF EF 60% on last  echocardiogram-much improved with diuresis with IV Lasix, dose adjusted continue to monitor electrolytes and renal function. CHF much improved.  3.  Paroxysmal atrial fibrillation with RVR.  [place]  vas 2 score of at least 2.  Rate controlled with IV Cardizem transition to oral, switch from heparin drip to Eliquis and monitor.  Stop aspirin.  Echocardiogram and follow with cardiology. Stable baseline TSH.  4.  BPH.  On alpha-blocker continue.  5.  Dyslipidemia.  On statin.  6.  Lower extremity cellulitis with a small shallow right lower extremity ulcer.  Minimal to no infection, minimal warmth, transition to oral doxycycline from meropenem.  Will stop in 4 days from 03/29/19.  7.  Underlying dementia.  Some element of delirium from time to time.  Stable on Depakote, stop PRN haldol as causing somnolence.      Condition - Extremely Guarded  Family Communication  :  None  Code Status :  DNR  Diet : Regular  Disposition Plan  :  SNF in 1-2 days  Consults  :  None  Procedures  :    PUD Prophylaxis : PPI  DVT Prophylaxis  :   Eliquis  Lab Results  Component Value Date   PLT 182 03/30/2019    Inpatient Medications  Scheduled Meds: . apixaban  5 mg Oral BID  . atorvastatin  20 mg Oral Daily  . diltiazem  90 mg Oral Q8H  . divalproex  375 mg Oral BID  . doxycycline  100 mg Oral Q12H  . finasteride  5 mg Oral Daily  . furosemide  60 mg Intravenous Daily  . terazosin  5 mg Oral QHS  . traZODone  50 mg Oral QHS   Continuous Infusions:  PRN Meds:.acetaminophen, haloperidol lactate, levalbuterol, ondansetron (ZOFRAN) IV  Antibiotics  :    Anti-infectives (From admission, onward)   Start     Dose/Rate Route Frequency Ordered Stop   03/29/19 1100  doxycycline (VIBRA-TABS) tablet 100 mg     100 mg Oral Every 12 hours 03/29/19 1048 04/02/19 0959   03/29/19 0800  vancomycin (VANCOCIN) IVPB 750 mg/150 ml premix  Status:  Discontinued     750 mg 150 mL/hr over 60 Minutes  Intravenous Every 12 hours 03/28/19 1925 03/29/19 0736   03/28/19 2000  vancomycin (VANCOCIN) 2,000 mg in sodium chloride 0.9 % 500 mL IVPB     2,000 mg 250 mL/hr over 120 Minutes Intravenous  Once 03/28/19 1925 03/28/19 2336   03/28/19 2000  ceFEPIme (MAXIPIME) 2 g in sodium chloride 0.9 % 100 mL IVPB  Status:  Discontinued     2 g 200 mL/hr over 30 Minutes Intravenous Every 8 hours 03/28/19 1925 03/29/19 0736       Time Spent in minutes  30   Susa RaringPrashant Singh M.D on 03/30/2019 at 9:54 AM  To page go to [email or web address] - password [email or web address]  [facility] -  Office  [phone]   See all Orders from today for further details    Objective:   Vitals:   03/30/19 0500 03/30/19 0600 03/30/19 0639 03/30/19 0800  BP: 125/71 139/73  112/72  Pulse: 90 86 72 75  Resp: (!) 26 (!) 27 17 18   Temp:    97.9 F (36.6 C)  TempSrc:      SpO2: 95% 97% 94% 96%  Weight: 114.6 kg     Height:        Wt Readings from Last 3 Encounters:  03/30/19 114.6 kg  03/27/19 124.1 kg     Intake/Output Summary (Last 24 hours) at 03/30/2019 0954 Last data filed at 03/30/2019 0700 Gross per 24 hour  Intake 240 ml  Output 400 ml  Net -160 ml     Physical Exam  Sleepy but easily arousable, No new F.N deficits,   [place].AT,PERRAL Supple Neck,No JVD, No cervical lymphadenopathy appriciated.  Symmetrical Chest wall movement, Good air movement bilaterally, CTAB RRR,No Gallops, Rubs or new Murmurs, No Parasternal Heave +ve B.Sounds, Abd Soft, No tenderness, No organomegaly appriciated, No rebound - guarding or rigidity. No Cyanosis, Clubbing or edema, No new Rash or bruise    Data Review:    CBC Recent Labs  Lab 03/27/19 1045 03/27/19 1810 03/28/19 0105 03/29/19 0323 03/30/19 0306  WBC 4.9 4.9 4.2 3.7* 4.8  HGB 11.2* 11.7* 11.6* 10.7* 11.1*  HCT 36.5* 39.3 38.9* 35.6* 37.0*  PLT 182 181 175 164 182  MCV 93.8 96.6 96.3 96.0 95.4  MCH 28.8 28.7 28.7 28.8 28.6  MCHC 30.7 29.8* 29.8* 30.1 30.0   RDW 15.0 14.9 14.9 14.9 14.7  LYMPHSABS 0.6*  --  0.7 0.8 0.8  MONOABS 0.4  --  0.4 0.4 0.4  EOSABS 0.0  --  0.1 0.1 0.0  BASOSABS 0.0  --  0.0 0.0 0.0    Chemistries  Recent Labs  Lab 03/27/19 1045 03/27/19 1810 03/28/19 0105 03/29/19 0323 03/30/19 0306  NA 137  --  140 137 136  K 4.3  --  4.3 4.9 4.5  CL 102  --  100 99 96*  CO2 26  --  33* 30 32  GLUCOSE 160*  --  94 92 121*  BUN 19  --  CREATININE 1.05 1.00 0.97 0.85 0.83  CALCIUM 7.9*  --  8.1* 7.9* 8.4*  MG  --   --  2.0 2.1 2.1  AST 40  --  43* 44* 45*  ALT 20  --  ALKPHOS 54  --  60 56 61  BILITOT 0.8  --  0.5 0.8 0.6   ------------------------------------------------------------------------------------------------------------------ No results for input(s): CHOL, HDL, LDLCALC, TRIG, CHOLHDL, LDLDIRECT in the last 72 hours.  No results found for: HGBA1C ------------------------------------------------------------------------------------------------------------------ Recent Labs    03/30/19 0306  TSH 0.795    Cardiac Enzymes Recent Labs  Lab 03/27/19 1045  TROPONINI 0.04*   ------------------------------------------------------------------------------------------------------------------    Component Value Date/Time   BNP 168.0 (H) 03/27/2019 1045    Micro Results Recent Results (from the past 240 hour(s))  Blood Culture (routine x 2)     Status: Abnormal   Collection Time: 03/27/19 10:45 AM  Result Value Ref Range Status   Specimen Description   Final    BLOOD LEFT ANTECUBITAL Performed at [facility], [address] [place], [place] [address]    Special Requests   Final    BOTTLES DRAWN AEROBIC AND ANAEROBIC Blood Culture adequate volume Performed at [facility], [address] [place], [place] [address]    Culture  Setup Time   Final    GRAM POSITIVE COCCI AEROBIC BOTTLE ONLY CRITICAL RESULT CALLED TO, READ BACK BY AND VERIFIED WITH: JENNA  BULLINS AT 0981 03/28/2019 SDR    Culture (A)  Final    STAPHYLOCOCCUS SPECIES (COAGULASE NEGATIVE) THE SIGNIFICANCE OF ISOLATING THIS ORGANISM FROM A SINGLE SET OF BLOOD CULTURES WHEN MULTIPLE SETS ARE DRAWN IS UNCERTAIN. PLEASE NOTIFY THE MICROBIOLOGY DEPARTMENT WITHIN ONE WEEK IF SPECIATION AND SENSITIVITIES ARE REQUIRED. Performed at [facility], [address], [place], [place] [address]    Report Status 03/30/2019 FINAL  Final  Blood Culture (routine x 2)     Status:  None (Preliminary result)   Collection Time: 03/27/19 10:45 AM  Result Value Ref Range Status   Specimen Description BLOOD BLOOD RIGHT HAND  Final   Special Requests   Final    BOTTLES DRAWN AEROBIC AND ANAEROBIC Blood Culture adequate volume   Culture   Final    NO GROWTH 3 DAYS Performed at [facility], [address] [place], [place] [address]    Report Status PENDING  Incomplete  Blood Culture ID Panel (Reflexed)     Status: Abnormal   Collection Time: 03/27/19 10:45 AM  Result Value Ref Range Status   Enterococcus species NOT DETECTED NOT DETECTED Final   Listeria monocytogenes NOT DETECTED NOT DETECTED Final   Staphylococcus species DETECTED (A) NOT DETECTED Final    Comment: Methicillin (oxacillin) resistant coagulase negative staphylococcus. Possible blood culture contaminant (unless isolated from more than one blood culture draw or clinical case suggests pathogenicity). No antibiotic treatment is indicated for blood  culture contaminants. CRITICAL RESULT CALLED TO, READ BACK BY AND VERIFIED WITH:  JENNA BULLINS AT 6045 03/28/2019 SDR    Staphylococcus aureus (BCID) NOT DETECTED NOT DETECTED Final   Methicillin resistance DETECTED (A) NOT DETECTED Final    Comment: CRITICAL RESULT CALLED TO, READ BACK BY AND VERIFIED WITH:  JENNA BULLINS AT 4098 03/28/2019 SDR    Streptococcus species NOT DETECTED NOT DETECTED Final   Streptococcus agalactiae NOT DETECTED NOT DETECTED Final    Streptococcus pneumoniae NOT DETECTED NOT DETECTED Final   Streptococcus pyogenes NOT DETECTED NOT DETECTED Final   Acinetobacter baumannii NOT DETECTED NOT DETECTED Final   Enterobacteriaceae species NOT DETECTED NOT DETECTED Final   Enterobacter cloacae complex NOT DETECTED NOT DETECTED Final   Escherichia coli NOT DETECTED NOT DETECTED Final   Klebsiella oxytoca NOT DETECTED NOT DETECTED Final   Klebsiella pneumoniae NOT DETECTED NOT DETECTED Final   Proteus species NOT DETECTED NOT DETECTED Final   Serratia marcescens NOT DETECTED NOT DETECTED Final   Haemophilus influenzae NOT DETECTED NOT DETECTED Final   Neisseria meningitidis NOT DETECTED NOT DETECTED Final   Pseudomonas aeruginosa NOT DETECTED NOT DETECTED Final   Candida albicans NOT DETECTED NOT DETECTED Final   Candida glabrata NOT DETECTED NOT DETECTED Final   Candida krusei NOT DETECTED NOT DETECTED Final   Candida parapsilosis NOT DETECTED NOT DETECTED Final   Candida tropicalis NOT DETECTED NOT DETECTED Final    Comment: Performed at [facility], [address], [place], [place] [address]    Radiology Reports Dg Chest Port 1 View  Result Date: 03/30/2019 CLINICAL DATA:  Shortness of breath, positive COVID-19 test EXAM: PORTABLE CHEST 1 VIEW COMPARISON:  03/29/2019 FINDINGS: Cardiac shadow is enlarged but stable. Aortic calcifications are seen. The lungs are well aerated bilaterally. Patchy infiltrates are seen bilaterally but worst in the left upper lobe stable from the previous exam. No new focal abnormality is seen. IMPRESSION: Stable multifocal pneumonia. Electronically Signed   By: Alcide Clever M.D.   On: 03/30/2019 07:49   Dg Chest Port 1 View  Result Date: 03/29/2019 CLINICAL DATA:  Shortness of breath EXAM: PORTABLE CHEST 1 VIEW COMPARISON:  03/28/2019 FINDINGS: Mild patchy opacities in the lungs bilaterally, left upper lobe predominant, compatible multifocal pneumonia. Mild blunting of the left  costophrenic angle. No pneumothorax. Cardiomegaly.  Thoracic aortic atherosclerosis. IMPRESSION: Multifocal pneumonia, left upper lobe predominant. Overall appearance is grossly unchanged. Electronically Signed   By: Charline Bills M.D.   On: 03/29/2019 08:22   Dg Chest [facility]  1 View  Result Date: 03/28/2019 CLINICAL DATA:  Acute respiratory failure with hypoxia EXAM: PORTABLE CHEST 1 VIEW COMPARISON:  03/27/2019 FINDINGS: Mild patchy left upper and lower lobe opacities, suspicious for pneumonia. No frank interstitial edema on the current study. Suspected small left pleural effusion. No pneumothorax. The heart is top-normal in size. IMPRESSION: Mild patchy left upper and lower lobe opacities, suspicious for pneumonia. Suspected small left pleural effusion. No frank interstitial edema on the current study. Electronically Signed   By: Charline Bills M.D.   On: 03/28/2019 08:14   Dg Chest Port 1 View  Result Date: 03/27/2019 CLINICAL DATA:  Shortness of breath and poor oxygen saturation. Positive coronavirus test. EXAM: PORTABLE CHEST 1 VIEW COMPARISON:  12/09/2013 FINDINGS: Artifact overlies the chest. The heart is enlarged. There appears to be pulmonary venous hypertension. There is abnormal interstitial lung density. There is an effusion on the left. No dense consolidation. This radiographic appearance could be due to congestive heart failure. However, due to the history of positive virus test, there certainly could be coexistence viral pneumonia. IMPRESSION: Findings most suggestive of congestive heart failure with pulmonary edema and left effusion. Coexistent viral pneumonia not excluded. Electronically Signed   By: Paulina Fusi M.D.   On: 03/27/2019 11:45

## 2019-03-30 NOTE — Evaluation (Signed)
Physical Therapy Evaluation Patient Details Name: Dwayne Bridges NeedsJohn W Perrell MRN: [id number] DOB: March 03, 1951 Today's Date: 03/30/2019   History of Present Illness  68 year old male was brought to [facility] from nursing home with shortness of breath and hypoxia, decompensated CHF, recently found to [facility]    Clinical Impression  The patient  Requires extensive assistance  For bed mobility and less assistance for standing and taking steps to recliner. Patient actually does better than anticipated. Patient was WC mobility level at the SNF. Pt admitted with above diagnosis. Pt currently with functional limitations due to the deficits listed below (see PT Problem List).  Pt will benefit from skilled PT to increase their independence and safety with mobility to allow discharge to the venue listed below.   Patient's HR 79 SaO2 89-93% 1 L RR 22, BP 111/66    Follow Up Recommendations SNF    Equipment Recommendations  None recommended by PT    Recommendations for Other Services       Precautions / Restrictions Precautions Precautions: Fall Precaution Comments: weepy legs/sores. monitor sats and HR      Mobility  Bed Mobility Overal bed mobility: Needs Assistance Bed Mobility: Sidelying to Sit;Rolling Rolling: Max assist;+2 for physical assistance;+2 for safety/equipment Sidelying to sit: Max assist;+2 for physical assistance;+2 for safety/equipment;HOB elevated       General bed mobility comments: extra time, assist with legs and trunk to sitting upright and scooting to bed edge with pad  Transfers Overall transfer level: Needs assistance Equipment used: Rolling walker (2 wheeled) Transfers: Sit to/from [employer];Stand Pivot Transfers Sit to Stand: Mod assist;+2 physical assistance;+2 safety/equipment;From elevated surface Stand pivot transfers: +2 physical assistance;+2 safety/equipment;Mod assist       General transfer  comment: extra time and trials before standing, assist to power up. small shuffle steps to turn to recliner. Patient did reach back to arm rest right arm  Ambulation/Gait                Stairs            Wheelchair Mobility    Modified Rankin (Stroke Patients Only)       Balance Overall balance assessment: Needs assistance Sitting-balance support: Feet supported;Bilateral upper extremity supported Sitting balance-Leahy Scale: Fair Sitting balance - Comments: sits at midline   Standing balance support: During functional activity;Bilateral upper extremity supported Standing balance-Leahy Scale: Poor                               Pertinent Vitals/Pain Pain Assessment: Faces Faces Pain Scale: Hurts little more Pain Location: legs Pain Intervention(s): Monitored during session    Home Living Family/patient expects to be discharged to:: Skilled nursing facility                      Prior Function Level of Independence: Needs assistance   Gait / Transfers Assistance Needed: uses wheelchair after breaking his leg           Hand Dominance        Extremity/Trunk Assessment   Upper Extremity Assessment Upper Extremity Assessment: Generalized weakness;RUE deficits/detail;LUE deficits/detail RUE Deficits / Details: hands are stiff, decreased grip LUE Deficits / Details: similar to right    Lower Extremity Assessment Lower Extremity Assessment: RLE deficits/detail;Generalized weakness;LLE deficits/detail RLE Deficits / Details: noted discoloration stocking glove, open and draining sores LLE Deficits / Details: similar to  right    Cervical / Trunk Assessment Cervical / Trunk Assessment: Kyphotic  Communication   Communication: No difficulties  Cognition Arousal/Alertness: Awake/alert Behavior During Therapy: WFL for tasks assessed/performed Overall Cognitive Status: No family/caregiver present to determine baseline cognitive  functioning                                 General Comments: oriented to self, situation, year, day of week, not month      General Comments      Exercises     Assessment/Plan    PT Assessment Patient needs continued PT services  PT Problem List Decreased strength;Decreased range of motion;Decreased activity tolerance;Decreased knowledge of precautions;Decreased mobility;Decreased safety awareness;Decreased knowledge of use of DME       PT Treatment Interventions DME instruction;Functional mobility training;Therapeutic activities;Patient/family education;Wheelchair mobility training    PT Goals (Current goals can be found in the Care Plan section)  Acute Rehab PT Goals Patient Stated Goal: to get up in a WC. PT Goal Formulation: With patient Time For Goal Achievement: 04/13/19 Potential to Achieve Goals: Good    Frequency Min 2X/week   Barriers to discharge        Co-evaluation               AM-PAC PT "6 Clicks" Mobility  Outcome Measure Help needed turning from your back to your side while in a flat bed without using bedrails?: Total Help needed moving from lying on your back to sitting on the side of a flat bed without using bedrails?: Total Help needed moving to and from a bed to a chair (including a wheelchair)?: Total Help needed standing up from a chair using your arms (e.g., wheelchair or bedside chair)?: Total Help needed to walk in hospital room?: Total Help needed climbing 3-5 steps with a railing? : Total 6 Click Score: 6    End of Session Equipment Utilized During Treatment: Gait belt Activity Tolerance: Patient tolerated treatment well Patient left: in chair;with call bell/phone within reach;with nursing/sitter in room;with chair alarm set Nurse Communication: Mobility status PT Visit Diagnosis: Unsteadiness on feet (R26.81)    Time: 1530-1602 PT Time Calculation (min) (ACUTE ONLY): 32 min   Charges:   PT Evaluation $PT Eval  Moderate Complexity: 1 Mod PT Treatments $Therapeutic Activity: 8-22 mins        Blanchard Kelch PT Acute Rehabilitation Services Pager [phone] Office [phone]   Rada Hay 03/30/2019, 5:24 PM

## 2019-03-31 LAB — COMPREHENSIVE METABOLIC PANEL
ALT: 33 U/L (ref 0–44)
AST: 41 U/L (ref 15–41)
Albumin: 2.3 g/dL — ABNORMAL LOW (ref 3.5–5.0)
Alkaline Phosphatase: 57 U/L (ref 38–126)
Anion gap: 11 (ref 5–15)
BUN: 18 mg/dL (ref 8–23)
CO2: 32 mmol/L (ref 22–32)
Calcium: 8.5 mg/dL — ABNORMAL LOW (ref 8.9–10.3)
Chloride: 94 mmol/L — ABNORMAL LOW (ref 98–111)
Creatinine, Ser: 0.74 mg/dL (ref 0.61–1.24)
GFR calc Af Amer: >60 mL/min (ref >60)
GFR calc non Af Amer: >60 mL/min (ref >60)
Glucose, Bld: 90 mg/dL (ref 70–99)
Potassium: 3.9 mmol/L (ref 3.5–5.1)
Sodium: 137 mmol/L (ref 135–145)
Total Bilirubin: 0.6 mg/dL (ref 0.3–1.2)
Total Protein: 6.9 g/dL (ref 6.5–8.1)

## 2019-03-31 LAB — FERRITIN: Ferritin: 143 ng/mL (ref 24–336)

## 2019-03-31 LAB — C-REACTIVE PROTEIN: CRP: 9.9 mg/dL — ABNORMAL HIGH (ref <1.0)

## 2019-03-31 LAB — CBC WITH DIFFERENTIAL/PLATELET
Abs Immature Granulocytes: 0.12 10*3/uL — ABNORMAL HIGH (ref 0.00–0.07)
Basophils Absolute: 0.1 10*3/uL (ref 0.0–0.1)
Basophils Relative: 1 %
Eosinophils Absolute: 0.1 10*3/uL (ref 0.0–0.5)
Eosinophils Relative: 3 %
HCT: 35.9 % — ABNORMAL LOW (ref 39.0–52.0)
Hemoglobin: 11 g/dL — ABNORMAL LOW (ref 13.0–17.0)
Immature Granulocytes: 3 %
Lymphocytes Relative: 25 %
Lymphs Abs: 1 10*3/uL (ref 0.7–4.0)
MCH: 28.8 pg (ref 26.0–34.0)
MCHC: 30.6 g/dL (ref 30.0–36.0)
MCV: 94 fL (ref 80.0–100.0)
Monocytes Absolute: 0.4 10*3/uL (ref 0.1–1.0)
Monocytes Relative: 9 %
Neutro Abs: 2.4 10*3/uL (ref 1.7–7.7)
Neutrophils Relative %: 59 %
Platelets: 188 10*3/uL (ref 150–400)
RBC: 3.82 MIL/uL — ABNORMAL LOW (ref 4.22–5.81)
RDW: 14.6 % (ref 11.5–15.5)
WBC: 4.2 10*3/uL (ref 4.0–10.5)
nRBC: 0 % (ref 0.0–0.2)

## 2019-03-31 LAB — MAGNESIUM: Magnesium: 2 mg/dL (ref 1.7–2.4)

## 2019-03-31 LAB — D-DIMER, QUANTITATIVE: D-Dimer, Quant: 0.75 ug/mL-FEU — ABNORMAL HIGH (ref 0.00–0.50)

## 2019-03-31 LAB — PROCALCITONIN: Procalcitonin: <0.1 ng/mL

## 2019-03-31 MED ORDER — FUROSEMIDE 40 MG PO TABS: 40.0000 mg | ORAL_TABLET | Freq: Two times a day (BID) | ORAL | Status: AC

## 2019-03-31 MED ORDER — DOXYCYCLINE HYCLATE 100 MG PO TABS: 100.0000 mg | ORAL_TABLET | Freq: Two times a day (BID) | ORAL | Status: AC

## 2019-03-31 MED ORDER — LISINOPRIL 5 MG PO TABS: 5.0000 mg | ORAL_TABLET | Freq: Every day | ORAL | Status: DC

## 2019-03-31 MED ORDER — APIXABAN 5 MG PO TABS: 5.0000 mg | ORAL_TABLET | Freq: Two times a day (BID) | ORAL | Status: AC

## 2019-03-31 MED ORDER — POTASSIUM CHLORIDE CRYS ER 20 MEQ PO TBCR: 20.0000 meq | EXTENDED_RELEASE_TABLET | Freq: Every day | ORAL | Status: AC

## 2019-03-31 MED ORDER — DILTIAZEM HCL ER COATED BEADS 180 MG PO CP24: 180.0000 mg | ORAL_CAPSULE | Freq: Every day | ORAL | 11 refills | Status: DC

## 2019-03-31 NOTE — TOC Transition Note (Addendum)
Transition of Care [facility]) - CM/SW Discharge Note   Patient Details  Name: Dwayne Bridges MRN: [id number] Date of Birth: 02-25-1951  [facility]) CM/SW Contact:  Gildardo Griffes, LCSW Phone Number: 03/31/2019, 11:33 AM   Clinical Narrative:     Patient will DC to: [facility] Anticipated DC date: 03/31/2019 Family notified:Mary Transport OV:ANVB  Per MD patient ready for DC to [facility]. RN, patient, patient's family, and facility notified of DC. Discharge Summary sent to facility. RN given number for report [phone] Room 315 A. DC packet on chart. Ambulance transport requested for patient for 2:00 pm per nurse request. CSW signing off.    [place], [place] [phone]   Final next level of care: Skilled Nursing Facility Barriers to Discharge: No Barriers Identified   Patient Goals and CMS Choice   CMS Medicare.gov Compare Post Acute Care list provided to:: Patient Represenative (must comment)(Mary (niece)) Choice offered to / list presented to : (niece [facility])  Discharge Placement PASRR number recieved: 03/30/19            Patient [facility] bed at: [facility] Patient to be transferred to facility by: [facility] Name of family member notified: Mary Patient and family notified of of transfer: 03/31/19  Discharge Plan and Services     Post Acute Care Choice: Skilled Nursing Facility                               Social Determinants of Health (SDOH) Interventions     Readmission Risk Interventions No flowsheet data found.

## 2019-03-31 NOTE — Progress Notes (Signed)
Gave report to nurse Eunice Blase at [facility]. All questions answered

## 2019-03-31 NOTE — Discharge Summary (Signed)
Dwayne Bridges NeedsJohn W Bridges ZOX:[id number] DOB: 1951/08/16 DOA: 03/27/2019  PCP: Patient, [name] Per  Admit date: 03/27/2019  Discharge date: 03/31/2019  Admitted From: SNF   Disposition:  SNF   Recommendations for Outpatient Follow-up:   Follow up with PCP in 1-2 weeks  PCP Please obtain BMP/CBC, 2 view CXR in 1week,  (see Discharge instructions)   PCP Please follow up on the following pending results:    Home Health: NONE    Equipment/Devices: None  Consultations: None Discharge Condition: Stable   CODE STATUS: DNR   Diet Recommendation: Heart Healthy 1.5 L/day total fluid restriction.   CC weakness   Brief history of present illness from the day of admission and additional interim summary      68 year old male was brought to [facility] from nursing home with shortness of breath and hypoxia. He was recently found to have COVID-19 infection approximately 1 week prior. Work-up in the emergency room indicated volume overload with decompensated CHF. [facility].                                                                 Hospital Course      1. Acute Hypoxic Resp. Failure due to Acute Covid 19 Viral Illness during the ongoing 2020 Covid 19 Pandemic with some element of acute on chronic diastolic CHF-COVID-19 infection seems to have stabilized inflammatory markers are stable, continue monitoring with supportive care and titrate down oxygen.  Currently on room air when awake, night time drops are likely undiagnosed OSA.  He is being discharged on 2 L nasal cannula oxygen at nighttime scheduled with outpatient sleep study, day time oxygen as needed 1 to 2 L if needed.  COVID-19 Labs  Recent Labs    03/29/19 0323 03/30/19 0306 03/31/19 0600  DDIMER  0.90* 0.83* 0.75*  FERRITIN 164 182 143  CRP 13.6* 11.3* 9.9*    No results found for: SARSCOV2NAA     2.  Acute on chronic diastolic CHF EF 60% on last echocardiogram-much improved with diuresis with IV Lasix, on discharge oral dose adjusted, SNF MD continue to monitor electrolytes and renal function. CHF much improved.  Kindly arrange for outpatient cardiology follow-up and echocardiogram in 1 to 2 weeks.  3.  Paroxysmal atrial fibrillation with RVR.  [place] vas 2 score of at least 2.    Goal is rate control which is controlled on Cardizem, Eliquis for stroke prophylaxis, please follow strict fall precautions at SNF, if fall risk increases in the future reassess risk and benefit with anticoagulation.  Outpatient Echocardiogram and follow with cardiology in 1 to 2 weeks post discharge. Stable baseline TSH.  4.  BPH.  On alpha-blocker continue.  5.  Dyslipidemia.  On statin.  6.  Lower  extremity cellulitis with a small shallow right lower extremity ulcer.  Minimal to no infection, minimal warmth, transition to oral doxycycline from meropenem.   Doxycycline stop date 04/02/2019.  7.  Underlying dementia.  Some element of delirium from time to time.  Stable on Depakote.    Discharge diagnosis     Active Problems:   Acute on chronic diastolic CHF (congestive heart failure) (HCC)   Acute on chronic respiratory failure with hypoxia (HCC)   COVID-19 virus infection   HTN (hypertension)   BPH (benign prostatic hyperplasia)   HLD (hyperlipidemia)   CHF exacerbation (HCC)   Atrial fibrillation with RVR (HCC)    Discharge instructions    Discharge Instructions    Diet - low sodium heart healthy   Complete by:  As directed    Discharge instructions   Complete by:  As directed    Follow with Primary MD  in 7 days   Get CBC, CMP, 2 view Chest X ray -  checked  by Primary MD or SNF MD in 5-7 days   Activity: As tolerated with Full fall precautions use walker/cane &  assistance as needed  Disposition SNF  Diet: Heart Healthy  with feeding assistance and aspiration precautions.  1.5 L/day total fluid restriction.  Special Instructions: If you have smoked or chewed Tobacco  in the last 2 yrs please stop smoking, stop any regular Alcohol  and or any Recreational drug use.  On your next visit with your primary care physician please Get Medicines reviewed and adjusted.  Please request your Prim.MD to go over all Hospital Tests and Procedure/Radiological results at the follow up, please get all Hospital records sent to your Prim MD by signing hospital release before you go home.  If you experience worsening of your admission symptoms, develop shortness of breath, life threatening emergency, suicidal or homicidal thoughts you must seek medical attention immediately by calling 911 or calling your MD immediately  if symptoms less severe.  You Must read complete instructions/literature along with all the possible adverse reactions/side effects for all the Medicines you take and that have been prescribed to you. Take any new Medicines after you have completely understood and accpet all the possible adverse reactions/side effects.   Increase activity slowly   Complete by:  As directed       Discharge Medications   Allergies as of 03/31/2019   No Known Allergies     Medication List    STOP taking these medications   cephALEXin 500 MG capsule Commonly known as:  KEFLEX     TAKE these medications   acetaminophen 325 MG tablet Commonly known as:  TYLENOL Take 650 mg by mouth every 6 (six) hours as needed (leg pain).   apixaban 5 MG Tabs tablet Commonly known as:  ELIQUIS Take 1 tablet (5 mg total) by mouth 2 (two) times daily.   atorvastatin 20 MG tablet Commonly known as:  LIPITOR Take 20 mg by mouth daily.   diltiazem 180 MG 24 hr capsule Commonly known as:  Cardizem CD Take 1 capsule (180 mg total) by mouth daily.   divalproex 125 MG  capsule Commonly known as:  DEPAKOTE SPRINKLE Take 375 mg by mouth 2 (two) times daily.   doxycycline 100 MG tablet Commonly known as:  VIBRA-TABS Take 1 tablet (100 mg total) by mouth every 12 (twelve) hours for 2 days.   finasteride 5 MG tablet Commonly known as:  PROSCAR Take 5 mg by mouth daily.  furosemide 40 MG tablet Commonly known as:  LASIX Take 1 tablet (40 mg total) by mouth 2 (two) times daily. What changed:  when to take this   lisinopril 5 MG tablet Commonly known as:  ZESTRIL Take 1 tablet (5 mg total) by mouth daily. What changed:    medication strength  how much to take   multivitamin tablet Take 1 tablet by mouth daily.   potassium chloride SA 20 MEQ tablet Commonly known as:  K-DUR Take 1 tablet (20 mEq total) by mouth daily. What changed:  medication strength   sennosides-docusate sodium 8.6-50 MG tablet Commonly known as:  SENOKOT-S Take 1 tablet by mouth 2 (two) times daily.   terazosin 10 MG capsule Commonly known as:  HYTRIN Take 10 mg by mouth at bedtime.   traZODone 50 MG tablet Commonly known as:  DESYREL Take 50 mg by mouth at bedtime.         Major procedures and Radiology Reports - PLEASE review detailed and final reports thoroughly  -         Dg Chest Port 1 View  Result Date: 03/30/2019 CLINICAL DATA:  Shortness of breath, positive COVID-19 test EXAM: PORTABLE CHEST 1 VIEW COMPARISON:  03/29/2019 FINDINGS: Cardiac shadow is enlarged but stable. Aortic calcifications are seen. The lungs are well aerated bilaterally. Patchy infiltrates are seen bilaterally but worst in the left upper lobe stable from the previous exam. No new focal abnormality is seen. IMPRESSION: Stable multifocal pneumonia. Electronically Signed   By: Alcide Clever M.D.   On: 03/30/2019 07:49   Dg Chest Port 1 View  Result Date: 03/29/2019 CLINICAL DATA:  Shortness of breath EXAM: PORTABLE CHEST 1 VIEW COMPARISON:  03/28/2019 FINDINGS: Mild patchy opacities  in the lungs bilaterally, left upper lobe predominant, compatible multifocal pneumonia. Mild blunting of the left costophrenic angle. No pneumothorax. Cardiomegaly.  Thoracic aortic atherosclerosis. IMPRESSION: Multifocal pneumonia, left upper lobe predominant. Overall appearance is grossly unchanged. Electronically Signed   By: Charline Bills M.D.   On: 03/29/2019 08:22   Dg Chest Port 1 View  Result Date: 03/28/2019 CLINICAL DATA:  Acute respiratory failure with hypoxia EXAM: PORTABLE CHEST 1 VIEW COMPARISON:  03/27/2019 FINDINGS: Mild patchy left upper and lower lobe opacities, suspicious for pneumonia. No frank interstitial edema on the current study. Suspected small left pleural effusion. No pneumothorax. The heart is top-normal in size. IMPRESSION: Mild patchy left upper and lower lobe opacities, suspicious for pneumonia. Suspected small left pleural effusion. No frank interstitial edema on the current study. Electronically Signed   By: Charline Bills M.D.   On: 03/28/2019 08:14   Dg Chest Port 1 View  Result Date: 03/27/2019 CLINICAL DATA:  Shortness of breath and poor oxygen saturation. Positive coronavirus test. EXAM: PORTABLE CHEST 1 VIEW COMPARISON:  12/09/2013 FINDINGS: Artifact overlies the chest. The heart is enlarged. There appears to be pulmonary venous hypertension. There is abnormal interstitial lung density. There is an effusion on the left. No dense consolidation. This radiographic appearance could be due to congestive heart failure. However, due to the history of positive virus test, there certainly could be coexistence viral pneumonia. IMPRESSION: Findings most suggestive of congestive heart failure with pulmonary edema and left effusion. Coexistent viral pneumonia not excluded. Electronically Signed   By: Paulina Fusi M.D.   On: 03/27/2019 11:45    Micro Results     Recent Results (from the past 240 hour(s))  Blood Culture (routine x 2)     Status: Abnormal  Collection  Time: 03/27/19 10:45 AM  Result Value Ref Range Status   Specimen Description   Final    BLOOD LEFT ANTECUBITAL Performed at [facility], [address] [place], [place] [address]    Special Requests   Final    BOTTLES DRAWN AEROBIC AND ANAEROBIC Blood Culture adequate volume Performed at [facility], [address] [place], [place] [address]    Culture  Setup Time   Final    GRAM POSITIVE COCCI AEROBIC BOTTLE ONLY CRITICAL RESULT CALLED TO, READ BACK BY AND VERIFIED WITH: JENNA BULLINS AT 7846 03/28/2019 SDR    Culture (A)  Final    STAPHYLOCOCCUS SPECIES (COAGULASE NEGATIVE) THE SIGNIFICANCE OF ISOLATING THIS ORGANISM FROM A SINGLE SET OF BLOOD CULTURES WHEN MULTIPLE SETS ARE DRAWN IS UNCERTAIN. PLEASE NOTIFY THE MICROBIOLOGY DEPARTMENT WITHIN ONE WEEK IF SPECIATION AND SENSITIVITIES ARE REQUIRED. Performed at [facility], [address], [place], [place] [address]    Report Status 03/30/2019 FINAL  Final  Blood Culture (routine x 2)     Status: None (Preliminary result)   Collection Time: 03/27/19 10:45 AM  Result Value Ref Range Status   Specimen Description BLOOD BLOOD RIGHT HAND  Final   Special Requests   Final    BOTTLES DRAWN AEROBIC AND ANAEROBIC Blood Culture adequate volume   Culture   Final    NO GROWTH 4 DAYS Performed at [facility], [address] [place], [place] [address]    Report Status PENDING  Incomplete  Blood Culture ID Panel (Reflexed)     Status: Abnormal   Collection Time: 03/27/19 10:45 AM  Result Value Ref Range Status   Enterococcus species NOT DETECTED NOT DETECTED Final   Listeria monocytogenes NOT DETECTED NOT DETECTED Final   Staphylococcus species DETECTED (A) NOT DETECTED Final    Comment: Methicillin (oxacillin) resistant coagulase negative staphylococcus. Possible blood culture contaminant (unless isolated from more than one blood culture draw or clinical case suggests pathogenicity). No antibiotic  treatment is indicated for blood  culture contaminants. CRITICAL RESULT CALLED TO, READ BACK BY AND VERIFIED WITH:  JENNA BULLINS AT 2440 03/28/2019 SDR    Staphylococcus aureus (BCID) NOT DETECTED NOT DETECTED Final   Methicillin resistance DETECTED (A) NOT DETECTED Final    Comment: CRITICAL RESULT CALLED TO, READ BACK BY AND VERIFIED WITH:  JENNA BULLINS AT 1027 03/28/2019 SDR    Streptococcus species NOT DETECTED NOT DETECTED Final   Streptococcus agalactiae NOT DETECTED NOT DETECTED Final   Streptococcus pneumoniae NOT DETECTED NOT DETECTED Final   Streptococcus pyogenes NOT DETECTED NOT DETECTED Final   Acinetobacter baumannii NOT DETECTED NOT DETECTED Final   Enterobacteriaceae species NOT DETECTED NOT DETECTED Final   Enterobacter cloacae complex NOT DETECTED NOT DETECTED Final   Escherichia coli NOT DETECTED NOT DETECTED Final   Klebsiella oxytoca NOT DETECTED NOT DETECTED Final   Klebsiella pneumoniae NOT DETECTED NOT DETECTED Final   Proteus species NOT DETECTED NOT DETECTED Final   Serratia marcescens NOT DETECTED NOT DETECTED Final   Haemophilus influenzae NOT DETECTED NOT DETECTED Final   Neisseria meningitidis NOT DETECTED NOT DETECTED Final   Pseudomonas aeruginosa NOT DETECTED NOT DETECTED Final   Candida albicans NOT DETECTED NOT DETECTED Final   Candida glabrata NOT DETECTED NOT DETECTED Final   Candida krusei NOT DETECTED NOT DETECTED Final   Candida parapsilosis NOT DETECTED NOT DETECTED Final   Candida tropicalis NOT DETECTED NOT DETECTED Final    Comment: Performed at  [facility], [address], [place], [place] [address]    Today   Subjective    Vash Quezada today has no headache,no chest abdominal pain,no new weakness tingling or numbness, feels much better     Objective   Blood pressure 106/68, pulse (!) 57, temperature 97.6 F (36.4 C), temperature source Oral, resp. rate (!) 23, height  (1.676 m), weight 112.2 kg, SpO2 92  %.   Intake/Output Summary (Last 24 hours) at 03/31/2019 0925 Last data filed at 03/31/2019 0700 Gross per 24 hour  Intake 890 ml  Output 1625 ml  Net -735 ml    Exam  Awake Alert,  No new F.N deficits, Normal affect [place].AT,PERRAL Supple Neck,No JVD, No cervical lymphadenopathy appriciated.  Symmetrical Chest wall movement, Good air movement bilaterally, CTAB RRR,No Gallops,Rubs or new Murmurs, No Parasternal Heave +ve B.Sounds, Abd Soft, Non tender, No organomegaly appriciated, No rebound -guarding or rigidity. No Cyanosis, chronic skin breakdown in both lower extremities with a small shallow stage II medial aspect right lower extremity shin ulcer with no signs of infection   Data Review   CBC w Diff:  Lab Results  Component Value Date   WBC 4.2 03/31/2019   HGB 11.0 (L) 03/31/2019   HGB 11.6 (L) 12/02/2013   HCT 35.9 (L) 03/31/2019   HCT 35.2 (L) 12/02/2013   PLT 188 03/31/2019   PLT 173 12/02/2013   LYMPHOPCT 25 03/31/2019   LYMPHOPCT 13.3 12/02/2013   MONOPCT 9 03/31/2019   MONOPCT 7.0 12/02/2013   EOSPCT 3 03/31/2019   EOSPCT 0.1 12/02/2013   BASOPCT 1 03/31/2019   BASOPCT 0.4 12/02/2013    CMP:  Lab Results  Component Value Date   NA 137 03/31/2019   NA 142 12/05/2013   K 3.9 03/31/2019   K 3.1 (L) 12/05/2013   CL 94 (L) 03/31/2019   CL 102 12/05/2013   CO2 32 03/31/2019   CO2 35 (H) 12/05/2013   BUN 18 03/31/2019   BUN 11 12/05/2013   CREATININE 0.74 03/31/2019   CREATININE 0.77 12/05/2013   PROT 6.9 03/31/2019   PROT 7.8 12/01/2013   ALBUMIN 2.3 (L) 03/31/2019   ALBUMIN 2.7 (L) 12/01/2013   BILITOT 0.6 03/31/2019   BILITOT 0.7 12/01/2013   ALKPHOS 57 03/31/2019   ALKPHOS 83 12/01/2013   AST 41 03/31/2019   AST 44 (H) 12/01/2013   ALT 33 03/31/2019   ALT 25 12/01/2013  .   Total Time in preparing paper work, data evaluation and todays exam - 35 minutes  Susa Raring M.D on 03/31/2019 at 9:25 AM  [facility]   Office   [phone]

## 2019-04-01 LAB — CULTURE, BLOOD (ROUTINE X 2)
Culture: NO GROWTH
Special Requests: ADEQUATE

## 2019-06-30 ENCOUNTER — Inpatient Hospital Stay: Payer: Medicaid Other

## 2019-06-30 ENCOUNTER — Emergency Department: Payer: Medicaid Other

## 2019-06-30 ENCOUNTER — Other Ambulatory Visit: Payer: Self-pay

## 2019-06-30 ENCOUNTER — Inpatient Hospital Stay (HOSPITAL_COMMUNITY): Payer: Medicaid Other

## 2019-06-30 ENCOUNTER — Inpatient Hospital Stay
Admission: EM | Admit: 2019-06-30 | Discharge: 2019-07-03 | DRG: 291 | Disposition: A | Discharge status: Skilled Nursing Facility | Payer: Medicaid Other | Source: Skilled Nursing Facility | Attending: Internal Medicine | Admitting: Internal Medicine

## 2019-06-30 ENCOUNTER — Inpatient Hospital Stay
Admit: 2019-06-30 | Discharge: 2019-06-30 | Disposition: A | Discharge status: Home / Self Care | Payer: Medicaid Other | Attending: Nurse Practitioner | Admitting: Nurse Practitioner

## 2019-06-30 DIAGNOSIS — N400 Enlarged prostate without lower urinary tract symptoms: Secondary | ICD-10-CM | POA: Diagnosis present

## 2019-06-30 DIAGNOSIS — E874 Mixed disorder of acid-base balance: Secondary | ICD-10-CM | POA: Diagnosis present

## 2019-06-30 DIAGNOSIS — Z8619 Personal history of other infectious and parasitic diseases: Secondary | ICD-10-CM | POA: Diagnosis not present

## 2019-06-30 DIAGNOSIS — I5033 Acute on chronic diastolic (congestive) heart failure: Secondary | ICD-10-CM | POA: Diagnosis present

## 2019-06-30 DIAGNOSIS — G9341 Metabolic encephalopathy: Secondary | ICD-10-CM | POA: Diagnosis present

## 2019-06-30 DIAGNOSIS — E785 Hyperlipidemia, unspecified: Secondary | ICD-10-CM | POA: Diagnosis present

## 2019-06-30 DIAGNOSIS — R0602 Shortness of breath: Secondary | ICD-10-CM | POA: Diagnosis present

## 2019-06-30 DIAGNOSIS — J9601 Acute respiratory failure with hypoxia: Secondary | ICD-10-CM | POA: Diagnosis present

## 2019-06-30 DIAGNOSIS — Z7901 Long term (current) use of anticoagulants: Secondary | ICD-10-CM | POA: Diagnosis not present

## 2019-06-30 DIAGNOSIS — Z20828 Contact with and (suspected) exposure to other viral communicable diseases: Secondary | ICD-10-CM | POA: Diagnosis present

## 2019-06-30 DIAGNOSIS — I4821 Permanent atrial fibrillation: Secondary | ICD-10-CM | POA: Diagnosis present

## 2019-06-30 DIAGNOSIS — J9622 Acute and chronic respiratory failure with hypercapnia: Secondary | ICD-10-CM | POA: Diagnosis present

## 2019-06-30 DIAGNOSIS — Z87891 Personal history of nicotine dependence: Secondary | ICD-10-CM | POA: Diagnosis not present

## 2019-06-30 DIAGNOSIS — J9621 Acute and chronic respiratory failure with hypoxia: Secondary | ICD-10-CM | POA: Diagnosis present

## 2019-06-30 DIAGNOSIS — Z79899 Other long term (current) drug therapy: Secondary | ICD-10-CM | POA: Diagnosis not present

## 2019-06-30 DIAGNOSIS — Z6832 Body mass index (BMI) 32.0-32.9, adult: Secondary | ICD-10-CM | POA: Diagnosis not present

## 2019-06-30 DIAGNOSIS — I110 Hypertensive heart disease with heart failure: Secondary | ICD-10-CM | POA: Diagnosis present

## 2019-06-30 DIAGNOSIS — I454 Nonspecific intraventricular block: Secondary | ICD-10-CM | POA: Diagnosis present

## 2019-06-30 DIAGNOSIS — J9690 Respiratory failure, unspecified, unspecified whether with hypoxia or hypercapnia: Secondary | ICD-10-CM

## 2019-06-30 DIAGNOSIS — G4733 Obstructive sleep apnea (adult) (pediatric): Secondary | ICD-10-CM | POA: Diagnosis present

## 2019-06-30 DIAGNOSIS — Z66 Do not resuscitate: Secondary | ICD-10-CM | POA: Diagnosis present

## 2019-06-30 LAB — BLOOD GAS, ARTERIAL
Acid-Base Excess: 15 mmol/L — ABNORMAL HIGH (ref 0.0–2.0)
Bicarbonate: 44.7 mmol/L — ABNORMAL HIGH (ref 20.0–28.0)
Delivery systems: POSITIVE
FIO2: 0.35
O2 Saturation: 93.4 %
Patient temperature: 37
pCO2 arterial: 81 mmHg (ref 32.0–48.0)
pH, Arterial: 7.35 (ref 7.350–7.450)
pO2, Arterial: 72 mmHg — ABNORMAL LOW (ref 83.0–108.0)

## 2019-06-30 LAB — CBC WITH DIFFERENTIAL/PLATELET
Abs Immature Granulocytes: 0.2 10*3/uL — ABNORMAL HIGH (ref 0.00–0.07)
Basophils Absolute: 0.1 10*3/uL (ref 0.0–0.1)
Basophils Relative: 1 %
Eosinophils Absolute: 0 10*3/uL (ref 0.0–0.5)
Eosinophils Relative: 0 %
HCT: 52.8 % — ABNORMAL HIGH (ref 39.0–52.0)
Hemoglobin: 16.1 g/dL (ref 13.0–17.0)
Immature Granulocytes: 2 %
Lymphocytes Relative: 10 %
Lymphs Abs: 1 10*3/uL (ref 0.7–4.0)
MCH: 29.8 pg (ref 26.0–34.0)
MCHC: 30.5 g/dL (ref 30.0–36.0)
MCV: 97.6 fL (ref 80.0–100.0)
Monocytes Absolute: 0.6 10*3/uL (ref 0.1–1.0)
Monocytes Relative: 6 %
Neutro Abs: 7.9 10*3/uL — ABNORMAL HIGH (ref 1.7–7.7)
Neutrophils Relative %: 81 %
Platelets: 233 10*3/uL (ref 150–400)
RBC: 5.41 MIL/uL (ref 4.22–5.81)
RDW: 17 % — ABNORMAL HIGH (ref 11.5–15.5)
WBC: 9.7 10*3/uL (ref 4.0–10.5)
nRBC: 0.2 % (ref 0.0–0.2)

## 2019-06-30 LAB — BLOOD GAS, VENOUS
Acid-Base Excess: 15.3 mmol/L — ABNORMAL HIGH (ref 0.0–2.0)
Bicarbonate: 48 mmol/L — ABNORMAL HIGH (ref 20.0–28.0)
Delivery systems: POSITIVE
FIO2: 0.8
O2 Saturation: 83.1 %
Patient temperature: 37
pCO2, Ven: 112 mmHg (ref 44.0–60.0)
pH, Ven: 7.24 — ABNORMAL LOW (ref 7.250–7.430)
pO2, Ven: 56 mmHg — ABNORMAL HIGH (ref 32.0–45.0)

## 2019-06-30 LAB — LACTIC ACID, PLASMA
Lactic Acid, Venous: 3 mmol/L (ref 0.5–1.9)
Lactic Acid, Venous: 3.6 mmol/L (ref 0.5–1.9)

## 2019-06-30 LAB — URINALYSIS, COMPLETE (UACMP) WITH MICROSCOPIC
Bilirubin Urine: NEGATIVE
Glucose, UA: NEGATIVE mg/dL
Hgb urine dipstick: NEGATIVE
Ketones, ur: NEGATIVE mg/dL
Leukocytes,Ua: NEGATIVE
Nitrite: NEGATIVE
Protein, ur: 100 mg/dL — AB
Specific Gravity, Urine: 1.019 (ref 1.005–1.030)
pH: 5 (ref 5.0–8.0)

## 2019-06-30 LAB — COMPREHENSIVE METABOLIC PANEL
ALT: 17 U/L (ref 0–44)
AST: 24 U/L (ref 15–41)
Albumin: 3.5 g/dL (ref 3.5–5.0)
Alkaline Phosphatase: 104 U/L (ref 38–126)
Anion gap: 13 (ref 5–15)
BUN: 22 mg/dL (ref 8–23)
CO2: 36 mmol/L — ABNORMAL HIGH (ref 22–32)
Calcium: 8.9 mg/dL (ref 8.9–10.3)
Chloride: 90 mmol/L — ABNORMAL LOW (ref 98–111)
Creatinine, Ser: 1.07 mg/dL (ref 0.61–1.24)
GFR calc Af Amer: >60 mL/min (ref >60)
GFR calc non Af Amer: >60 mL/min (ref >60)
Glucose, Bld: 182 mg/dL — ABNORMAL HIGH (ref 70–99)
Potassium: 4.4 mmol/L (ref 3.5–5.1)
Sodium: 139 mmol/L (ref 135–145)
Total Bilirubin: 0.7 mg/dL (ref 0.3–1.2)
Total Protein: 8 g/dL (ref 6.5–8.1)

## 2019-06-30 LAB — SARS CORONAVIRUS 2 BY RT PCR (HOSPITAL ORDER, PERFORMED IN ~~LOC~~ HOSPITAL LAB): SARS Coronavirus 2: NEGATIVE

## 2019-06-30 LAB — TROPONIN I (HIGH SENSITIVITY)
Troponin I (High Sensitivity): 29 ng/L — ABNORMAL HIGH (ref <18)
Troponin I (High Sensitivity): 52 ng/L — ABNORMAL HIGH (ref <18)
Troponin I (High Sensitivity): 143 ng/L (ref <18)
Troponin I (High Sensitivity): 143 ng/L (ref <18)

## 2019-06-30 LAB — MRSA PCR SCREENING: MRSA by PCR: NEGATIVE

## 2019-06-30 LAB — VALPROIC ACID LEVEL: Valproic Acid Lvl: 36 ug/mL — ABNORMAL LOW (ref 50.0–100.0)

## 2019-06-30 LAB — BRAIN NATRIURETIC PEPTIDE: B Natriuretic Peptide: 168 pg/mL — ABNORMAL HIGH (ref 0.0–100.0)

## 2019-06-30 LAB — PROTIME-INR
INR: 1.3 — ABNORMAL HIGH (ref 0.8–1.2)
Prothrombin Time: 16.1 seconds — ABNORMAL HIGH (ref 11.4–15.2)

## 2019-06-30 LAB — AMMONIA: Ammonia: 27 umol/L (ref 9–35)

## 2019-06-30 LAB — GLUCOSE, CAPILLARY
Glucose-Capillary: 178 mg/dL — ABNORMAL HIGH (ref 70–99)
Glucose-Capillary: 127 mg/dL — ABNORMAL HIGH (ref 70–99)

## 2019-06-30 LAB — TSH: TSH: 1.193 u[IU]/mL (ref 0.350–4.500)

## 2019-06-30 MED ORDER — ACETAMINOPHEN 325 MG PO TABS
650.0000 mg | ORAL_TABLET | Freq: Four times a day (QID) | ORAL | Status: DC | PRN
  Administered 2019-07-01 – 2019-07-02 (×2): 650 mg via ORAL
  Filled 2019-06-30 (×2): qty 2

## 2019-06-30 MED ORDER — SENNOSIDES-DOCUSATE SODIUM 8.6-50 MG PO TABS
1.0000 | ORAL_TABLET | Freq: Two times a day (BID) | ORAL | Status: DC
  Administered 2019-07-01 – 2019-07-03 (×3): 1 via ORAL
  Filled 2019-06-30 (×5): qty 1

## 2019-06-30 MED ORDER — IPRATROPIUM-ALBUTEROL 0.5-2.5 (3) MG/3ML IN SOLN
3.0000 mL | Freq: Four times a day (QID) | RESPIRATORY_TRACT | Status: DC
  Administered 2019-06-30 – 2019-07-01 (×4): 3 mL via RESPIRATORY_TRACT
  Filled 2019-06-30 (×3): qty 3

## 2019-06-30 MED ORDER — ATORVASTATIN CALCIUM 20 MG PO TABS: 20.0000 mg | ORAL_TABLET | Freq: Every day | ORAL | Status: DC

## 2019-06-30 MED ORDER — APIXABAN 5 MG PO TABS
5.0000 mg | ORAL_TABLET | Freq: Two times a day (BID) | ORAL | Status: DC
  Administered 2019-07-01 – 2019-07-03 (×5): 5 mg via ORAL
  Filled 2019-06-30 (×5): qty 1

## 2019-06-30 MED ORDER — PERFLUTREN LIPID MICROSPHERE
1.0000 mL | INTRAVENOUS | Status: AC | PRN
  Administered 2019-06-30: 21:00:00 3 mL via INTRAVENOUS
  Filled 2019-06-30: qty 10

## 2019-06-30 MED ORDER — ADULT MULTIVITAMIN W/MINERALS CH
1.0000 | ORAL_TABLET | Freq: Every day | ORAL | Status: DC
  Administered 2019-07-01 – 2019-07-03 (×3): 1 via ORAL
  Filled 2019-06-30 (×3): qty 1

## 2019-06-30 MED ORDER — FUROSEMIDE 10 MG/ML IJ SOLN: 40.0000 mg | Freq: Two times a day (BID) | INTRAMUSCULAR | Status: DC

## 2019-06-30 MED ORDER — LISINOPRIL 5 MG PO TABS
5.0000 mg | ORAL_TABLET | Freq: Every day | ORAL | Status: DC
  Administered 2019-07-01 – 2019-07-03 (×3): 5 mg via ORAL
  Filled 2019-06-30 (×3): qty 1

## 2019-06-30 MED ORDER — TERAZOSIN HCL 5 MG PO CAPS
10.0000 mg | ORAL_CAPSULE | Freq: Every day | ORAL | Status: DC
  Administered 2019-07-03: 10 mg via ORAL
  Filled 2019-06-30 (×4): qty 2

## 2019-06-30 MED ORDER — DILTIAZEM HCL ER COATED BEADS 180 MG PO CP24
180.0000 mg | ORAL_CAPSULE | Freq: Every day | ORAL | Status: DC
  Administered 2019-07-01 – 2019-07-02 (×2): 180 mg via ORAL
  Filled 2019-06-30 (×4): qty 1

## 2019-06-30 MED ORDER — NALOXONE HCL 2 MG/2ML IJ SOSY
2.0000 mg | PREFILLED_SYRINGE | Freq: Once | INTRAMUSCULAR | Status: AC
  Administered 2019-06-30: 09:00:00 2 mg via INTRAVENOUS

## 2019-06-30 MED ORDER — FINASTERIDE 5 MG PO TABS
5.0000 mg | ORAL_TABLET | Freq: Every day | ORAL | Status: DC
  Administered 2019-07-01 – 2019-07-03 (×3): 5 mg via ORAL
  Filled 2019-06-30 (×3): qty 1

## 2019-06-30 MED ORDER — POTASSIUM CHLORIDE CRYS ER 20 MEQ PO TBCR: 20.0000 meq | EXTENDED_RELEASE_TABLET | Freq: Every day | ORAL | Status: DC

## 2019-06-30 MED ORDER — DIVALPROEX SODIUM 125 MG PO CSDR
375.0000 mg | DELAYED_RELEASE_CAPSULE | Freq: Two times a day (BID) | ORAL | Status: DC
  Filled 2019-06-30 (×2): qty 3

## 2019-06-30 MED ORDER — TRAZODONE HCL 50 MG PO TABS
50.0000 mg | ORAL_TABLET | Freq: Every day | ORAL | Status: DC
  Administered 2019-07-01 – 2019-07-02 (×2): 50 mg via ORAL
  Filled 2019-06-30 (×2): qty 1

## 2019-06-30 NOTE — Progress Notes (Signed)
Pt was transported to CT and to CCU while on bipap.

## 2019-06-30 NOTE — ED Notes (Signed)
Dr. Burlene Arnt informed of critical lactic

## 2019-06-30 NOTE — ED Notes (Signed)
Dr. Burlene Arnt informed of critical trop

## 2019-06-30 NOTE — Progress Notes (Signed)
Pt's HR dropped to 29, EKG done and showed a.fib with borderline st elevation. MD notified.

## 2019-06-30 NOTE — ED Notes (Signed)
Attempted to call report to ICU, per secretary the charge RN will have to call me back

## 2019-06-30 NOTE — ED Provider Notes (Addendum)
[facility] Emergency Department Provider Note  ____________________________________________   I have reviewed the triage vital signs and the nursing notes. Where available I have reviewed prior notes and, if possible and indicated, outside hospital notes.   Patient seen and evaluated during the coronavirus epidemic during a time with low staffing  Patient seen for the symptoms described in the history of present illness. She was evaluated in the context of the global COVID-19 pandemic, which necessitated consideration that the patient might be at risk for infection with the SARS-CoV-2 virus that causes COVID-19. Institutional protocols and algorithms that pertain to the evaluation of patients at risk for COVID-19 are in a state of rapid change based on information released by regulatory bodies including the CDC and federal and state organizations. These policies and algorithms were followed during the patient's care in the ED.    HISTORY  Chief Complaint unresponsive    HPI Dwayne Bridges is a 68 y.o. male with a history of coronavirus in the past, CHF atrial fibrillation,  chronically on oxygen presents today minimally responsive.  Patient is DNR. Did have coronavirus in May.  I have talked to his family they do verify they do not want him intubated.  They do agree with BiPAP.  No history available from patient.  Level 5 chart caveat; no further history available due to patient status.  Limited history from EMS, found unresponsive this morning.  Hypoxic.  Gave him oxygen.  Patient was found to be bradypneaic as well.  EMS had him with normal vital signs, but minimally responsive they placed him on high flow oxygen brought him in with a normal blood glucose.  Further history not available.  Per EMS he was found in bed with no trauma..   Past Medical History:  Diagnosis Date  . CHF (congestive heart failure) ([place])   . Hypertension     Patient Active Problem List    Diagnosis Date Noted  . Atrial fibrillation with RVR ([place]) 03/28/2019  . Acute on chronic diastolic CHF (congestive heart failure) ([place]) 03/27/2019  . Acute on chronic respiratory failure with hypoxia ([place]) 03/27/2019  . COVID-19 virus infection 03/27/2019  . HTN (hypertension) 03/27/2019  . BPH (benign prostatic hyperplasia) 03/27/2019  . HLD (hyperlipidemia) 03/27/2019  . CHF exacerbation ([place]) 03/27/2019    No past surgical history on file.  Prior to Admission medications   Medication Sig Start Date End Date Taking? Authorizing Provider  acetaminophen (TYLENOL) 325 MG tablet Take 650 mg by mouth every 6 (six) hours as needed (leg pain).    [provider]  apixaban (ELIQUIS) 5 MG TABS tablet Take 1 tablet (5 mg total) by mouth 2 (two) times daily. 03/31/19   Thurnell Lose, MD  atorvastatin (LIPITOR) 20 MG tablet Take 20 mg by mouth daily.    [provider]  diltiazem (CARDIZEM CD) 180 MG 24 hr capsule Take 1 capsule (180 mg total) by mouth daily. 03/31/19 03/30/20  Thurnell Lose, MD  divalproex (DEPAKOTE SPRINKLE) 125 MG capsule Take 375 mg by mouth 2 (two) times daily.    [provider]  finasteride (PROSCAR) 5 MG tablet Take 5 mg by mouth daily.    [provider]  furosemide (LASIX) 40 MG tablet Take 1 tablet (40 mg total) by mouth 2 (two) times daily. 03/31/19   Thurnell Lose, MD  lisinopril (ZESTRIL) 5 MG tablet Take 1 tablet (5 mg total) by mouth daily. 03/31/19   Thurnell Lose,  MD  Multiple Vitamin (MULTIVITAMIN) tablet Take 1 tablet by mouth daily.    [provider]  potassium chloride (K-DUR) 20 MEQ tablet Take 1 tablet (20 mEq total) by mouth daily. 03/31/19   Leroy SeaSingh, Prashant K, MD  sennosides-docusate sodium (SENOKOT-S) 8.6-50 MG tablet Take 1 tablet by mouth 2 (two) times daily.    [provider]  terazosin (HYTRIN) 10 MG capsule Take 10 mg by mouth at bedtime.    [provider]  traZODone  (DESYREL) 50 MG tablet Take 50 mg by mouth at bedtime.    [provider]    Allergies Patient has no known allergies.  No family history on file.  Social History Social History   Tobacco Use  . Smoking status: Former Smoker    Packs/day: 0.50    Years: 10.00    Pack years: 5.00    Types: Cigarettes    Quit date: 11/12/2008    Years since quitting: 10.6  . Smokeless tobacco: Never Used  Substance Use Topics  . Alcohol use: Not on file  . Drug use: Not on file    Review of Systems Level 5 chart caveat; no further history available due to patient status.   ____________________________________________   PHYSICAL EXAM:  VITAL SIGNS: ED Triage Vitals  Enc Vitals Group     BP 06/30/19 0913 (!) 130/94     Pulse Rate 06/30/19 0913 (!) 123     Resp 06/30/19 0913 18     Temp 06/30/19 0913 97.7 F (36.5 C)     Temp Source 06/30/19 0913 Axillary     SpO2 06/30/19 0908 98 %     Weight 06/30/19 0910 239 lb 8 oz (108.6 kg)     Height 06/30/19 0910 5\' 10"  (1.778 m)     Head Circumference --      Peak Flow --      Pain Score --      Pain Loc --      Pain Edu? --      Excl. in GC? --     Constitutional: Patient nonresponsive.  Groans a little bit to deep sternal rub Eyes: Conjunctivae are normal pupils are small minimally reactive Head: Atraumatic HEENT: No congestion/rhinnorhea. Mucous membranes are moist.  Oropharynx non-erythematous Neck:   Nontender with no meningismus, no masses, no stridor Cardiovascular: Normal rate, regularly irregular rhythm. Grossly normal heart sounds.  Good peripheral circulation. Respiratory: Very poor respiratory effort, occasional shallow breaths noted.  No obvious rales or rhonchi but limited exam Abdominal: Soft and nontender. No distention. No guarding no rebound Musculoskeletal: Significant edema Neurologic: Unobtainable Skin:  Skin is warm, dry and intact. No rash noted.   ____________________________________________    LABS (all labs ordered are listed, but only abnormal results are displayed)  Labs Reviewed  BLOOD GAS, VENOUS - Abnormal; Notable for the following components:      Result Value   pH, Ven 7.24 (*)    pCO2, Ven 112 (*)    pO2, Ven 56.0 (*)    Bicarbonate 48.0 (*)    Acid-Base Excess 15.3 (*)    All other components within normal limits  URINE CULTURE  CULTURE, BLOOD (ROUTINE X 2)  CULTURE, BLOOD (ROUTINE X 2)  SARS CORONAVIRUS 2 (HOSPITAL ORDER, PERFORMED IN [place])  COMPREHENSIVE METABOLIC PANEL  CBC WITH DIFFERENTIAL/PLATELET  URINALYSIS, COMPLETE (UACMP) WITH MICROSCOPIC  BRAIN NATRIURETIC PEPTIDE  LACTIC ACID, PLASMA  LACTIC ACID, PLASMA  PROTIME-INR  TROPONIN I (HIGH SENSITIVITY)  Pertinent labs  results that were available during my care of the patient were reviewed by me and considered in my medical decision making (see chart for details). ____________________________________________  EKG  I personally interpreted any EKGs ordered by me or triage Atrial fib rate 131, LAD noted, no acute ST elevation, intraventricular conduction delay noted. ____________________________________________  RADIOLOGY  Pertinent labs & imaging results that were available during my care of the patient were reviewed by me and considered in my medical decision making (see chart for details). If possible, patient and/or family made aware of any abnormal findings.  Dg Chest Port 1 View  Result Date: 06/30/2019 CLINICAL DATA:  Shortness of breath EXAM: PORTABLE CHEST 1 VIEW COMPARISON:  03/30/2019 FINDINGS: Cardiomegaly. Low lung volumes with bibasilar atelectasis. No overt edema or effusions. No acute bony abnormality. IMPRESSION: Cardiomegaly, low volumes with bibasilar atelectasis. Electronically Signed   By: Charlett NoseKevin  Dover M.D.   On: 06/30/2019 09:16   ____________________________________________    PROCEDURES  Procedure(s) performed: None  Procedures  Critical  Care performed: CRITICAL CARE Performed by: Jeanmarie PlantJAMES A Sonyia Muro   Total critical care time: 38 minutes  Critical care time was exclusive of separately billable procedures and treating other patients.  Critical care was necessary to treat or prevent imminent or life-threatening deterioration.  Critical care was time spent personally by me on the following activities: development of treatment plan with patient and/or surrogate as well as nursing, discussions with consultants, evaluation of patient's response to treatment, examination of patient, obtaining history from patient or surrogate, ordering and performing treatments and interventions, ordering and review of laboratory studies, ordering and review of radiographic studies, pulse oximetry and re-evaluation of patient's condition.   ____________________________________________   INITIAL IMPRESSION / ASSESSMENT AND PLAN / ED COURSE  Pertinent labs & imaging results that were available during my care of the patient were reviewed by me and considered in my medical decision making (see chart for details).   Patient minimally responsive to give Narcan with no response.  My suspicion was that he is retaining CO2 so I did place him on BiPAP.  His VBG does verify this.  Likely has been doing this all night.  Critical patient.  I did call family they confirmed that they do not want him intubated and they do not want any other resuscitation beyond BiPAP aside from medical intervention and supportive care.  They do agree to antibiotics however.  I will keep the family posted.  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  We are awaiting results from blood work and we will admit him to the hospital.  At this time he is not stable [facility] other [facility]    ____________________________________________   FINAL CLINICAL IMPRESSION(S) / ED  DIAGNOSES  Final diagnoses:  SOB (shortness of breath)      This chart was dictated using voice recognition software.  Despite best efforts to proofread,  errors can occur which can change meaning.      Jeanmarie PlantMcShane, Jackson Fetters A, MD 06/30/19 [id number]    Jeanmarie PlantMcShane, Janese Radabaugh A, MD 06/30/19 1150

## 2019-06-30 NOTE — ED Triage Notes (Signed)
Pt via ems from [facility]; was found unresponsive, 71% on 10L [place].  Pt on NRB upon arrival, placed on bipap per Dr. Burlene Arnt.

## 2019-06-30 NOTE — Consult Note (Signed)
Reason for Consult: Congestive heart failure diastolic dysfunction respiratory failure altered mental status Referring Physician: Dr. Alphonzo Lemmings emergency room . [name].  Dwayne Bridges is an 68 y.o. male.  HPI: Patient had COVID in the past he is a resident of nursing home on [facility] had acute and chronic respiratory failure with hypoxemia placed on BiPAP history of hyperlipidemia hypertension patient presented to [facility] with unresponsiveness.  Patient was found to have borderline troponins abnormal blood gas but is essentially unresponsive with elevated lactic acid  Past Medical History:  Diagnosis Date  . CHF (congestive heart failure) (HCC)   . Hypertension     No past surgical history on file.  No family history on file.  Social History:  reports that he quit smoking about 10 years ago. His smoking use included cigarettes. He has a 5.00 pack-year smoking history. He has never used smokeless tobacco. No history on file for alcohol and drug.  Allergies: No Known Allergies  Medications: I have reviewed the patient's current medications.  Results for orders placed or performed during the hospital encounter of 06/30/19 (from the past 48 hour(s))  Urinalysis, Complete w Microscopic     Status: Abnormal   Collection Time: 06/30/19  9:02 AM  Result Value Ref Range   Color, Urine AMBER (A) YELLOW    Comment: BIOCHEMICALS MAY BE AFFECTED BY COLOR   APPearance CLOUDY (A) CLEAR   Specific Gravity, Urine 1.019 1.005 - 1.030   pH 5.0 5.0 - 8.0   Glucose, UA NEGATIVE NEGATIVE mg/dL   Hgb urine dipstick NEGATIVE NEGATIVE   Bilirubin Urine NEGATIVE NEGATIVE   Ketones, ur NEGATIVE NEGATIVE mg/dL   Protein, ur 098 (A) NEGATIVE mg/dL   Nitrite NEGATIVE NEGATIVE   Leukocytes,Ua NEGATIVE NEGATIVE   RBC / HPF 0-5 0 - 5 RBC/hpf   WBC, UA 0-5 0 - 5 WBC/hpf   Bacteria, UA RARE (A) NONE SEEN   Squamous Epithelial / LPF 0-5 0 - 5   Mucus PRESENT    Hyaline Casts, UA PRESENT     Comment:  Performed at [facility], [address] [place], [place] [address]  Glucose, capillary     Status: Abnormal   Collection Time: 06/30/19  9:05 AM  Result Value Ref Range   Glucose-Capillary 178 (H) 70 - 99 mg/dL  Blood gas, venous     Status: Abnormal   Collection Time: 06/30/19  9:06 AM  Result Value Ref Range   FIO2 0.80    Delivery systems BILEVEL POSITIVE AIRWAY PRESSURE    pH, Ven 7.24 (L) 7.250 - 7.430   pCO2, Ven 112 (HH) 44.0 - 60.0 mmHg    Comment: CRITICAL RESULT CALLED TO, READ BACK BY AND VERIFIED WITH: DR Alphonzo Lemmings [id number] ABL    pO2, Ven 56.0 (H) 32.0 - 45.0 mmHg   Bicarbonate 48.0 (H) 20.0 - 28.0 mmol/L   Acid-Base Excess 15.3 (H) 0.0 - 2.0 mmol/L   O2 Saturation 83.1 %   Patient temperature 37.0    Collection site VEIN    Sample type VENOUS     Comment: Performed at [facility], [address] [place], [place] [address]  Comprehensive metabolic panel     Status: Abnormal   Collection Time: 06/30/19  9:06 AM  Result Value Ref Range   Sodium 139 135 - 145 mmol/L   Potassium 4.4 3.5 - 5.1 mmol/L   Chloride 90 (L) 98 - 111 mmol/L   CO2 36 (H) 22 -  32 mmol/L   Glucose, Bld 182 (H) 70 - 99 mg/dL   BUN 22 8 - 23 mg/dL   Creatinine, Ser 2.951.07 0.61 - 1.24 mg/dL   Calcium 8.9 8.9 - 62.110.3 mg/dL   Total Protein 8.0 6.5 - 8.1 g/dL   Albumin 3.5 3.5 - 5.0 g/dL   AST 24 15 - 41 U/L   ALT 17 0 - 44 U/L   Alkaline Phosphatase 104 38 - 126 U/L   Total Bilirubin 0.7 0.3 - 1.2 mg/dL   GFR calc non Af Amer >60 >60 mL/min   GFR calc Af Amer >60 >60 mL/min   Anion gap 13 5 - 15    Comment: Performed at [facility], [address] [place], [place] [address]  CBC with Differential     Status: Abnormal   Collection Time: 06/30/19  9:06 AM  Result Value Ref Range   WBC 9.7 4.0 - 10.5 K/uL   RBC 5.41 4.22 - 5.81 MIL/uL   Hemoglobin 16.1 13.0 - 17.0 g/dL   HCT 78.452.8 (H) 69.639.0 - 29.552.0 %   MCV 97.6 80.0 - 100.0 fL   MCH 29.8 26.0 - 34.0 pg    MCHC 30.5 30.0 - 36.0 g/dL   RDW 28.417.0 (H) 13.211.5 - 44.015.5 %   Platelets 233 150 - 400 K/uL   nRBC 0.2 0.0 - 0.2 %   Neutrophils Relative % 81 %   Neutro Abs 7.9 (H) 1.7 - 7.7 K/uL   Lymphocytes Relative 10 %   Lymphs Abs 1.0 0.7 - 4.0 K/uL   Monocytes Relative 6 %   Monocytes Absolute 0.6 0.1 - 1.0 K/uL   Eosinophils Relative 0 %   Eosinophils Absolute 0.0 0.0 - 0.5 K/uL   Basophils Relative 1 %   Basophils Absolute 0.1 0.0 - 0.1 K/uL   Immature Granulocytes 2 %   Abs Immature Granulocytes 0.20 (H) 0.00 - 0.07 K/uL    Comment: Performed at [facility], [address] [place], [place] [address]  Brain natriuretic peptide     Status: Abnormal   Collection Time: 06/30/19  9:06 AM  Result Value Ref Range   B Natriuretic Peptide 168.0 (H) 0.0 - 100.0 pg/mL    Comment: Performed at [facility], [address] [place], [place] [address]  Troponin I (High Sensitivity)     Status: Abnormal   Collection Time: 06/30/19  9:06 AM  Result Value Ref Range   Troponin I (High Sensitivity) 29 (H) <18 ng/L    Comment: (NOTE) Elevated high sensitivity troponin I (hsTnI) values and significant  changes across serial measurements may suggest ACS but many other  chronic and acute conditions are known to elevate hsTnI results.  Refer to the "Links" section for chest pain algorithms and additional  guidance. Performed at [facility], [address] [place], [place] [address]   Lactic acid, plasma     Status: Abnormal   Collection Time: 06/30/19  9:06 AM  Result Value Ref Range   Lactic Acid, Venous 3.6 (HH) 0.5 - 1.9 mmol/L    Comment: CRITICAL RESULT CALLED TO, READ BACK BY AND VERIFIED WITH C/ JESSICA FULCHER ON 06/30/2019  AT 1151 TIK/FMW Performed at [facility], [address] [place], [place] [address]   Lactic acid, plasma     Status: Abnormal   Collection Time: 06/30/19  9:06 AM  Result Value Ref Range   Lactic Acid, Venous 3.0 (HH) 0.5 - 1.9  mmol/L  Comment: CRITICAL RESULT CALLED TO, READ BACK BY AND VERIFIED WITH MARY [facility] 06/30/19 1008 KLW Performed at [facility], [address] [place], [place] [address]   Protime-INR     Status: Abnormal   Collection Time: 06/30/19  9:06 AM  Result Value Ref Range   Prothrombin Time 16.1 (H) 11.4 - 15.2 seconds   INR 1.3 (H) 0.8 - 1.2    Comment: (NOTE) INR goal varies based on device and disease states. Performed at [facility], [address] [place], [place] [address]   SARS Coronavirus 2 [facility](Hospital order, Performed in [facility] hospital lab) Nasopharyngeal Nasopharyngeal Swab     Status: None   Collection Time: 06/30/19  9:06 AM   Specimen: Nasopharyngeal Swab  Result Value Ref Range   SARS Coronavirus 2 NEGATIVE NEGATIVE    Comment: (NOTE) If result is NEGATIVE SARS-CoV-2 target nucleic acids are NOT DETECTED. The SARS-CoV-2 RNA is generally detectable in upper and lower  respiratory specimens during the acute phase of infection. The lowest  concentration of SARS-CoV-2 viral copies this assay can detect is 250  copies / mL. A negative result does not preclude SARS-CoV-2 infection  and should not be used as the sole basis for treatment or other  patient management decisions.  A negative result may occur with  improper specimen collection / handling, submission of specimen other  than nasopharyngeal swab, presence of viral mutation(s) within the  areas targeted by this assay, and inadequate number of viral copies  (<250 copies / mL). A negative result must be combined with clinical  observations, patient history, and epidemiological information. If result is POSITIVE SARS-CoV-2 target nucleic acids are DETECTED. The SARS-CoV-2 RNA is generally detectable in upper and lower  respiratory specimens dur ing the acute phase of infection.  Positive  results are indicative of active infection with SARS-CoV-2.  Clinical  correlation with patient  history and other diagnostic information is  necessary to determine patient infection status.  Positive results do  not rule out bacterial infection or co-infection with other viruses. If result is PRESUMPTIVE POSTIVE SARS-CoV-2 nucleic acids MAY BE PRESENT.   A presumptive positive result was obtained on the submitted specimen  and confirmed on repeat testing.  While 2019 novel coronavirus  (SARS-CoV-2) nucleic acids may be present in the submitted sample  additional confirmatory testing may be necessary for epidemiological  and / or clinical management purposes  to differentiate between  SARS-CoV-2 and other Sarbecovirus currently known to infect humans.  If clinically indicated additional testing with an alternate test  methodology [phone]([id number]) is advised. The SARS-CoV-2 RNA is generally  detectable in upper and lower respiratory sp ecimens during the acute  phase of infection. The expected result is Negative. Fact Sheet for Patients:  BoilerBrush.com.cyhttps://www.fda.gov/media/136312/download Fact Sheet for Healthcare Providers: https://pope.com/https://www.fda.gov/media/136313/download This test is not yet approved or cleared by the [place] FDA and has been authorized for detection and/or diagnosis of SARS-CoV-2 by FDA under an Emergency Use Authorization (EUA).  This EUA will remain in effect (meaning this test can be used) for the duration of the COVID-19 declaration under Section 564(b)(1) of the Act, 21 U.S.C. section 360bbb-3(b)(1), unless the authorization is terminated or revoked sooner. Performed at [facility], [address] [place], [place] [address]   Ammonia     Status: None   Collection Time: 06/30/19  9:06 AM  Result Value Ref Range   Ammonia 27 9 - 35 umol/L    Comment: Performed at  [facility], [address] [place], [place] [address]  Blood gas, arterial     Status: Abnormal   Collection Time: 06/30/19 10:29 AM  Result Value Ref Range   FIO2 0.35    Delivery  systems BILEVEL POSITIVE AIRWAY PRESSURE    pH, Arterial 7.35 7.350 - 7.450   pCO2 arterial 81 (HH) 32.0 - 48.0 mmHg    Comment: CRITICAL RESULT CALLED TO, READ BACK BY AND VERIFIED WITH: [facility] [phone] [id number] ABL    pO2, Arterial 72 (L) 83.0 - 108.0 mmHg   Bicarbonate 44.7 (H) 20.0 - 28.0 mmol/L   Acid-Base Excess 15.0 (H) 0.0 - 2.0 mmol/L   O2 Saturation 93.4 %   Patient temperature 37.0    Collection site RIGHT RADIAL    Sample type ARTERIAL DRAW    Allens test (pass/fail) PASS PASS    Comment: Performed at [facility], [address] [place], [place] [address]  Troponin I (High Sensitivity)     Status: Abnormal   Collection Time: 06/30/19 11:34 AM  Result Value Ref Range   Troponin I (High Sensitivity) 52 (H) <18 ng/L    Comment: READ BACK AND VERIFIED WITH  JESSICA FULCHER ON 06/30/2019 AT 1215 TIK/FMW (NOTE) Elevated high sensitivity troponin I (hsTnI) values and significant  changes across serial measurements may suggest ACS but many other  chronic and acute conditions are known to elevate hsTnI results.  Refer to the "Links" section for chest pain algorithms and additional  guidance. Performed at [facility], [address] [place], [place] [address]   Valproic acid level     Status: Abnormal   Collection Time: 06/30/19 11:34 AM  Result Value Ref Range   Valproic Acid Lvl 36 (L) 50.0 - 100.0 ug/mL    Comment: Performed at [facility], [address] [place], [place] [address]    Ct Head Wo Contrast  Result Date: 06/30/2019 CLINICAL DATA:  Altered mental status EXAM: CT HEAD WITHOUT CONTRAST TECHNIQUE: Contiguous axial images were obtained from the base of the skull through the vertex without intravenous contrast. COMPARISON:  None. FINDINGS: Brain: No evidence of acute infarction, hemorrhage, hydrocephalus, extra-axial collection or mass lesion/mass effect. Encephalomalacia in the right occipital lobe with ex vacuo dilatation of the  posterior horn of the right lateral ventricle suggesting sequela of remote prior infarct. Scattered low-density changes within the periventricular and subcortical white matter compatible with chronic microvascular ischemic changes. Vascular: Atherosclerotic calcifications involving the large vessels of the skull base. No unexpected hyperdense vessel. Skull: Normal. Negative for fracture or focal lesion. Sinuses/Orbits: Mild mucosal thickening in the anteromedial aspect of the right maxillary sinus. Remaining visualized paranasal sinuses and mastoid air cells are clear. Orbital structures are intact and unremarkable. Other: None. IMPRESSION: 1. No acute intracranial findings. 2. 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 Electronically Signed   By: Duanne GuessNicholas  Plundo M.D.   On: 06/30/2019 13:17   Dg Chest Port 1 View  Result Date: 06/30/2019 CLINICAL DATA:  Shortness of breath EXAM: PORTABLE CHEST 1 VIEW COMPARISON:  03/30/2019 FINDINGS: Cardiomegaly. Low lung volumes with bibasilar atelectasis. No overt edema or effusions. No acute bony abnormality. IMPRESSION: Cardiomegaly, low volumes with bibasilar atelectasis. Electronically Signed   By: Charlett NoseKevin  Dover M.D.   On: 06/30/2019 09:16    Review of Systems  Unable to perform ROS: Medical condition  Eyes: Negative.   Skin: Negative.    Blood pressure (!) 77/56, pulse (!) 42,  temperature 98.3 F (36.8 C), temperature source Axillary, resp. rate 16, height 5\' 10"  (1.778 m), weight 101.9 kg, SpO2 93 %. Physical Exam  Nursing note and vitals reviewed. Constitutional: He is oriented to person, place, and time. He appears well-developed and well-nourished.  HENT:  Head: Normocephalic and atraumatic.  Eyes: Pupils are equal, round, and reactive to light. Conjunctivae and EOM are normal.  Neck: Neck supple.  Cardiovascular: Normal rate, regular rhythm and normal heart sounds.  Respiratory: Effort normal and breath  sounds normal.  GI: Soft. Bowel sounds are normal.  Musculoskeletal: Normal range of motion.  Neurological: He is oriented to person, place, and time. He has normal reflexes.  Skin: Skin is warm.  Psychiatric: He has a normal mood and affect.    Assessment/Plan: Respiratory failure\ Altered mental status Unresponsive Smoker Previous COVID positive Congestive heart failure Hypertension Atrial fibrillation chronic Hyperlipidemia Borderline troponin . Plan Agree with admit to ICU Continue BiPAP with respiratory support Maintain blood pressure control Follow-up EKGs and troponins Maintain anticoagulation for atrial fibrillation Continue diltiazem for blood pressure and rate control Continue Lipitor for lipid management Continue medical therapy for diastolic heart failure   D  06/30/2019, 2:43 PM

## 2019-06-30 NOTE — Progress Notes (Signed)
*  PRELIMINARY RESULTS* Echocardiogram 2D Echocardiogram has been performed. Definity IV Contrast used on this study.  Dwayne Bridges 06/30/2019, 8:59 PM

## 2019-06-30 NOTE — ED Notes (Signed)
Pt sitting up in bed, opens eyes to voice but does not answer questions or respond verbally. Bipap in place.

## 2019-06-30 NOTE — ED Notes (Signed)
Pt given 2mg  narcan upon arrival per verbal order by Dr. Burlene Arnt

## 2019-06-30 NOTE — ED Notes (Signed)
ED TO INPATIENT HANDOFF REPORT  ED Nurse Name and Phone #: Shanda BumpsJessica [phone]   S Name/Age/Gender Dwayne Bridges 68 y.o. male Room/Bed: ED06A/ED06A  Code Status   Code Status: DNR  Home/SNF/Other Nursing Home Pt not a&o Is this baseline? No   Triage Complete: Triage complete  Chief Complaint sob  Triage Note Pt via ems from [facility]; was found unresponsive, 71% on 10L [place].  Pt on NRB upon arrival, placed on bipap per Dr. Alphonzo LemmingsMcShane.    Allergies No Known Allergies  Level of Care/Admitting Diagnosis ED Disposition    ED Disposition Condition Comment   Admit  [facility]ospital [facility] [[id number]]  Level of Care: Stepdown [14]  Covid Evaluation: Confirmed COVID Negative  Diagnosis: Acute respiratory failure with hypoxia [facility](HCC) [[id number]]  Admitting Physician: Cristie HemUMA, ELIZABETH ACHIENG [[id number]]  Attending Physician: Webb SilversmithUMA, ELIZABETH ACHIENG [[id number]]  Estimated length of stay: past midnight tomorrow  Certification:: I certify this patient will need inpatient services for at least 2 midnights  PT Class (Do Not Modify): Inpatient [101]  PT Acc Code (Do Not Modify): Private [1]       B Medical/Surgery History Past Medical History:  Diagnosis Date  . CHF (congestive heart failure) (HCC)   . Hypertension    No past surgical history on file.   A IV Location/Drains/Wounds Patient Lines/Drains/Airways Status   Active Line/Drains/Airways    Name:   Placement date:   Placement time:   Site:   Days:   Peripheral IV 06/30/19 Right Antecubital   06/30/19    -    Antecubital   less than 1   Peripheral IV 06/30/19 Left Antecubital   06/30/19    0915    Antecubital   less than 1   External Urinary Catheter   03/27/19    1605    -   95          Intake/Output Last 24 hours No intake or output data in the 24 hours ending 06/30/19 1123  Labs/Imaging Results for orders placed or performed during the hospital encounter of 06/30/19 (from the past 48 hour(s))   Glucose, capillary     Status: Abnormal   Collection Time: 06/30/19  9:05 AM  Result Value Ref Range   Glucose-Capillary 178 (H) 70 - 99 mg/dL  Blood gas, venous     Status: Abnormal   Collection Time: 06/30/19  9:06 AM  Result Value Ref Range   FIO2 0.80    Delivery systems BILEVEL POSITIVE AIRWAY PRESSURE    pH, Ven 7.24 (L) 7.250 - 7.430   pCO2, Ven 112 (HH) 44.0 - 60.0 mmHg    Comment: CRITICAL RESULT CALLED TO, READ BACK BY AND VERIFIED WITH: DR Alphonzo LemmingsMCSHANE [id number] ABL    pO2, Ven 56.0 (H) 32.0 - 45.0 mmHg   Bicarbonate 48.0 (H) 20.0 - 28.0 mmol/L   Acid-Base Excess 15.3 (H) 0.0 - 2.0 mmol/L   O2 Saturation 83.1 %   Patient temperature 37.0    Collection site VEIN    Sample type VENOUS     Comment: Performed at [facility], [address] [place], [place] [address]  Comprehensive metabolic panel     Status: Abnormal   Collection Time: 06/30/19  9:06 AM  Result Value Ref Range   Sodium 139 135 - 145 mmol/L   Potassium 4.4 3.5 - 5.1 mmol/L   Chloride 90 (L) 98 - 111 mmol/L   CO2 36 (H) 22 - 32 mmol/L  Glucose, Bld 182 (H) 70 - 99 mg/dL   BUN 22 8 - 23 mg/dL   Creatinine, Ser 1.611.07 0.61 - 1.24 mg/dL   Calcium 8.9 8.9 - 09.610.3 mg/dL   Total Protein 8.0 6.5 - 8.1 g/dL   Albumin 3.5 3.5 - 5.0 g/dL   AST 24 15 - 41 U/L   ALT 17 0 - 44 U/L   Alkaline Phosphatase 104 38 - 126 U/L   Total Bilirubin 0.7 0.3 - 1.2 mg/dL   GFR calc non Af Amer >60 >60 mL/min   GFR calc Af Amer >60 >60 mL/min   Anion gap 13 5 - 15    Comment: Performed at [facility], [address] [place], [place] [address]  CBC with Differential     Status: Abnormal   Collection Time: 06/30/19  9:06 AM  Result Value Ref Range   WBC 9.7 4.0 - 10.5 K/uL   RBC 5.41 4.22 - 5.81 MIL/uL   Hemoglobin 16.1 13.0 - 17.0 g/dL   HCT 98.152.8 (H) 19.139.0 - 47.852.0 %   MCV 97.6 80.0 - 100.0 fL   MCH 29.8 26.0 - 34.0 pg   MCHC 30.5 30.0 - 36.0 g/dL   RDW 29.517.0 (H) 62.111.5 - 30.815.5 %   Platelets 233 150 -  400 K/uL   nRBC 0.2 0.0 - 0.2 %   Neutrophils Relative % 81 %   Neutro Abs 7.9 (H) 1.7 - 7.7 K/uL   Lymphocytes Relative 10 %   Lymphs Abs 1.0 0.7 - 4.0 K/uL   Monocytes Relative 6 %   Monocytes Absolute 0.6 0.1 - 1.0 K/uL   Eosinophils Relative 0 %   Eosinophils Absolute 0.0 0.0 - 0.5 K/uL   Basophils Relative 1 %   Basophils Absolute 0.1 0.0 - 0.1 K/uL   Immature Granulocytes 2 %   Abs Immature Granulocytes 0.20 (H) 0.00 - 0.07 K/uL    Comment: Performed at [facility], [address] [place], [place] [address]  Brain natriuretic peptide     Status: Abnormal   Collection Time: 06/30/19  9:06 AM  Result Value Ref Range   B Natriuretic Peptide 168.0 (H) 0.0 - 100.0 pg/mL    Comment: Performed at [facility], [address] [place], [place] [address]  Troponin I (High Sensitivity)     Status: Abnormal   Collection Time: 06/30/19  9:06 AM  Result Value Ref Range   Troponin I (High Sensitivity) 29 (H) <18 ng/L    Comment: (NOTE) Elevated high sensitivity troponin I (hsTnI) values and significant  changes across serial measurements may suggest ACS but many other  chronic and acute conditions are known to elevate hsTnI results.  Refer to the "Links" section for chest pain algorithms and additional  guidance. Performed at [facility], [address] [place], [place] [address]   Lactic acid, plasma     Status: Abnormal   Collection Time: 06/30/19  9:06 AM  Result Value Ref Range   Lactic Acid, Venous 3.0 (HH) 0.5 - 1.9 mmol/L    Comment: CRITICAL RESULT CALLED TO, READ BACK BY AND VERIFIED WITH MARY [facility] 06/30/19 1008 KLW Performed at [facility], [address] [place], [place] [address]   Protime-INR     Status: Abnormal   Collection Time: 06/30/19  9:06 AM  Result Value Ref Range   Prothrombin Time 16.1 (H) 11.4 - 15.2 seconds   INR 1.3 (H) 0.8 - 1.2    Comment: (NOTE)  INR goal varies based on device and disease  states. Performed at [facility], [address] [place], [place] [address]   SARS Coronavirus 2 [facility] order, Performed in [facility] hospital lab) Nasopharyngeal Nasopharyngeal Swab     Status: None   Collection Time: 06/30/19  9:06 AM   Specimen: Nasopharyngeal Swab  Result Value Ref Range   SARS Coronavirus 2 NEGATIVE NEGATIVE    Comment: (NOTE) If result is NEGATIVE SARS-CoV-2 target nucleic acids are NOT DETECTED. The SARS-CoV-2 RNA is generally detectable in upper and lower  respiratory specimens during the acute phase of infection. The lowest  concentration of SARS-CoV-2 viral copies this assay can detect is 250  copies / mL. A negative result does not preclude SARS-CoV-2 infection  and should not be used as the sole basis for treatment or other  patient management decisions.  A negative result may occur with  improper specimen collection / handling, submission of specimen other  than nasopharyngeal swab, presence of viral mutation(s) within the  areas targeted by this assay, and inadequate number of viral copies  (<250 copies / mL). A negative result must be combined with clinical  observations, patient history, and epidemiological information. If result is POSITIVE SARS-CoV-2 target nucleic acids are DETECTED. The SARS-CoV-2 RNA is generally detectable in upper and lower  respiratory specimens dur ing the acute phase of infection.  Positive  results are indicative of active infection with SARS-CoV-2.  Clinical  correlation with patient history and other diagnostic information is  necessary to determine patient infection status.  Positive results do  not rule out bacterial infection or co-infection with other viruses. If result is PRESUMPTIVE POSTIVE SARS-CoV-2 nucleic acids MAY BE PRESENT.   A presumptive positive result was obtained on the submitted specimen  and confirmed on repeat testing.  While 2019 novel coronavirus  (SARS-CoV-2) nucleic acids may  be present in the submitted sample  additional confirmatory testing may be necessary for epidemiological  and / or clinical management purposes  to differentiate between  SARS-CoV-2 and other Sarbecovirus currently known to infect humans.  If clinically indicated additional testing with an alternate test  methodology [phone]) is advised. The SARS-CoV-2 RNA is generally  detectable in upper and lower respiratory sp ecimens during the acute  phase of infection. The expected result is Negative. Fact Sheet for Patients:  StrictlyIdeas.no Fact Sheet for Healthcare Providers: BankingDealers.co.za This test is not yet approved or cleared by the [place] FDA and has been authorized for detection and/or diagnosis of SARS-CoV-2 by FDA under an Emergency Use Authorization (EUA).  This EUA will remain in effect (meaning this test can be used) for the duration of the COVID-19 declaration under Section 564(b)(1) of the Act, 21 U.S.C. section 360bbb-3(b)(1), unless the authorization is terminated or revoked sooner. Performed at [facility], [address] [place], [place] [address]   Ammonia     Status: None   Collection Time: 06/30/19  9:06 AM  Result Value Ref Range   Ammonia 27 9 - 35 umol/L    Comment: Performed at [facility], [address] [place], [place] [address]  Blood gas, arterial     Status: Abnormal   Collection Time: 06/30/19 10:29 AM  Result Value Ref Range   FIO2 0.35    Delivery systems BILEVEL POSITIVE AIRWAY PRESSURE    pH, Arterial 7.35 7.350 - 7.450   pCO2 arterial 81 (HH) 32.0 - 48.0 mmHg    Comment: CRITICAL RESULT CALLED TO,  READ BACK BY AND VERIFIED WITH: DR [facility], CooperMCSHANE [id number] ABL    pO2, Arterial 72 (L) 83.0 - 108.0 mmHg   Bicarbonate 44.7 (H) 20.0 - 28.0 mmol/L   Acid-Base Excess 15.0 (H) 0.0 - 2.0 mmol/L   O2 Saturation 93.4 %   Patient temperature 37.0    Collection site RIGHT  RADIAL    Sample type ARTERIAL DRAW    Allens test (pass/fail) PASS PASS    Comment: Performed at [facility], [address] [place], [place] [address]   Dg Chest Port 1 View  Result Date: 06/30/2019 CLINICAL DATA:  Shortness of breath EXAM: PORTABLE CHEST 1 VIEW COMPARISON:  03/30/2019 FINDINGS: Cardiomegaly. Low lung volumes with bibasilar atelectasis. No overt edema or effusions. No acute bony abnormality. IMPRESSION: Cardiomegaly, low volumes with bibasilar atelectasis. Electronically Signed   By: Charlett NoseKevin  Dover M.D.   On: 06/30/2019 09:16    Pending Labs Unresulted Labs (From admission, onward)    Start     Ordered   06/30/19 1106  Valproic acid level  Add-on,   AD     06/30/19 1106   06/30/19 0903  Lactic acid, plasma  Now then every 2 hours,   STAT     06/30/19 0902   06/30/19 0902  Urinalysis, Complete w Microscopic  ONCE - STAT,   STAT     06/30/19 0902   06/30/19 0902  Urine culture  Once,   STAT     06/30/19 0902   06/30/19 0902  Culture, blood (routine x 2)  BLOOD CULTURE X 2,   STAT     06/30/19 0902          Vitals/Pain Today's Vitals   06/30/19 0930 06/30/19 1000 06/30/19 1030 06/30/19 1100  BP: (!) 153/106 115/76 99/82 109/90  Pulse:      Resp: 15 12 20 18   Temp:      TempSrc:      SpO2:      Weight:      Height:        Isolation Precautions No active isolations  Medications Medications  acetaminophen (TYLENOL) tablet 650 mg (has no administration in time range)  atorvastatin (LIPITOR) tablet 20 mg (has no administration in time range)  diltiazem (CARDIZEM CD) 24 hr capsule 180 mg (has no administration in time range)  lisinopril (ZESTRIL) tablet 5 mg (has no administration in time range)  terazosin (HYTRIN) capsule 10 mg (has no administration in time range)  furosemide (LASIX) injection 40 mg (has no administration in time range)  traZODone (DESYREL) tablet 50 mg (has no administration in time range)  sennosides-docusate sodium  (SENOKOT-S) 8.6-50 MG tablet 1 tablet (has no administration in time range)  finasteride (PROSCAR) tablet 5 mg (has no administration in time range)  apixaban (ELIQUIS) tablet 5 mg (has no administration in time range)  divalproex (DEPAKOTE SPRINKLE) capsule 375 mg (has no administration in time range)  multivitamin tablet 1 tablet (has no administration in time range)  potassium chloride SA (K-DUR) CR tablet 20 mEq (has no administration in time range)  naloxone (NARCAN) injection 2 mg (2 mg Intravenous Given 06/30/19 0850)    Mobility non-ambulatory     Focused Assessments 1   R Recommendations: See Admitting Provider Note  Report given to:   Additional Notes:

## 2019-06-30 NOTE — Consult Note (Signed)
Name: Dwayne NeedsJohn W Bridges MRN: [id number] DOB: 1951-09-14    ADMISSION DATE: 06/30/2019 CONSULTATION DATE: 06/30/2019  REFERRING MD :  Webb SilversmithElizabeth Ouma, NP  CHIEF COMPLAINT:  Unresponsive, hypoxic    HISTORY OF PRESENT ILLNESS:   68 y.o male with a PMH of HTN, CHF with unknown EF, atrial fibrillation, BPH, oxygen dependence, recent admission 03/27/19 for hypoxia in the presence of COVID-19 positive. He resides at [facility] where he was found unresponsive with SpO2 of 71% on 10L [place], EMS placed him on a NRB and transported to ED here he was then placed on BiPAP.  Transferring to the ICU    Labs in the ED: CBC negative for leukocytosis, CMP showing elevated CO2 (36), low chloride (90), BG 182. VBG initially done, followed by ABG demonstrating respiratory acidosis with metabolic alkalosis (7.35/81/72/44/15.0), with an elevated lactic acid (3.6). UA negative leukocytes or nitrates, but positive for proteins. Ammonia (27). High sensitivity troponin's trending up (29, 52). BNP (168), EKG- atrial fibrillation. CXR-cardiomegaly with decreased volume, bibasilar atelectasis. Echo ordered.   SIGNIFICANT EVENTS/STUDIES: 08/18> Unresponsive, hypoxic, placed on BiPAP in ED 08/18> Admitted to ICU requiring BiPAP 08/18> CT Head- no acute findings  08/18> Echo ordered  CULTURES: 08/18> COVID-19- negative 08/18> Blood culture- active, not collected 08/18> Urine culture- active, not collected  PAST MEDICAL HISTORY :   has a past medical history of CHF (congestive heart failure) (HCC) and Hypertension.  has no past surgical history on file. No Known Allergies  FAMILY HISTORY:  family history is not on file. SOCIAL HISTORY:  reports that he quit smoking about 10 years ago. His smoking use included cigarettes. He has a 5.00 pack-year smoking history. He has never used smokeless tobacco.  REVIEW OF SYSTEMS:   Unable to obtain, patient somnolent currently on BiPAP. Opens eyes to painful stimuli    VITAL SIGNS: Temp:  [97.7 F (36.5 C)] 97.7 F (36.5 C) (08/18 0913) Pulse Rate:  [75-123] 75 (08/18 1130) Resp:  [12-20] 18 (08/18 1100) BP: (99-153)/(76-106) 125/86 (08/18 1130) SpO2:  [98 %-100 %] 99 % (08/18 1130) FiO2 (%):  [35 %] 35 % (08/18 1039) Weight:  [108.6 kg] 108.6 kg (08/18 0910)   No intake/output data recorded. No intake/output data recorded.   SpO2: 99 % FiO2 (%): 35 %   Physical Examination:  Physical Exam  Constitutional: He has a sickly appearance.  Currently on BiPAP   HENT:  Head: Normocephalic and atraumatic.  Eyes: Pupils are equal, round, and reactive to light. No scleral icterus.  Neck: No JVD present.  Cardiovascular: Normal rate. An irregularly irregular rhythm present. Exam reveals distant heart sounds.  Pulmonary/Chest: He is in respiratory distress. He has wheezes. He has rhonchi.  Requiring BiPAP  Abdominal: Soft. Bowel sounds are normal. He exhibits no distension.  Musculoskeletal:        General: No edema.  Neurological:  somnolent , eyes open to painful stimuli, withdraws from painful stimuli    Skin: Skin is warm and dry. He is not diaphoretic.    I personally reviewed lab work that was obtained in last 24 hrs. CBC Latest Ref Rng & Units 06/30/2019 03/31/2019 03/30/2019  WBC 4.0 - 10.5 K/uL 9.7 4.2 4.8  Hemoglobin 13.0 - 17.0 g/dL 40.916.1 11.0(L) 11.1(L)  Hematocrit 39.0 - 52.0 % 52.8(H) 35.9(L) 37.0(L)  Platelets 150 - 400 K/uL 233 188 182   CMP Latest Ref Rng & Units 06/30/2019 03/31/2019 03/30/2019  Glucose 70 - 99 mg/dL 811(B182(H) 90 147(W121(H)  BUN 8 - 23 mg/dL 22 18 21   Creatinine 0.61 - 1.24 mg/dL 1.611.07 0.960.74 0.450.83  Sodium 135 - 145 mmol/L 139 137 136  Potassium 3.5 - 5.1 mmol/L 4.4 3.9 4.5  Chloride 98 - 111 mmol/L 90(L) 94(L) 96(L)  CO2 22 - 32 mmol/L 36(H) 32 32  Calcium 8.9 - 10.3 mg/dL 8.9 4.0(J8.5(L) 8.1(X8.4(L)  Total Protein 6.5 - 8.1 g/dL 8.0 6.9 7.3  Total Bilirubin 0.3 - 1.2 mg/dL 0.7 0.6 0.6  Alkaline Phos 38 - 126 U/L 104 57 61   AST 15 - 41 U/L 24 41 45(H)  ALT 0 - 44 U/L 17 33 31   BNP    Component Value Date/Time   BNP 168.0 (H) 06/30/2019 0906   Lactic Acid, Venous    Component Value Date/Time   LATICACIDVEN 3.6 (HH) 06/30/2019 0906   LATICACIDVEN 3.0 (HH) 06/30/2019 0906   ABG    Component Value Date/Time   PHART 7.35 06/30/2019 1029   PCO2ART 81 (HH) 06/30/2019 1029   PO2ART 72 (L) 06/30/2019 1029   HCO3 44.7 (H) 06/30/2019 1029   O2SAT 93.4 06/30/2019 1029    MEDICATIONS: I have reviewed all medications and confirmed regimen as documented   IMAGING     Dg Chest Port 1 View  Result Date: 06/30/2019 CLINICAL DATA:  Shortness of breath EXAM: PORTABLE CHEST 1 VIEW COMPARISON:  03/30/2019 FINDINGS: Cardiomegaly. Low lung volumes with bibasilar atelectasis. No overt edema or effusions. No acute bony abnormality. IMPRESSION: Cardiomegaly, low volumes with bibasilar atelectasis. Electronically Signed   By: Charlett NoseKevin  Dover M.D.   On: 06/30/2019 09:16     ASSESSMENT AND PLAN SYNOPSIS  68 y.o male somnolent with acute on chronic hypoxic and hypercapnic respiratory failure requiring BiPAP in the presence of metabolic encephalopathy.    Severe acute on chronic  Hypoxic and Hypercapnic Respiratory Failure -Requiring BiPAP, continue PRN -Wean Fio2 and PEEP as tolerated -Duoneb nebulizer treatment  -TSH ordered   Metabolic Encephalopathy  -CT head- no acute findings.    CARDIAC FAILURE- EF unknown -Cardiology consulted  -Continue BiPAP PRN -D/C lasix > 08/18  -ECHO ordered   Atrial Fibrillation - chronic  ICU monitoring -Continue Eliquis   ID -follow up cultures  GI  GI PROPHYLAXIS as indicated -Senna docusate  NUTRITIONAL STATUS DIET-->NPO while requiring BiPAP Constipation protocol as indicated   ENDO - will use ICU hypoglycemic\Hyperglycemia protocol if needed  ELECTROLYTES -follow labs as needed -replace as needed -pharmacy consultation and following   DVT/GI PRX - on  eliquis for A-fib  TRANSFUSIONS AS NEEDED MONITOR FSBS ASSESS the need for LABS   Hervey ArdJulie McCarter, PA student    PCCM ATTENDING ATTESTATION: I have evaluated patient with the PA student, reviewed database in its entirety and discussed care plan in detail. In addition, this patient was discussed on multidisciplinary rounds. I have personally reviewed all chest radiographs discussed herein including CXRs and CT chest unless otherwise indicated  Important exam findings: Somnolent on BiPAP but able to follow commands No excessive work of breathing HEENT: NCAT, sclerae white, edentulous Neck: No JVD noted Chest: Diminished breath sounds without wheezes Cardiac:IRIR, rate controlled, no M Abd: soft, NT, +BS Ext: Chronic stasis changes, no pitting edema Neuro: No focal deficits noted  Major problems addressed by PCCM team: Acute/chronic hypercarbic respiratory failure - exact etiology is not clear.  Probably has some underlying COPD.  He is only moderately obese but there might be a component of obesity hypoventilation syndrome.  He does have  a documented history of obstructive sleep apnea.  His recent COVID-19 infection is probably irrelevant to his current problem.  Chronic atrial fibrillation, well controlled  Overall poor functional status.  SNF resident   PLAN/REC: Continue BiPAP as needed Supplemental oxygen carefully titrated to keep SPO2 88-93% range Nebulized bronchodilators as needed for wheezing Continue current cardiac medications Avoid all sedating agents DNR status is appropriate   Merton Border, MD PCCM service Mobile [phone] Pager [phone] 07/01/2019 2:00 PM

## 2019-06-30 NOTE — H&P (Addendum)
[employer] [facility] [facility] [place] [facility]   PATIENT NAME: Jannet MantisJohn Araque    MR#:  [id number]  DATE OF BIRTH:  [birth year]  DATE OF ADMISSION:  06/30/2019  PRIMARY CARE PHYSICIAN: Patient, [name] Per   REQUESTING/REFERRING PHYSICIAN: Ileana RoupMcShane, James, MD  CHIEF COMPLAINT:   Chief Complaint  Patient presents with   unresponsive    HISTORY OF PRESENT ILLNESS:   68 year old male with past medical history of atrial fibrillation on Eliquis, diastolic CHF, acute on chronic respiratory failure with hypoxia, BPH, recent COVID +03/27/2019, hyperlipidemia and hypertension [facility] with [facility].  Patient remains unresponsive therefore history mostly obtained from ED report.  Per ED notes, patient was [facility].  When EMS arrived patient was noted to be unresponsive and hypoxic therefore placed on 10 L of oxygen but was still satting 71% therefore he was placed on non-rebreather.  Unclear what patient's baseline mental status is prior to this episode. No reports of any associated symptoms of shortness of breath, fevers or chills, chest pain, nausea or vomiting. On review of his chart he was admitted on 03/2019 with acute hypoxic respiratory failure due to acute COVID-19 viral illness.  He was discharged on 2 L nasal cannula oxygen at nighttime scheduled with outpatient sleep study.  Unclear if he has had a sleep study done or not.  On arrival to the ED, he was afebrile with blood pressure 130/944 mm Hg and pulse rate 116 beats/min. There were no focal neurological deficits; he was minimally responsive to deep sternal rub and only opens his eyes to noxious stimuli.  Patient was placed on BiPAP due to concerns of possible retaining CO2.  He was given Narcan with no response.  Initial labs revealed blood gas pH 7.25, PCO2 112, bicarb 48, CBC and CMP unremarkable lactic acid 3.4, BNP 168, troponin 29, and ammonia 27.   Chest x-ray showed cardiomegaly, low volumes with bibasilar atelectasis.  Will be admitted to the stepdown unit under hospitalist service for further management.  PAST MEDICAL HISTORY:   Past Medical History:  Diagnosis Date   CHF (congestive heart failure) (HCC)    Hypertension     PAST SURGICAL HISTORY:  No past surgical history on file.  SOCIAL HISTORY:   Social History   Tobacco Use   Smoking status: Former Smoker    Packs/day: 0.50    Years: 10.00    Pack years: 5.00    Types: Cigarettes    Quit date: 11/12/2008    Years since quitting: 10.6   Smokeless tobacco: Never Used  Substance Use Topics   Alcohol use: Not on file    FAMILY HISTORY:  No family history on file.  DRUG ALLERGIES:  No Known Allergies  REVIEW OF SYSTEMS:   ROS Unable to obtain from patient due to unresponsiveness MEDICATIONS AT HOME:   Prior to Admission medications   Medication Sig Start Date End Date Taking? Authorizing Provider  acetaminophen (TYLENOL) 325 MG tablet Take 650 mg by mouth every 6 (six) hours as needed (leg pain).   Yes [provider]  apixaban (ELIQUIS) 5 MG TABS tablet Take 1 tablet (5 mg total) by mouth 2 (two) times daily. 03/31/19  Yes Leroy SeaSingh, Prashant K, MD  atorvastatin (LIPITOR) 20 MG tablet Take 20 mg by mouth at bedtime.    Yes [provider]  diltiazem (CARDIZEM CD) 180 MG 24 hr capsule Take 1 capsule (180 mg total)  by mouth daily. 03/31/19 03/30/20 Yes Thurnell Lose, MD  divalproex (DEPAKOTE SPRINKLE) 125 MG capsule Take 375 mg by mouth 2 (two) times daily.   Yes [provider]  finasteride (PROSCAR) 5 MG tablet Take 5 mg by mouth daily.   Yes [provider]  furosemide (LASIX) 40 MG tablet Take 1 tablet (40 mg total) by mouth 2 (two) times daily. 03/31/19  Yes Thurnell Lose, MD  Multiple Vitamin (MULTIVITAMIN) tablet Take 1 tablet by mouth daily.   Yes [provider]  potassium chloride (K-DUR) 20 MEQ  tablet Take 1 tablet (20 mEq total) by mouth daily. 03/31/19  Yes Thurnell Lose, MD  sennosides-docusate sodium (SENOKOT-S) 8.6-50 MG tablet Take 1 tablet by mouth 2 (two) times daily.   Yes [provider]  terazosin (HYTRIN) 10 MG capsule Take 10 mg by mouth at bedtime.   Yes [provider]  traZODone (DESYREL) 50 MG tablet Take 50 mg by mouth at bedtime.   Yes [provider]  lisinopril (ZESTRIL) 5 MG tablet Take 1 tablet (5 mg total) by mouth daily. Patient not taking: Reported on 06/30/2019 03/31/19   Thurnell Lose, MD      VITAL SIGNS:  Blood pressure 109/90, pulse (!) 123, temperature 97.7 F (36.5 C), temperature source Axillary, resp. rate 18, height 5\' 10"  (1.778 m), weight 108.6 kg, SpO2 100 %.  PHYSICAL EXAMINATION:   Physical Exam  GENERAL:  68 y.o.-year-old morbidly obese patient lying in the bed on BiPAP EYES: Pupils equal, round, reactive to light and accommodation. No scleral icterus. Extraocular muscles intact.  HEENT: Head atraumatic, normocephalic. Oropharynx and nasopharynx clear.  NECK:  Supple, no jugular venous distention. No thyroid enlargement, no tenderness.  LUNGS: Decreased breath sounds bilaterally, no wheezing, rales,rhonchi or crepitation. No use of accessory muscles of respiration.  CARDIOVASCULAR: S1, S2 normal. No murmurs, rubs, or gallops.  ABDOMEN: Soft, nontender, nondistended. Bowel sounds present. No organomegaly or mass.  EXTREMITIES: Mild bilateral edema lower extremities, cyanosis, or clubbing. No rash or lesions. + pedal pulses MUSCULOSKELETAL: Moves bilateral upper and lower extremity against gravity to noxious stimuli NEUROLOGIC: Patient does not respond to verbal stimuli.  Opens eyes to deep sternal rub.  Does not follow commands.  No verbalizations noted.  Unable to assess CN 2-12.  Withdraws bilateral upper and lower extremities to noxious stimuli.  Gait not tested due to safety concern. PSYCHIATRIC: Unable  to assess SKIN: No obvious rash, lesion, or ulcer.   DATA REVIEWED:  LABORATORY PANEL:   CBC Recent Labs  Lab 06/30/19 0906  WBC 9.7  HGB 16.1  HCT 52.8*  PLT 233   ------------------------------------------------------------------------------------------------------------------  Chemistries  Recent Labs  Lab 06/30/19 0906  NA 139  K 4.4  CL 90*  CO2 36*  GLUCOSE 182*  BUN 22  CREATININE 1.07  CALCIUM 8.9  AST 24  ALT 17  ALKPHOS 104  BILITOT 0.7   ------------------------------------------------------------------------------------------------------------------  Cardiac Enzymes No results for input(s): TROPONINI in the last 168 hours. ------------------------------------------------------------------------------------------------------------------  RADIOLOGY:  Dg Chest Port 1 View  Result Date: 06/30/2019 CLINICAL DATA:  Shortness of breath EXAM: PORTABLE CHEST 1 VIEW COMPARISON:  03/30/2019 FINDINGS: Cardiomegaly. Low lung volumes with bibasilar atelectasis. No overt edema or effusions. No acute bony abnormality. IMPRESSION: Cardiomegaly, low volumes with bibasilar atelectasis. Electronically Signed   By: Rolm Baptise M.D.   On: 06/30/2019 09:16    EKG:  EKG: atrial fibrillation, rate 131. Vent. rate 131 BPM PR interval *  ms QRS duration 102 ms QT/QTc 334/494 ms P-R-T axes * -24 18 IMPRESSION AND PLAN:   68 y.o. male with past medical history of atrial fibrillation on Eliquis, diastolic CHF, acute on chronic respiratory failure with hypoxia, BPH, recent COVID +03/27/2019, hyperlipidemia and hypertension [facility].  1.  Acute on chronic respiratory failure with hypoxia -likely multifactorial in a patient with history of recent COVID-19 infection, CHF, morbid obesity and underlying untreated OSA -* No evidence of infectious process - Admit to ICU - Continue BiPAP - COVID-19 negative - Management per ICU  team  2. Acute on chronic Diastolic Congestive Heart Failure:  BNP mildly elevated at 168 - Chest x-ray shows low volumes with bibasilar atelectasis   Last Echo 03/23/2008 , EF 65% - Continue lisinopril - Diuretics: Furosemide 40mg  IV BID. Diureses >1L negative per day until approach euvolemia / worsening renal function. - Echocardiogram pending - Low salt diet  - Check daily weight - Strict I&Os - Cardiology Consult  3. Altered mental status -likely due to hypoxemia -On BiPAP as above -Trend ABGs -Check CT head without contrast -Check Depakote levels  4. Elevated troponin -likely due to demand ischemia -Trend troponin  5. Paroxysmal atrial fibrillation -continue Eliquis for anticoagulation -Rate controlled hold diltiazem  6. HTN  + Goal BP <130/80 - Continue lisinopril  7. HLD  + Goal LDL<100 - Atorvastatin 20mg  PO qhs  8. DVT prophylaxis - Therapeutically anti-coagulated with Eliquis   All the records are reviewed and case discussed with ED provider. Management plans discussed with the patient, family and they are in agreement.  CODE STATUS: DNR  TOTAL TIME TAKING CARE OF THIS PATIENT: 50 minutes.    on 06/30/2019 at 11:47 AM  Webb SilversmithElizabeth Marquette Piontek, DNP, FNP-BC [facility] Nurse Practitioner Between 7am to 6pm - Pager [phone]- [phone]  After 6pm go to [email or web address] - password [email or web address]  [facility] [place] [facility]  Office  [phone]  CC: Primary care physician; Patient, [name] Per

## 2019-07-01 ENCOUNTER — Other Ambulatory Visit: Payer: Self-pay

## 2019-07-01 ENCOUNTER — Inpatient Hospital Stay: Payer: Medicaid Other

## 2019-07-01 LAB — BLOOD CULTURE ID PANEL (REFLEXED)

## 2019-07-01 LAB — CBC
HCT: 46.1 % (ref 39.0–52.0)
Hemoglobin: 14.4 g/dL (ref 13.0–17.0)
MCH: 29.6 pg (ref 26.0–34.0)
MCHC: 31.2 g/dL (ref 30.0–36.0)
MCV: 94.9 fL (ref 80.0–100.0)
Platelets: 190 10*3/uL (ref 150–400)
RBC: 4.86 MIL/uL (ref 4.22–5.81)
RDW: 17.2 % — ABNORMAL HIGH (ref 11.5–15.5)
WBC: 9.1 10*3/uL (ref 4.0–10.5)
nRBC: 0 % (ref 0.0–0.2)

## 2019-07-01 LAB — TSH: TSH: 1.419 u[IU]/mL (ref 0.350–4.500)

## 2019-07-01 LAB — ECHOCARDIOGRAM COMPLETE
Height: 70 in
Weight: 3594.38 oz

## 2019-07-01 LAB — URINE CULTURE

## 2019-07-01 LAB — BASIC METABOLIC PANEL
Anion gap: 11 (ref 5–15)
BUN: 27 mg/dL — ABNORMAL HIGH (ref 8–23)
CO2: 37 mmol/L — ABNORMAL HIGH (ref 22–32)
Calcium: 9.3 mg/dL (ref 8.9–10.3)
Chloride: 94 mmol/L — ABNORMAL LOW (ref 98–111)
Creatinine, Ser: 0.86 mg/dL (ref 0.61–1.24)
GFR calc Af Amer: >60 mL/min (ref >60)
GFR calc non Af Amer: >60 mL/min (ref >60)
Glucose, Bld: 116 mg/dL — ABNORMAL HIGH (ref 70–99)
Potassium: 4.3 mmol/L (ref 3.5–5.1)
Sodium: 142 mmol/L (ref 135–145)

## 2019-07-01 MED ORDER — STERILE WATER FOR INJECTION IJ SOLN
INTRAMUSCULAR | Status: AC
  Administered 2019-07-01: 11:00:00 10 mL
  Filled 2019-07-01: qty 10

## 2019-07-01 MED ORDER — ACETAZOLAMIDE SODIUM 500 MG IJ SOLR
250.0000 mg | Freq: Once | INTRAMUSCULAR | Status: AC
  Administered 2019-07-01: 11:00:00 250 mg via INTRAVENOUS
  Filled 2019-07-01: qty 250

## 2019-07-01 MED ORDER — IPRATROPIUM-ALBUTEROL 0.5-2.5 (3) MG/3ML IN SOLN: 3.0000 mL | Freq: Four times a day (QID) | RESPIRATORY_TRACT | Status: DC | PRN

## 2019-07-01 NOTE — Progress Notes (Signed)
PHARMACY - PHYSICIAN COMMUNICATION CRITICAL VALUE ALERT - BLOOD CULTURE IDENTIFICATION (BCID)  Dwayne Bridges is an 68 y.o. male who presented to [facility] on 06/30/2019  Assessment:  1 bottle staph species, mecA negative  Name of physician (or Provider) Contacted: Dr. Alva Garnet  Current antibiotics: none  Changes to prescribed antibiotics recommended: none (likely contaminant)  Results for orders placed or performed during the hospital encounter of 06/30/19  Blood Culture ID Panel (Reflexed) (Collected: 06/30/2019  9:06 AM)  Result Value Ref Range   Enterococcus species NOT DETECTED NOT DETECTED   Listeria monocytogenes NOT DETECTED NOT DETECTED   Staphylococcus species DETECTED (A) NOT DETECTED   Staphylococcus aureus (BCID) NOT DETECTED NOT DETECTED   Methicillin resistance NOT DETECTED NOT DETECTED   Streptococcus species NOT DETECTED NOT DETECTED   Streptococcus agalactiae NOT DETECTED NOT DETECTED   Streptococcus pneumoniae NOT DETECTED NOT DETECTED   Streptococcus pyogenes NOT DETECTED NOT DETECTED   Acinetobacter baumannii NOT DETECTED NOT DETECTED   Enterobacteriaceae species NOT DETECTED NOT DETECTED   Enterobacter cloacae complex NOT DETECTED NOT DETECTED   Escherichia coli NOT DETECTED NOT DETECTED   Klebsiella oxytoca NOT DETECTED NOT DETECTED   Klebsiella pneumoniae NOT DETECTED NOT DETECTED   Proteus species NOT DETECTED NOT DETECTED   Serratia marcescens NOT DETECTED NOT DETECTED   Haemophilus influenzae NOT DETECTED NOT DETECTED   Neisseria meningitidis NOT DETECTED NOT DETECTED   Pseudomonas aeruginosa NOT DETECTED NOT DETECTED   Candida albicans NOT DETECTED NOT DETECTED   Candida glabrata NOT DETECTED NOT DETECTED   Candida krusei NOT DETECTED NOT DETECTED   Candida parapsilosis NOT DETECTED NOT DETECTED   Candida tropicalis NOT DETECTED NOT DETECTED    Tawnya Crook, PharmD 07/01/2019  1:15 PM

## 2019-07-01 NOTE — Progress Notes (Signed)
Pt transferred from CCU /  BP 76/53 ( MAP 59) on arrival to floor/ pt asymptomatic/ Dr. Posey Pronto made aware/ orders to monitor pt closely

## 2019-07-01 NOTE — Progress Notes (Signed)
Name: Dwayne Bridges MRN:   [id number] DOB:   Dec 30, 1950           ADMISSION DATE: 06/30/2019 CONSULTATION DATE: 06/30/2019  REFERRING MD :  Webb SilversmithElizabeth Ouma, NP  CHIEF COMPLAINT:  Unresponsive, hypoxic    HISTORY OF PRESENT ILLNESS:   68 y.o male with a PMH of HTN, CHF with unknown EF, atrial fibrillation, BPH, oxygen dependence, recent admission 03/27/19 for hypoxia in the presence of COVID-19 positive. [facility] where he was found unresponsive with SpO2 of 71% on 10L [place], EMS placed him on a NRB and transported to ED here he was then placed on BiPAP.  Transferring to the ICU    Labs in the ED: CBC negative for leukocytosis, CMP showing elevated CO2 (36), low chloride (90), BG 182. VBG initially done, followed by ABG demonstrating respiratory acidosis with metabolic alkalosis (7.35/81/72/44/15.0), with an elevated lactic acid (3.6). UA negative leukocytes or nitrates, but positive for proteins. Ammonia (27). High sensitivity troponin's trending up (29, 52). BNP (168), EKG- atrial fibrillation. CXR-cardiomegaly with decreased volume, bibasilar atelectasis. Echo ordered.   EVENTS OVERNIGHT:  Patient is awake this morning and alert to self. GCS 14.  BiPAP removed, tolerating RA with SpO2 >90%   SIGNIFICANT EVENTS/STUDIES: 08/18> Unresponsive, hypoxic, placed on BiPAP in ED 08/18> Admitted to ICU requiring BiPAP 08/18> CT Head- no acute findings  08/18> Echo completed, results pending  08/19> Taken off BiPAP, tolerating RA with SpO2 >90%  CULTURES: 08/18> COVID-19- negative 08/18> Blood culture- no growth in 24 hrs 08/18> Urine culture- multiple species present, suggested recollection- will not recollect at this time  08/18> MRSA- negative  ROS: Limited due to metabolic encephalopathy  Review of Systems  Respiratory: Negative for shortness of breath.   Cardiovascular: Negative for chest pain, palpitations and leg swelling.   Vitals:   07/01/19 0100 07/01/19  0200 07/01/19 0213 07/01/19 0800  BP: 91/70 101/85  135/82  Pulse: 91 91 83 94  Resp: 12 (!) 23 20 19   Temp:  98.3 F (36.8 C)  97.9 F (36.6 C)  TempSrc:  Oral  Oral  SpO2: 95% 95% 95% 97%  Weight:      Height:       SpO2: 97 % FiO2 (%): 24 %   Physical Exam  Constitutional: He appears well-developed and well-nourished. No distress.  Currently on BiPAP  HENT:  Head: Normocephalic.  Eyes: Pupils are equal, round, and reactive to light. No scleral icterus.  Neck: Neck supple.  Cardiovascular: Normal rate. An irregularly irregular rhythm present. Exam reveals distant heart sounds.  Respiratory: Effort normal. He has decreased breath sounds. He has rales (Basilar crackles).  GI: Soft. Normal appearance. He exhibits no distension. There is no hepatosplenomegaly. There is no abdominal tenderness. There is no guarding.  Musculoskeletal:        General: No edema.  Neurological: He is alert.  Oriented to self, able to follow simple commands  Skin: Skin is warm and dry. He is not diaphoretic.  Psychiatric: He has a normal mood and affect.   Labs CBC    Component Value Date/Time   WBC 9.1 07/01/2019 0538   RBC 4.86 07/01/2019 0538   HGB 14.4 07/01/2019 0538   HGB 11.6 (L) 12/02/2013 0545   HCT 46.1 07/01/2019 0538   HCT 35.2 (L) 12/02/2013 0545   PLT 190 07/01/2019 0538   PLT 173 12/02/2013 0545   MCV 94.9 07/01/2019 0538   MCV 88 12/02/2013 0545  MCH 29.6 07/01/2019 0538   MCHC 31.2 07/01/2019 0538   RDW 17.2 (H) 07/01/2019 0538   RDW 14.8 (H) 12/02/2013 0545   LYMPHSABS 1.0 06/30/2019 0906   LYMPHSABS 1.0 12/02/2013 0545   MONOABS 0.6 06/30/2019 0906   MONOABS 0.5 12/02/2013 0545   EOSABS 0.0 06/30/2019 0906   EOSABS 0.0 12/02/2013 0545   BASOSABS 0.1 06/30/2019 0906   BASOSABS 0.0 12/02/2013 0545   BMP Latest Ref Rng & Units 07/01/2019 06/30/2019 03/31/2019  Glucose 70 - 99 mg/dL 161(W116(H) 960(A182(H) 90  BUN 8 - 23 mg/dL 54(U27(H) 22 18  Creatinine 0.61 - 1.24 mg/dL 9.810.86  1.911.07 4.780.74  Sodium 135 - 145 mmol/L 142 139 137  Potassium 3.5 - 5.1 mmol/L 4.3 4.4 3.9  Chloride 98 - 111 mmol/L 94(L) 90(L) 94(L)  CO2 22 - 32 mmol/L 37(H) 36(H) 32  Calcium 8.9 - 10.3 mg/dL 9.3 8.9 2.9(F8.5(L)   TSH: 6.2131.419  MEDICATIONS: I have reviewed all medications and confirmed regimen as documented  IMAGING    Ct Head Wo Contrast  Result Date: 06/30/2019 CLINICAL DATA:  Altered mental status EXAM: CT HEAD WITHOUT CONTRAST TECHNIQUE: Contiguous axial images were obtained from the base of the skull through the vertex without intravenous contrast. COMPARISON:  None. FINDINGS: Brain: No evidence of acute infarction, hemorrhage, hydrocephalus, extra-axial collection or mass lesion/mass effect. Encephalomalacia in the right occipital lobe with ex vacuo dilatation of the posterior horn of the right lateral ventricle suggesting sequela of remote prior infarct. Scattered low-density changes within the periventricular and subcortical white matter compatible with chronic microvascular ischemic changes. Vascular: Atherosclerotic calcifications involving the large vessels of the skull base. No unexpected hyperdense vessel. Skull: Normal. Negative for fracture or focal lesion. Sinuses/Orbits: Mild mucosal thickening in the anteromedial aspect of the right maxillary sinus. Remaining visualized paranasal sinuses and mastoid air cells are clear. Orbital structures are intact and unremarkable. Other: None. IMPRESSION: 1. No acute intracranial findings. 2. 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 Electronically Signed   By: Duanne GuessNicholas  Plundo M.D.   On: 06/30/2019 13:17   Dg Chest Port 1 View  Result Date: 07/01/2019 CLINICAL DATA:  Respiratory failure EXAM: PORTABLE CHEST 1 VIEW COMPARISON:  06/30/2019 FINDINGS: There is mild bilateral interstitial thickening. There is no focal consolidation. There is no pleural effusion or pneumothorax. The heart and mediastinal  contours are unremarkable. There is no acute osseous abnormality. IMPRESSION: No acute cardiopulmonary disease. Electronically Signed   By: Elige KoHetal  Patel   On: 07/01/2019 06:31    ASSESSMENT AND PLAN SYNOPSIS  68 y.o male with improved acute on chronic hypoxic and hypercapnic respiratory failure no requiring BiPAP in the presence of metabolic encephalopathy.    Severe acute on chronic  Hypoxic and Hypercapnic Respiratory Failure -D/C BiPAP, continue PRN in day -BiPAP at bedtime  -concern for possible obstructive sleep apnea -Currently on RA with SpO2 >90%  -Supplemental oxygen PRN to maintain an SpO2 88-93% -Duoneb nebulizer treatment  -TSH normal    Metabolic Encephalopathy  -CT head- no acute findings.   -acetazolamide, 1x dose   CARDIAC FAILURE- EF unknown at this time, echo results pending  -Cardiology consulted  -Continue BiPAP PRN -D/C lasix > 08/18  -ECHO completed, results pending   Atrial Fibrillation - chronic, rate controlled at this time  ICU monitoring -Continue Eliquis   ID -follow up cultures   - no growth 8/19  GI  GI PROPHYLAXIS as indicated -Senna docusate  NUTRITIONAL STATUS DIET-->Fluids, thin  Constipation protocol as indicated  ENDO - will use ICU hypoglycemic\Hyperglycemia protocol if needed  ELECTROLYTES -follow labs as needed -replace as needed -pharmacy consultation and following  Physical therapy   DVT/GI PRX - on eliquis for A-fib  TRANSFUSIONS AS NEEDED MONITOR FSBS ASSESS the need for LABS   Anibal Henderson PA-S  PCCM ATTENDING ATTESTATION: I have evaluated patient with the PA student, reviewed database in its entirety and discussed care plan in detail. In addition, this patient was discussed on multidisciplinary rounds. I have personally reviewed all chest radiographs discussed herein including CXRs and CT chest unless otherwise indicated  He is much improved today.  Comfortable on room air.  No new complaints.   Important exam findings: NAD HEENT: NCAT, sclerae white, edentulous Neck: No JVD Cardiac: Irregularly irregular, no murmur noted, rate controlled Abdomen: Soft, NT, NABS Extremities: Chronic stasis changes, no edema Neuro: No focal deficits.  Cognition appears intact  CXR: Normal  TSH: Normal  Major problems addressed by PCCM team: Acute/chronic hypercarbic respiratory failure Obstructive sleep apnea Suspect obesity hypoventilation syndrome Metabolic alkalosis-mostly compensatory but probable mild primary component  This might be exacerbating his tendency for hypoventilation Chronic atrial fibrillation, rate controlled History of hypertension DNR/DNI   PLAN/REC: Change BiPAP to qHS only Careful titration of supplemental oxygen.  SPO2 goal 88-93% Continue nebulized bronchodilators as needed Acetazolamide x1 dose 8/19 Continue current cardiac regimen which has been fully reviewed by me today PT eval requested If comfortable as day goes on, will likely transfer to [address] floor   Merton Border, MD PCCM service Mobile [phone] Pager [phone] 07/01/2019 2:05 PM

## 2019-07-01 NOTE — Evaluation (Signed)
Physical Therapy Evaluation Patient Details Name: Dwayne Bridges MRN: [id number] DOB: [birth year] Today's Date: 07/01/2019   History of Present Illness  From MD H&P: Pt is a 68 yo male with past medical history of atrial fibrillation, diastolic CHF, acute on chronic respiratory failure with hypoxia, BPH, recent COVID +03/27/2019, hyperlipidemia and hypertension [facility].  MD assessment includes: Severe acute on chronic Hypoxic and Hypercapnic Respiratory Failure, Metabolic Encephalopathy, cardiac failure, and A-fib.    Clinical Impression  Pt presents with deficits in strength, transfers, mobility, gait, balance, and activity tolerance.  Pt required mod A with rolling left/right and +2 max A with sup to/from sit with heavy cues for sequencing and encouragement secondary to pt's fear of falling.  Once sitting on the EOB the pt required occasional min A to prevent R lateral LOB along with cues for hand placement for support.  Pt encouraged to attempt sit to stand with +2 assist but pt refused stating that he was too tired and afraid he would fall.  [facility] and per his POA had been able to walk short distances with a walker but mostly used a w/c for facility access.  Pt stated that he has not ambulated for some time but was unable to say how long it has been exactly.  Pt would benefit from a trial of HHPT at his LTC facility to attempt to restore functional strength required for transfers and limited amb for decreased caregiver assistance and decreased risk of further functional decline.       Follow Up Recommendations Home health PT    Equipment Recommendations  None recommended by PT    Recommendations for Other Services       Precautions / Restrictions Precautions Precautions: Fall Restrictions Weight Bearing Restrictions: No Other Position/Activity Restrictions: Keep O2 88-94%      Mobility  Bed Mobility Overal  bed mobility: Needs Assistance Bed Mobility: Supine to Sit;Sit to Supine;Rolling Rolling: Mod assist   Supine to sit: Max assist;+2 for physical assistance Sit to supine: Max assist;+2 for physical assistance   General bed mobility comments: Pt anxious with bed mobility assessment requiring encouragement to participate and reassurance that staff would keep him from falling.  Transfers                 General transfer comment: Pt unwilling to attempt to stand from the EOB secondary to fatigue and fear of falling  Ambulation/Gait                Stairs            Wheelchair Mobility    Modified Rankin (Stroke Patients Only)       Balance Overall balance assessment: Needs assistance   Sitting balance-Leahy Scale: Poor Sitting balance - Comments: Pt required frequent min A to maintain static sitting balance at the EOB with cues for hand placement for lateral support Postural control: Right lateral lean                                   Pertinent Vitals/Pain Pain Assessment: No/denies pain    Home Living Family/patient expects to be discharged to:: Skilled nursing facility                 Additional Comments: History and PLOF taken from combination of niece and patient; Pt is a LTC resident  at [facility]    Prior Function Level of Independence: Needs assistance   Gait / Transfers Assistance Needed: Pt's niece reports pt was able to amb very limited distances in the facility with a walker but primarily used a w/c for facility access; pt reports that he hasn't walked "for a long time" and staff has been using a lift for transfers to his w/c.  ADL's / Homemaking Assistance Needed: Staff assists with all ADLs        Hand Dominance        Extremity/Trunk Assessment   Upper Extremity Assessment Upper Extremity Assessment: Generalized weakness;LUE deficits/detail LUE Deficits / Details: Pt with decreased fine motor control of  the LUE with niece reporting that pt has a history of decreased L hand/LUE use for years.  Primarily uses BLEs and RUE for w/c propulsion.  Pt was able to grip the walker and bed rail with the L hand during assessment. LUE Coordination: decreased fine motor    Lower Extremity Assessment Lower Extremity Assessment: Generalized weakness       Communication   Communication: No difficulties  Cognition Arousal/Alertness: Awake/alert Behavior During Therapy: Anxious Overall Cognitive Status: History of cognitive impairments - at baseline                                        General Comments      Exercises Total Joint Exercises Ankle Circles/Pumps: Strengthening;Both;10 reps Towel Squeeze: Strengthening;Both;10 reps Short Arc Quad: Strengthening;Both;10 reps Heel Slides: AROM;AAROM;Both;10 reps Hip ABduction/ADduction: AROM;AAROM;Both;10 reps Straight Leg Raises: AROM;AAROM;Both;10 reps Long Arc Quad: AROM;Both;10 reps Knee Flexion: AROM;Both;10 reps Other Exercises Other Exercises: Rolling left/right with mod A and verbal cues for sequencing  Static sitting at the EOB x 5 min   Assessment/Plan    PT Assessment Patient needs continued PT services  PT Problem List Decreased strength;Decreased activity tolerance;Decreased balance;Decreased mobility       PT Treatment Interventions Gait training;DME instruction;Functional mobility training;Therapeutic activities;Therapeutic exercise;Balance training;Patient/family education    PT Goals (Current goals can be found in the Care Plan section)  Acute Rehab PT Goals Patient Stated Goal: To get stronger PT Goal Formulation: With patient Time For Goal Achievement: 07/14/19 Potential to Achieve Goals: Fair    Frequency Min 2X/week   Barriers to discharge        Co-evaluation               AM-PAC PT "6 Clicks" Mobility  Outcome Measure Help needed turning from your back to your side while in a flat bed  without using bedrails?: Total Help needed moving from lying on your back to sitting on the side of a flat bed without using bedrails?: Total Help needed moving to and from a bed to a chair (including a wheelchair)?: Total Help needed standing up from a chair using your arms (e.g., wheelchair or bedside chair)?: Total Help needed to walk in hospital room?: Total Help needed climbing 3-5 steps with a railing? : Total 6 Click Score: 6    End of Session Equipment Utilized During Treatment: Gait belt Activity Tolerance: Patient tolerated treatment well Patient left: in bed;with call bell/phone within reach;with nursing/sitter in room Nurse Communication: Mobility status PT Visit Diagnosis: Difficulty in walking, not elsewhere classified (R26.2);Muscle weakness (generalized) (M62.81)    Time: 1435(and 15:20-15:38)-1448 PT Time Calculation (min) (ACUTE ONLY): 13 min   Charges:   PT Evaluation $  PT Eval Moderate Complexity: 1 Mod PT Treatments $Therapeutic Exercise: 8-22 mins        D. Elly ModenaScott Harshan Kearley PT, DPT 07/01/19, 4:21 PM

## 2019-07-01 NOTE — Progress Notes (Signed)
Pt being transferred to room 253. Report called to Janett Billow, Therapist, sports. Pt transported to room 253 without incident.

## 2019-07-01 NOTE — Progress Notes (Signed)
[place] [facility] [place] NAME: Dwayne Bridges    MR#:  [id number]  DATE OF BIRTH:  February 16, 1951  SUBJECTIVE:   Patient came in from nursing home with increasing shortness of breath was found to have co2 of112 placed on BiPAP and in the ICU. Currently off BiPAP sats are 91 to 92% on room air. Patient appears to be at baseline and he is been eating and drinking well. REVIEW OF SYSTEMS:   Review of Systems  Constitutional: Negative for chills, fever and weight loss.  HENT: Negative for ear discharge, ear pain and nosebleeds.   Eyes: Negative for blurred vision, pain and discharge.  Respiratory: Positive for shortness of breath. Negative for sputum production, wheezing and stridor.   Cardiovascular: Negative for chest pain, palpitations, orthopnea and PND.  Gastrointestinal: Negative for abdominal pain, diarrhea, nausea and vomiting.  Genitourinary: Negative for frequency and urgency.  Musculoskeletal: Negative for back pain and joint pain.  Neurological: Positive for weakness. Negative for sensory change, speech change and focal weakness.  Psychiatric/Behavioral: Negative for depression and hallucinations. The patient is not nervous/anxious.    Tolerating Diet:yes Tolerating PT: pending  DRUG ALLERGIES:  No Known Allergies  VITALS:  Blood pressure (!) 127/41, pulse 91, temperature 98.1 F (36.7 C), temperature source Oral, resp. rate 19, height 5\' 10"  (1.778 m), weight 101.9 kg, SpO2 93 %.  PHYSICAL EXAMINATION:   Physical Exam  GENERAL:  68 y.o.-year-old patient lying in the bed with no acute distress. Obese  EYES: Pupils equal, round, reactive to light and accommodation. No scleral icterus. Extraocular muscles intact.  HEENT: Head atraumatic, normocephalic. Oropharynx and nasopharynx clear.  NECK:  Supple, no jugular venous distention. No thyroid enlargement, no tenderness.  LUNGS: distant breath sounds bilaterally, no wheezing, rales,  rhonchi. No use of accessory muscles of respiration.  CARDIOVASCULAR: S1, S2 normal. No murmurs, rubs, or gallops.  ABDOMEN: Soft, nontender, nondistended. Bowel sounds present. No organomegaly or mass.  EXTREMITIES + edema b/l.    NEUROLOGIC: Cranial nerves II through XII are intact. No focal Motor or sensory deficits b/l.   PSYCHIATRIC:  patient is alert and oriented x 2.  SKIN: No obvious rash, lesion, or ulcer.   LABORATORY PANEL:  CBC Recent Labs  Lab 07/01/19 0538  WBC 9.1  HGB 14.4  HCT 46.1  PLT 190    Chemistries  Recent Labs  Lab 06/30/19 0906 07/01/19 0538  NA 139 142  K 4.4 4.3  CL 90* 94*  CO2 36* 37*  GLUCOSE 182* 116*  BUN 22 27*  CREATININE 1.07 0.86  CALCIUM 8.9 9.3  AST 24  --   ALT 17  --   ALKPHOS 104  --   BILITOT 0.7  --    Cardiac Enzymes No results for input(s): TROPONINI in the last 168 hours. RADIOLOGY:  Ct Head Wo Contrast  Result Date: 06/30/2019 CLINICAL DATA:  Altered mental status EXAM: CT HEAD WITHOUT CONTRAST TECHNIQUE: Contiguous axial images were obtained from the base of the skull through the vertex without intravenous contrast. COMPARISON:  None. FINDINGS: Brain: No evidence of acute infarction, hemorrhage, hydrocephalus, extra-axial collection or mass lesion/mass effect. Encephalomalacia in the right occipital lobe with ex vacuo dilatation of the posterior horn of the right lateral ventricle suggesting sequela of remote prior infarct. Scattered low-density changes within the periventricular and subcortical white matter compatible with chronic microvascular ischemic changes. Vascular: Atherosclerotic calcifications involving the large vessels of the skull base. No unexpected  hyperdense vessel. Skull: Normal. Negative for fracture or focal lesion. Sinuses/Orbits: Mild mucosal thickening in the anteromedial aspect of the right maxillary sinus. Remaining visualized paranasal sinuses and mastoid air cells are clear. Orbital structures are  intact and unremarkable. Other: None. IMPRESSION: 1. No acute intracranial findings. 2. 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 Electronically Signed   By: Duanne GuessNicholas  Plundo M.D.   On: 06/30/2019 13:17   Dg Chest Port 1 View  Result Date: 07/01/2019 CLINICAL DATA:  Respiratory failure EXAM: PORTABLE CHEST 1 VIEW COMPARISON:  06/30/2019 FINDINGS: There is mild bilateral interstitial thickening. There is no focal consolidation. There is no pleural effusion or pneumothorax. The heart and mediastinal contours are unremarkable. There is no acute osseous abnormality. IMPRESSION: No acute cardiopulmonary disease. Electronically Signed   By: Elige KoHetal  Kenneith Stief   On: 07/01/2019 06:31   Dg Chest Port 1 View  Result Date: 06/30/2019 CLINICAL DATA:  Shortness of breath EXAM: PORTABLE CHEST 1 VIEW COMPARISON:  03/30/2019 FINDINGS: Cardiomegaly. Low lung volumes with bibasilar atelectasis. No overt edema or effusions. No acute bony abnormality. IMPRESSION: Cardiomegaly, low volumes with bibasilar atelectasis. Electronically Signed   By: Charlett NoseKevin  Dover M.D.   On: 06/30/2019 09:16   ASSESSMENT AND PLAN:  68 y.o. male with past medical history of atrial fibrillation on Eliquis, diastolic CHF, acute on chronic respiratory failure with hypoxia, BPH, recent COVID +03/27/2019, hyperlipidemia and hypertension [facility].  1.  Acute on chronic respiratory failure with hypoxia -likely multifactorial in a patient with history of recent COVID-19 infection (May 2020), CHF, morbid obesity and underlying untreated OSA -* No evidence of infectious process - Continue BiPAP--now on [place] oxygen--doing well -pt's CO2 was 112--72 - COVID-19 negative  2. Acute on chronicDiastolicCongestive Heart Failure:  BNPmildlyelevated at 168 - Chest x-ray shows low volumes with bibasilar atelectasis Last Echo5/10/2008 , EF 65% - Continue lisinopril -  Diuretics: Furosemide40mg  IVBID. Diureses >1L negative per day until approach euvolemia / worsening renal function. - Low salt diet - Check daily weight - Strict I&Os  3. Altered mental status -likely due to hypoxemia--now at baseline -On BiPAP as above  4. Elevated troponin -likely due to demand ischemia -Trend troponin  5. Paroxysmal atrial fibrillation -continue Eliquis for anticoagulation -Rate controlled hold diltiazem  6. HTN  + Goal BP <130/80 - Continue lisinopril  7. HLD  + Goal LDL<100 - Atorvastatin 20mg  PO qhs  8. DVT prophylaxis - Therapeutically anti-coagulated with Eliquis   Case discussed with Care Management/Social Worker. Management plans discussed with the patient and they are in agreement.  CODE STATUS: DNR  DVT Prophylaxis: eliquis  TOTAL TIME TAKING CARE OF THIS PATIENT: *30* minutes.  >50% time spent on counselling and coordination of care  POSSIBLE D/C IN 1-2 DAYS, DEPENDING ON CLINICAL CONDITION.  Note: This dictation was prepared with Dragon dictation along with smaller phrase technology. Any transcriptional errors that result from this process are unintentional.  Enedina FinnerSona Aaran Enberg M.D on 07/01/2019 at 3:59 PM  Between 7am to 6pm - Pager - [phone]  After 6pm go to [email or web address] - password [email or web address]  [facility] [place] [place] [facility]  Office  [phone]  CC: Primary care physician; Patient, [name] PerPatient ID: Dwayne NeedsJohn W Bridges, male   DOB: 15-Jan-1951, 68 y.o.   MRN: [id number]

## 2019-07-02 LAB — BASIC METABOLIC PANEL
Anion gap: 9 (ref 5–15)
BUN: 38 mg/dL — ABNORMAL HIGH (ref 8–23)
CO2: 32 mmol/L (ref 22–32)
Calcium: 8.8 mg/dL — ABNORMAL LOW (ref 8.9–10.3)
Chloride: 96 mmol/L — ABNORMAL LOW (ref 98–111)
Creatinine, Ser: 0.86 mg/dL (ref 0.61–1.24)
GFR calc Af Amer: >60 mL/min (ref >60)
GFR calc non Af Amer: >60 mL/min (ref >60)
Glucose, Bld: 110 mg/dL — ABNORMAL HIGH (ref 70–99)
Potassium: 3 mmol/L — ABNORMAL LOW (ref 3.5–5.1)
Sodium: 137 mmol/L (ref 135–145)

## 2019-07-02 LAB — CULTURE, BLOOD (ROUTINE X 2)

## 2019-07-02 MED ORDER — POTASSIUM CHLORIDE CRYS ER 20 MEQ PO TBCR
40.0000 meq | EXTENDED_RELEASE_TABLET | ORAL | Status: AC
  Administered 2019-07-02 (×2): 40 meq via ORAL
  Filled 2019-07-02 (×2): qty 2

## 2019-07-02 MED ORDER — FUROSEMIDE 40 MG PO TABS
40.0000 mg | ORAL_TABLET | Freq: Two times a day (BID) | ORAL | Status: DC
  Administered 2019-07-02 – 2019-07-03 (×2): 40 mg via ORAL
  Filled 2019-07-02 (×2): qty 1

## 2019-07-02 NOTE — Progress Notes (Signed)
Informed Dr. Posey Pronto of patient's low B.P. a while back (systolic = only about 86). No new orders. Will continue to monitor. Wenda Low [facility]

## 2019-07-02 NOTE — Progress Notes (Signed)
[facility] -  at [facility]   PATIENT NAME: Dwayne Bridges    MR#:  [id number]  DATE OF BIRTH:  1950-11-28  SUBJECTIVE:   Patient came in from nursing home with increasing shortness of breath was found to have co2 of112 placed on BiPAP a   Patient appears to be at baseline and he is been eating and drinking well. REVIEW OF SYSTEMS:   Review of Systems  Constitutional: Negative for chills, fever and weight loss.  HENT: Negative for ear discharge, ear pain and nosebleeds.   Eyes: Negative for blurred vision, pain and discharge.  Respiratory: Positive for shortness of breath. Negative for sputum production, wheezing and stridor.   Cardiovascular: Negative for chest pain, palpitations, orthopnea and PND.  Gastrointestinal: Negative for abdominal pain, diarrhea, nausea and vomiting.  Genitourinary: Negative for frequency and urgency.  Musculoskeletal: Negative for back pain and joint pain.  Neurological: Positive for weakness. Negative for sensory change, speech change and focal weakness.  Psychiatric/Behavioral: Negative for depression and hallucinations. The patient is not nervous/anxious.    Tolerating Diet:yes Tolerating PT: HHPT  DRUG ALLERGIES:  No Known Allergies  VITALS:  Blood pressure 97/75, pulse 67, temperature 97.7 F (36.5 C), resp. rate 18, height 5\' 10"  (1.778 m), weight 103.2 kg, SpO2 94 %.  PHYSICAL EXAMINATION:   Physical Exam  GENERAL:  68 y.o.-year-old patient lying in the bed with no acute distress. Obese  EYES: Pupils equal, round, reactive to light and accommodation. No scleral icterus. Extraocular muscles intact.  HEENT: Head atraumatic, normocephalic. Oropharynx and nasopharynx clear.  NECK:  Supple, no jugular venous distention. No thyroid enlargement, no tenderness.  LUNGS: distant breath sounds bilaterally, no wheezing, rales, rhonchi. No use of accessory muscles of respiration.  CARDIOVASCULAR: S1, S2 normal. No  murmurs, rubs, or gallops.  ABDOMEN: Soft, nontender, nondistended. Bowel sounds present. No organomegaly or mass.  EXTREMITIES + edema b/l.    NEUROLOGIC: Cranial nerves II through XII are intact. No focal Motor or sensory deficits b/l.   PSYCHIATRIC:  patient is alert and oriented x 2.  SKIN: No obvious rash, lesion, or ulcer.   LABORATORY PANEL:  CBC Recent Labs  Lab 07/01/19 0538  WBC 9.1  HGB 14.4  HCT 46.1  PLT 190    Chemistries  Recent Labs  Lab 06/30/19 0906  07/02/19 0459  NA 139   < > 137  K 4.4   < > 3.0*  CL 90*   < > 96*  CO2 36*   < > 32  GLUCOSE 182*   < > 110*  BUN 22   < > 38*  CREATININE 1.07   < > 0.86  CALCIUM 8.9   < > 8.8*  AST 24  --   --   ALT 17  --   --   ALKPHOS 104  --   --   BILITOT 0.7  --   --    < > = values in this interval not displayed.   Cardiac Enzymes No results for input(s): TROPONINI in the last 168 hours. RADIOLOGY:  Dg Chest Port 1 View  Result Date: 07/01/2019 CLINICAL DATA:  Respiratory failure EXAM: PORTABLE CHEST 1 VIEW COMPARISON:  06/30/2019 FINDINGS: There is mild bilateral interstitial thickening. There is no focal consolidation. There is no pleural effusion or pneumothorax. The heart and mediastinal contours are unremarkable. There is no acute osseous abnormality. IMPRESSION: No acute cardiopulmonary disease. Electronically Signed   By: Elige KoHetal  Quade Ramirez  On: 07/01/2019 06:31   ASSESSMENT AND PLAN:  68 y.o. male with past medical history of atrial fibrillation on Eliquis, diastolic CHF, acute on chronic respiratory failure with hypoxia, BPH, recent COVID +03/27/2019, hyperlipidemia and hypertension presenting from [facility] with unresponsiveness.  1.  Acute on chronic respiratory failure with hypoxia -likely multifactorial in a patient with history of recent COVID-19 infection (May 2020), CHF, morbid obesity and underlying untreated OSA -* No evidence of infectious process - Continue BiPAP--now on [place] oxygen--doing  well -pt's CO2 was 112--72 - COVID-19 negative  2. Acute on chronicDiastolicCongestive Heart Failure:  BNPmildlyelevated at 168 - Chest x-ray shows low volumes with bibasilar atelectasis Last Echo5/10/2008 , EF 65% - Continue lisinopril - Diuretics: Furosemide40mg  IVBID--change to home dose lasix - Low salt diet - Check daily weight - Strict I&Os  3. Altered mental status -likely due to hypoxemia--now at baseline  4. Elevated troponin -likely due to demand ischemia -Trend troponin  5. Paroxysmal atrial fibrillation -continue Eliquis for anticoagulation -Rate controlled hold diltiazem  6. HTN  + Goal BP <130/80 - Continue lisinopril  7. HLD  + Goal LDL<100 - Atorvastatin 20mg  PO qhs  8. DVT prophylaxis - Therapeutically anti-coagulated with Eliquis  Anticipated discharge back to his long-term facility tomorrow if continues to show improvement. Social worker aware.   Case discussed with Care Management/Social Worker.   CODE STATUS: DNR  DVT Prophylaxis: eliquis  TOTAL TIME TAKING CARE OF THIS PATIENT: *30* minutes.  >50% time spent on counselling and coordination of care  POSSIBLE D/C IN 1-2 DAYS, DEPENDING ON CLINICAL CONDITION.  Note: This dictation was prepared with Dragon dictation along with smaller phrase technology. Any transcriptional errors that result from this process are unintentional.  Fritzi Mandes M.D on 07/02/2019 at 3:22 PM  Between 7am to 6pm - Pager - [phone]  After 6pm go to [email or web address] - password [email or web address]  Office  [phone]  CC: Primary care physician; Patient, [name], male   DOB: 10/22/1951, 68 y.o.   MRN: [id number]

## 2019-07-03 LAB — CBC
HCT: 45.5 % (ref 39.0–52.0)
Hemoglobin: 14.1 g/dL (ref 13.0–17.0)
MCH: 29.9 pg (ref 26.0–34.0)
MCHC: 31 g/dL (ref 30.0–36.0)
MCV: 96.4 fL (ref 80.0–100.0)
Platelets: 182 10*3/uL (ref 150–400)
RBC: 4.72 MIL/uL (ref 4.22–5.81)
RDW: 16.7 % — ABNORMAL HIGH (ref 11.5–15.5)
WBC: 8.8 10*3/uL (ref 4.0–10.5)
nRBC: 0 % (ref 0.0–0.2)

## 2019-07-03 LAB — POTASSIUM: Potassium: 4.1 mmol/L (ref 3.5–5.1)

## 2019-07-03 MED ORDER — IPRATROPIUM-ALBUTEROL 0.5-2.5 (3) MG/3ML IN SOLN: 3.0000 mL | Freq: Four times a day (QID) | RESPIRATORY_TRACT | 1 refills | Status: AC | PRN

## 2019-07-03 MED ORDER — DILTIAZEM HCL 60 MG PO TABS: 60.0000 mg | ORAL_TABLET | Freq: Two times a day (BID) | ORAL | 1 refills | Status: DC | PRN

## 2019-07-03 NOTE — Progress Notes (Signed)
Central tele monitoring has reported to this RN that patient has intermittent drops in HR; most recent drop to the 30's. Pt also had greater than 3 sec pause. Current HR 68-75. Diltiazem 180 mg held; Dr. Tressia Miners notified and plans to decrease dose.   Pt is also for DC today back to nursing facility.

## 2019-07-03 NOTE — TOC Initial Note (Addendum)
Transition of Care [facility](TOC) - Initial/Assessment Note    Patient Details  Name: Dwayne Bridges MRN: [id number] Date of Birth: 01-11-1951  Transition of Care [facility](TOC) CM/SW Contact:    Darleene CleaverAnterhaus, Damontae Loppnow R, [place] Phone Number: 07/03/2019 1:45pm  Clinical Narrative:                 [facility].  [facility], and they can accept patient once he is medically ready for discharge and orders have been received.  Expected Discharge Plan: Skilled Nursing Facility Barriers to Discharge: Barriers Resolved   Patient Goals and CMS Choice Patient states their goals for this hospitalization and ongoing recovery are:: To return to SNF CMS Medicare.gov Compare Post Acute Care list provided to:: Patient Represenative (must comment) Choice offered to / list presented to : [facility] / Guardian, Patient  Expected Discharge Plan and Services Expected Discharge Plan: Skilled Nursing Facility In-house Referral: Clinical Social Work   Post Acute Care Choice: Skilled Nursing Facility Living arrangements for the past 2 months: Skilled Nursing Facility Expected Discharge Date: 07/03/19               DME Arranged: N/A DME Agency: NA     Representative spoke with at DME Agency: na   HH Agency: NA     [facility] Agency: na  Prior Living Arrangements/Services Living arrangements for the past 2 months: Skilled Nursing Facility Lives with:: Facility Resident Patient language and need for interpreter reviewed:: Yes Do you feel safe going back to the place where you live?: Yes      Need for Family Participation in Patient Care: Yes (Comment) Care giver support system in place?: Yes (comment)   Criminal Activity/Legal Involvement Pertinent to Current Situation/Hospitalization: No - Comment as needed  Activities of Daily Living Home Assistive Devices/Equipment: Other (Comment)(unknown) ADL Screening (condition at time of  admission) Patient's cognitive ability adequate to safely complete daily activities?: No Is the patient deaf or have difficulty hearing?: No Does the patient have difficulty seeing, even when wearing glasses/contacts?: No Does the patient have difficulty concentrating, remembering, or making decisions?: Yes Patient able to express need for assistance with ADLs?: No Does the patient have difficulty dressing or bathing?: Yes Independently performs ADLs?: No(unknown) Does the patient have difficulty walking or climbing stairs?: Yes Weakness of Legs: None Weakness of Arms/Hands: None  Permission Sought/Granted Permission sought to share information with : Facility Medical sales representativeContact Representative, Family Supports Permission granted to share information with : Yes, Verbal Permission Granted  Share Information with NAMKaylyn Layer: YEAMAN,MARY Niece [phone]  Permission granted to share info w AGENCY: SNF admissions        Emotional Assessment Appearance:: Appears stated age Attitude/Demeanor/Rapport: Engaged Affect (typically observed): Calm, Accepting, Appropriate, Stable Orientation: : Oriented to Self, Oriented to Place, Oriented to Situation Alcohol / Substance Use: Not Applicable Psych Involvement: No (comment)  Admission diagnosis:  SOB (shortness of breath) [R06.02] Patient Active Problem List   Diagnosis Date Noted  . Acute respiratory failure with hypoxia (HCC) 06/30/2019  . Atrial fibrillation with RVR (HCC) 03/28/2019  . Acute on chronic diastolic CHF (congestive heart failure) (HCC) 03/27/2019  . Acute on chronic respiratory failure with hypoxia (HCC) 03/27/2019  . COVID-19 virus infection 03/27/2019  . HTN (hypertension) 03/27/2019  . BPH (benign prostatic hyperplasia) 03/27/2019  . HLD (hyperlipidemia) 03/27/2019  . CHF exacerbation (HCC) 03/27/2019   PCP:  Patient, [name]  Per Pharmacy:  No Pharmacies Listed    Social Determinants of Health (SDOH)  Interventions    Readmission Risk Interventions No flowsheet data found.

## 2019-07-03 NOTE — Discharge Summary (Addendum)
[place] [facility] [place] NAME: Dwayne Bridges    MR#:  [id number]  DATE OF BIRTH:  06-26-1951  DATE OF ADMISSION:  06/30/2019   ADMITTING PHYSICIAN: Lang Snow, NP  DATE OF DISCHARGE:  07/03/19  PRIMARY CARE PHYSICIAN: Patient, [name] Per   ADMISSION DIAGNOSIS:   SOB (shortness of breath) [R06.02]  DISCHARGE DIAGNOSIS:   Active Problems:   Acute respiratory failure with hypoxia ([place])   SECONDARY DIAGNOSIS:   Past Medical History:  Diagnosis Date   CHF (congestive heart failure) ([place])    Hypertension     HOSPITAL COURSE:   68 year old male who is a long-term resident at [facility], past medical history significant for permanent A. fib on Eliquis, chronic diastolic CHF, likely undiagnosed sleep apnea was brought to the hospital secondary to change in mental status and hypoxia.  1.  Acute hypoxic hypercarbic respiratory failure-patient has recently recovered from [place] infection back in May 2020. -Also secondary to diastolic CHF exacerbation with underlying sleep apnea causing hypercapnia on presentation. -Patient got admitted to stepdown and required BiPAP continuously.  Now much improved on BiPAP at bedtime. -Patient will benefit from having a BiPAP at bedtime to prevent hypercarbic respiratory failure.  2.  Acute on chronic diastolic heart failure-echocardiogram showing EF of greater than 41% with diastolic dysfunction -Improved with IV diuresis.  Change Lasix to oral twice daily at this time with potassium supplements. -Patient not on lisinopril at home, since his ejection fraction is fine, will not continue lisinopril given his low blood pressures. -CHF clinic follow-up, daily weights, low-salt diet and strict input and output monitoring -Appreciate cardiology input  3.  Persistent A. fib-continue Eliquis for anticoagulation -On Cardizem for rate control- short acting bid prn due to low HR here and low BP  4.   Hyperlipidemia-on statin  5.  Altered mental status on admission-secondary to acute metabolic encephalopathy which is much improved at this time and back to baseline.  Patient will be discharged back to his long-term facility today  DISCHARGE CONDITIONS:   Guarded  CONSULTS OBTAINED:   Treatment Team:  Yolonda Kida, MD  DRUG ALLERGIES:   No Known Allergies DISCHARGE MEDICATIONS:   Allergies as of 07/03/2019   No Known Allergies     Medication List    STOP taking these medications   diltiazem 180 MG 24 hr capsule Commonly known as: Cardizem CD   lisinopril 5 MG tablet Commonly known as: ZESTRIL     TAKE these medications   acetaminophen 325 MG tablet Commonly known as: TYLENOL Take 650 mg by mouth every 6 (six) hours as needed (leg pain).   apixaban 5 MG Tabs tablet Commonly known as: ELIQUIS Take 1 tablet (5 mg total) by mouth 2 (two) times daily. Notes to patient: Next dose due today (07/03/2019) in the evening   atorvastatin 20 MG tablet Commonly known as: LIPITOR Take 20 mg by mouth at bedtime. Notes to patient: Next dose due today, 07/03/2019 at bedtime   diltiazem 60 MG tablet Commonly known as: Cardizem Take 1 tablet (60 mg total) by mouth 2 (two) times daily as needed (for HR >110). Notes to patient: Next dose due 07/03/2019 at _________ .   divalproex 125 MG capsule Commonly known as: DEPAKOTE SPRINKLE Take 375 mg by mouth 2 (two) times daily.   finasteride 5 MG tablet Commonly known as: PROSCAR Take 5 mg by mouth daily.   furosemide 40 MG tablet Commonly  known as: LASIX Take 1 tablet (40 mg total) by mouth 2 (two) times daily.   ipratropium-albuterol 0.5-2.5 (3) MG/3ML Soln Commonly known as: DUONEB Take 3 mLs by nebulization every 6 (six) hours as needed (wheezing, shortness of breath).   multivitamin tablet Take 1 tablet by mouth daily.   potassium chloride SA 20 MEQ tablet Commonly known as: K-DUR Take 1 tablet (20 mEq total)  by mouth daily.   sennosides-docusate sodium 8.6-50 MG tablet Commonly known as: SENOKOT-S Take 1 tablet by mouth 2 (two) times daily.   terazosin 10 MG capsule Commonly known as: HYTRIN Take 10 mg by mouth at bedtime.   traZODone 50 MG tablet Commonly known as: DESYREL Take 50 mg by mouth at bedtime.        DISCHARGE INSTRUCTIONS:   1. PCP f/u in 1-2 weeks 2. Cardiology f/u in 3 weeks 3. Bipap at bedtime  DIET:   Cardiac diet  ACTIVITY:   Activity as tolerated  OXYGEN:   Home Oxygen: No.  Oxygen Delivery: room air  DISCHARGE LOCATION:   nursing home   If you experience worsening of your admission symptoms, develop shortness of breath, life threatening emergency, suicidal or homicidal thoughts you must seek medical attention immediately by calling 911 or calling your MD immediately  if symptoms less severe.  You Must read complete instructions/literature along with all the possible adverse reactions/side effects for all the Medicines you take and that have been prescribed to you. Take any new Medicines after you have completely understood and accpet all the possible adverse reactions/side effects.   Please note  You were cared for by a hospitalist during your hospital stay. If you have any questions about your discharge medications or the care you received while you were in the hospital after you are discharged, you can call the unit and asked to speak with the hospitalist on call if the hospitalist that took care of you is not available. Once you are discharged, your primary care physician will handle any further medical issues. Please note that NO REFILLS for any discharge medications will be authorized once you are discharged, as it is imperative that you return to your primary care physician (or establish a relationship with a primary care physician if you do not have one) for your aftercare needs so that they can reassess your need for medications and monitor your  lab values.    On the day of Discharge:  VITAL SIGNS:   Blood pressure 103/67, pulse 67, temperature 98.2 F (36.8 C), resp. rate 19, height 5\' 10"  (1.778 m), weight 103.2 kg, SpO2 97 %.  PHYSICAL EXAMINATION:    GENERAL:  68 y.o.-year-old obese patient lying in the bed with no acute distress.  EYES: Pupils equal, round, reactive to light and accommodation. No scleral icterus. Extraocular muscles intact.  HEENT: Head atraumatic, normocephalic. Oropharynx and nasopharynx clear.  NECK:  Supple, no jugular venous distention. No thyroid enlargement, no tenderness.  LUNGS: Normal breath sounds bilaterally, no wheezing, rales,rhonchi or crepitation. No use of accessory muscles of respiration. Decreased bibasilar breath sounds CARDIOVASCULAR: S1, S2 normal. No murmurs, rubs, or gallops.  ABDOMEN: Soft, non-tender, non-distended. Bowel sounds present. No organomegaly or mass.  EXTREMITIES: No pedal edema, cyanosis, or clubbing.  NEUROLOGIC: Cranial nerves II through XII are intact. Muscle strength 5/5 in all extremities. Sensation intact. Gait not checked. Global weakness PSYCHIATRIC: The patient is alert and oriented x 3. Cognitive deficits noted. SKIN: No obvious rash, lesion, or ulcer.  DATA REVIEW:   CBC Recent Labs  Lab 07/03/19 0608  WBC 8.8  HGB 14.1  HCT 45.5  PLT 182    Chemistries  Recent Labs  Lab 06/30/19 0906  07/02/19 0459 07/03/19 0608  NA 139   < > 137  --   K 4.4   < > 3.0* 4.1  CL 90*   < > 96*  --   CO2 36*   < > 32  --   GLUCOSE 182*   < > 110*  --   BUN 22   < > 38*  --   CREATININE 1.07   < > 0.86  --   CALCIUM 8.9   < > 8.8*  --   AST 24  --   --   --   ALT 17  --   --   --   ALKPHOS 104  --   --   --   BILITOT 0.7  --   --   --    < > = values in this interval not displayed.     Microbiology Results  Results for orders placed or performed during the hospital encounter of 06/30/19  Urine culture     Status: Abnormal   Collection Time:  06/30/19  9:02 AM   Specimen: Urine, Random  Result Value Ref Range Status   Specimen Description   Final    URINE, RANDOM Performed at [facility], [address], [place], [place] [address]    Special Requests   Final    NONE Performed at [facility], [address] [place], [place] [address]    Culture MULTIPLE SPECIES PRESENT, SUGGEST RECOLLECTION (A)  Final   Report Status 07/01/2019 FINAL  Final  Culture, blood (routine x 2)     Status: Abnormal   Collection Time: 06/30/19  9:06 AM   Specimen: BLOOD  Result Value Ref Range Status   Specimen Description   Final    BLOOD RIGHT ANTECUBITAL Performed at [facility], [address] [place], [place] [address]    Special Requests   Final    BOTTLES DRAWN AEROBIC AND ANAEROBIC Blood Culture results may not be optimal due to an excessive volume of blood received in culture bottles Performed at [facility], [address] [place], [place] [address]    Culture  Setup Time   Final    GRAM POSITIVE COCCI AEROBIC BOTTLE ONLY CRITICAL RESULT CALLED TO, READ BACK BY AND VERIFIED WITH: KRISTIN MERRILL @1151  ON 07/01/2019 BY QSD/FMW    Culture (A)  Final    STAPHYLOCOCCUS SPECIES (COAGULASE NEGATIVE) THE SIGNIFICANCE OF ISOLATING THIS ORGANISM FROM A SINGLE SET OF BLOOD CULTURES WHEN MULTIPLE SETS ARE DRAWN IS UNCERTAIN. PLEASE NOTIFY THE MICROBIOLOGY DEPARTMENT WITHIN ONE WEEK IF SPECIATION AND SENSITIVITIES ARE REQUIRED. Performed at [facility], [address] [place], [place] [address]    Report Status 07/02/2019 FINAL  Final  Culture, blood (routine x 2)     Status: None (Preliminary result)   Collection Time: 06/30/19  9:06 AM   Specimen: BLOOD  Result Value Ref Range Status   Specimen Description BLOOD LEFT [facility]  Final   Special Requests   Final    BOTTLES DRAWN AEROBIC AND ANAEROBIC Blood Culture adequate volume   Culture   Final    NO GROWTH 3 DAYS Performed at [facility], [address] [place], [place] [address]    Report Status PENDING  Incomplete  SARS Coronavirus 2 [facility] order, Performed in [facility] hospital lab) Nasopharyngeal Nasopharyngeal Swab     Status: None   Collection Time: 06/30/19  9:06 AM   Specimen: Nasopharyngeal Swab  Result Value Ref Range Status   SARS Coronavirus 2 NEGATIVE NEGATIVE Final    Comment: (NOTE) If result is NEGATIVE SARS-CoV-2 target nucleic acids are NOT DETECTED. The SARS-CoV-2 RNA is generally detectable in upper and lower  respiratory specimens during the acute phase of infection. The lowest  concentration of SARS-CoV-2 viral copies this assay can detect is 250  copies / mL. A negative result does not preclude SARS-CoV-2 infection  and should not be used as the sole basis for treatment or other  patient management decisions.  A negative result may occur with  improper specimen collection / handling, submission of specimen other  than nasopharyngeal swab, presence of viral mutation(s) within the  areas targeted by this assay, and inadequate number of viral copies  (<250 copies / mL). A negative result must be combined with clinical  observations, patient history, and epidemiological information. If result is POSITIVE SARS-CoV-2 target nucleic acids are DETECTED. The SARS-CoV-2 RNA is generally detectable in upper and lower  respiratory specimens dur ing the acute phase of infection.  Positive  results are indicative of active infection with SARS-CoV-2.  Clinical  correlation with patient history and other diagnostic information is  necessary to determine patient infection status.  Positive results do  not rule out bacterial infection or co-infection with other viruses. If result is PRESUMPTIVE POSTIVE SARS-CoV-2 nucleic acids MAY BE PRESENT.   A presumptive positive result was obtained on the submitted specimen  and confirmed on repeat testing.  While 2019 novel coronavirus  (SARS-CoV-2)  nucleic acids may be present in the submitted sample  additional confirmatory testing may be necessary for epidemiological  and / or clinical management purposes  to differentiate between  SARS-CoV-2 and other Sarbecovirus currently known to infect humans.  If clinically indicated additional testing with an alternate test  methodology [phone]) is advised. The SARS-CoV-2 RNA is generally  detectable in upper and lower respiratory sp ecimens during the acute  phase of infection. The expected result is Negative. Fact Sheet for Patients:  BoilerBrush.com.cy Fact Sheet for Healthcare Providers: https://pope.com/ This test is not yet approved or cleared by the [place] FDA and has been authorized for detection and/or diagnosis of SARS-CoV-2 by FDA under an Emergency Use Authorization (EUA).  This EUA will remain in effect (meaning this test can be used) for the duration of the COVID-19 declaration under Section 564(b)(1) of the Act, 21 U.S.C. section 360bbb-3(b)(1), unless the authorization is terminated or revoked sooner. Performed at [facility], [address] [place], [place] [address]   Blood Culture ID Panel (Reflexed)     Status: Abnormal   Collection Time: 06/30/19  9:06 AM  Result Value Ref Range Status   Enterococcus species NOT DETECTED NOT DETECTED Final   Listeria monocytogenes NOT DETECTED NOT DETECTED Final   Staphylococcus species DETECTED (A) NOT DETECTED Final    Comment: Methicillin (oxacillin) susceptible coagulase negative staphylococcus. Possible blood culture contaminant (unless isolated from more than one blood culture draw or clinical case suggests pathogenicity). No antibiotic treatment is indicated for blood  culture contaminants. CRITICAL RESULT CALLED TO, READ BACK BY AND VERIFIED WITH: KRISTIN MERRILL @1151  ON 07/01/2019 BY FMW    Staphylococcus aureus (BCID) NOT DETECTED NOT DETECTED Final    Methicillin resistance NOT DETECTED NOT  DETECTED Final   Streptococcus species NOT DETECTED NOT DETECTED Final   Streptococcus agalactiae NOT DETECTED NOT DETECTED Final   Streptococcus pneumoniae NOT DETECTED NOT DETECTED Final   Streptococcus pyogenes NOT DETECTED NOT DETECTED Final   Acinetobacter baumannii NOT DETECTED NOT DETECTED Final   Enterobacteriaceae species NOT DETECTED NOT DETECTED Final   Enterobacter cloacae complex NOT DETECTED NOT DETECTED Final   Escherichia coli NOT DETECTED NOT DETECTED Final   Klebsiella oxytoca NOT DETECTED NOT DETECTED Final   Klebsiella pneumoniae NOT DETECTED NOT DETECTED Final   Proteus species NOT DETECTED NOT DETECTED Final   Serratia marcescens NOT DETECTED NOT DETECTED Final   Haemophilus influenzae NOT DETECTED NOT DETECTED Final   Neisseria meningitidis NOT DETECTED NOT DETECTED Final   Pseudomonas aeruginosa NOT DETECTED NOT DETECTED Final   Candida albicans NOT DETECTED NOT DETECTED Final   Candida glabrata NOT DETECTED NOT DETECTED Final   Candida krusei NOT DETECTED NOT DETECTED Final   Candida parapsilosis NOT DETECTED NOT DETECTED Final   Candida tropicalis NOT DETECTED NOT DETECTED Final    Comment: Performed at [facility], [address] [place], [place] [address]  MRSA PCR Screening     Status: None   Collection Time: 06/30/19  1:34 PM   Specimen: Nasopharyngeal  Result Value Ref Range Status   MRSA by PCR NEGATIVE NEGATIVE Final    Comment:        The GeneXpert MRSA Assay (FDA approved for NASAL specimens only), is one component of a comprehensive MRSA colonization surveillance program. It is not intended to diagnose MRSA infection nor to guide or monitor treatment for MRSA infections. Performed at [facility], [address] [place], [place] [address]     RADIOLOGY:  No results found.   Management plans discussed with the patient, family and they are in agreement.  CODE STATUS:       Code Status Orders  (From admission, onward)         Start     Ordered   06/30/19 1105  Do not attempt resuscitation (DNR)  Continuous    Question Answer Comment  In the event of cardiac or respiratory ARREST Do not call a code blue   In the event of cardiac or respiratory ARREST Do not perform Intubation, CPR, defibrillation or ACLS   In the event of cardiac or respiratory ARREST Use medication by any route, position, wound care, and other measures to relive pain and suffering. May use oxygen, suction and manual treatment of airway obstruction as needed for comfort.      06/30/19 1106        Code Status History    Date Active Date Inactive Code Status Order ID Comments User Context   03/27/2019 1729 03/31/2019 2030 DNR [id number]  Erick BlinksMemon, Jehanzeb, MD Inpatient   Advance Care Planning Activity    Advance Directive Documentation     Most Recent Value  Type of Advance Directive  Out of facility DNR (pink MOST or yellow form)  Pre-existing out of facility DNR order (yellow form or pink MOST form)  Yellow form placed in chart (order not valid for inpatient use)  "MOST" Form in Place?  --      TOTAL TIME TAKING CARE OF THIS PATIENT: 38 minutes.    Enid Baasadhika Sylvan Lahm M.D on 07/03/2019 at 10:02 AM  Between 7am to 6pm - Pager - [phone]  After 6pm go to [email or web address] - Therapist, nutritionalpassword EPAS [facility]  [employer] [facility]  Office  [phone]  CC: Primary care physician; Patient, [name] Per   Note: This dictation was prepared with Dragon dictation along with smaller phrase technology. Any transcriptional errors that result from this process are unintentional.

## 2019-07-03 NOTE — TOC Transition Note (Signed)
Transition of Care [facility]) - CM/SW Discharge Note   Patient Details  Name: Dwayne Bridges MRN: [id number] Date of Birth: [birth year]  Transition of Care [facility]) CM/SW Contact:  Ross Ludwig, LCSW Phone Number: 07/03/2019, 2:00 PM   Clinical Narrative:     Patient is a 68 year old male who is alert and oriented x3.  Patient is a long term care resident at [facility].  Patient has been at SNF for about 5 years per his niece.  Patient has some confusion assessment completed by speaking to patient's niece Stanton Kidney [phone].  Patient has been at SNF for several years, per patient's niece, they have been pleased with the care that he is receiving.  Patient's niece reported that the SNF likes him and call him, "the mayor."  Patient's niece stated she is not happy with the no visitor policy that the SNF has because of Covid, but she understands.  She hopes that the restrictions will be released soon.  CSW informed her that unfortunately all the SNFs have a similar arrangement.  Patient's niece did not express any other concerns, she wanted an update from the physician, CSW contacted the physician to ask her to call.  Physician said she will call after meeting she was in. Patient to be d/c'ed today to [facility] room 209B.  Patient and family agreeable to plans will transport via ems RN to call report to room 209B [place] nurses station [phone].  Patient will be discharging on a Bipap, CSW was informed by SNF that it has arrived.  Patient's niece is aware that he is discharging today.      Final next level of care: Skilled Nursing Facility Barriers to Discharge: Barriers Resolved   Patient Goals and CMS Choice Patient states their goals for this hospitalization and ongoing recovery are:: To return to SNF CMS Medicare.gov Compare Post Acute Care list provided to:: Patient Represenative (must comment) Choice offered to / list presented to : [facility] POA / Guardian, Patient  Discharge  Placement   Existing PASRR number confirmed : 07/03/19            Patient to be transferred to facility by: [facility] EMS Name of family member notified: Fransisca Connors Niece [phone] Patient and family notified of of transfer: 07/03/19  Discharge Plan and Services In-house Referral: Clinical Social Work   Post Acute Care Choice: [place]          DME Arranged: N/A DME Agency: NA     Representative spoke with at DME Agency: na   [place]: NA     Representative spoke with at [place]: na  Social Determinants of Health ([place]) Interventions     Readmission Risk Interventions No flowsheet data found.

## 2019-07-03 NOTE — Progress Notes (Signed)
Nursing report called to "Jeani Hawking" at [facility].

## 2019-07-03 NOTE — Progress Notes (Addendum)
EMS transport called for transport back to [facility] facility at 3:55 pm. EMS has transported pt to [facility] at 5:55 pm.

## 2019-07-03 NOTE — NC FL2 (Signed)
[place] MEDICAID FL2 LEVEL OF CARE SCREENING TOOL     IDENTIFICATION  Patient Name: Dwayne Bridges W Jewell Birthdate: 1951/10/26 Sex: male Admission Date (Current Location): 06/30/2019  [place]  [place] [id number] [facility] Facility and Address:  [facility], [address], [place], [place] [address]      Provider Number: [id number]  Attending Physician Name and Address:  Enid BaasKalisetti, Radhika, MD  Relative Name and Phone Number:  [facility],MARY Niece [phone]    Current Level of Care: Hospital Recommended Level of Care: Skilled Nursing Facility Prior Approval Number:    Date Approved/Denied:   PASRR Number: [id number]  Discharge Plan: SNF    Current Diagnoses: Patient Active Problem List   Diagnosis Date Noted  . Acute respiratory failure with hypoxia (HCC) 06/30/2019  . Atrial fibrillation with RVR (HCC) 03/28/2019  . Acute on chronic diastolic CHF (congestive heart failure) (HCC) 03/27/2019  . Acute on chronic respiratory failure with hypoxia (HCC) 03/27/2019  . COVID-19 virus infection 03/27/2019  . HTN (hypertension) 03/27/2019  . BPH (benign prostatic hyperplasia) 03/27/2019  . HLD (hyperlipidemia) 03/27/2019  . CHF exacerbation (HCC) 03/27/2019    Orientation RESPIRATION BLADDER Height & Weight     Self, Place  O2, Other (Comment)(Bipap at night) Incontinent Weight: 227 lb 8 oz (103.2 kg) Height:  5\' 10"  (177.8 cm)  BEHAVIORAL SYMPTOMS/MOOD NEUROLOGICAL BOWEL NUTRITION STATUS      Incontinent Diet(Regular diet)  AMBULATORY STATUS COMMUNICATION OF NEEDS Skin   Limited Assist Verbally Normal                       Personal Care Assistance Level of Assistance  Bathing, Dressing, Feeding Bathing Assistance: Limited assistance Feeding assistance: Limited assistance Dressing Assistance: Limited assistance     Functional Limitations Info  Sight, Speech, Hearing Sight Info: Adequate Hearing  Info: Adequate Speech Info: Adequate    SPECIAL CARE FACTORS FREQUENCY  PT (By licensed PT), OT (By licensed OT)     PT Frequency: Minimum 5x a week OT Frequency: Minimum 5x a week            Contractures Contractures Info: Not present    Additional Factors Info  Code Status, Allergies, Psychotropic Code Status Info: DNR Allergies Info: No Known Allergies Psychotropic Info: traZODone (DESYREL) tablet 50 mg         Current Medications (07/03/2019):  This is the current hospital active medication list Current Facility-Administered Medications  Medication Dose Route Frequency Provider Last Rate Last Dose  . acetaminophen (TYLENOL) tablet 650 mg  650 mg Oral Q6H PRN Jimmye Normanuma, Elizabeth Achieng, NP   650 mg at 07/02/19 1449  . apixaban (ELIQUIS) tablet 5 mg  5 mg Oral BID Jimmye Normanuma, Elizabeth Achieng, NP   5 mg at 07/02/19 2332  . diltiazem (CARDIZEM CD) 24 hr capsule 180 mg  180 mg Oral Daily Jimmye Normanuma, Elizabeth Achieng, NP   180 mg at 07/02/19 1010  . finasteride (PROSCAR) tablet 5 mg  5 mg Oral Daily Jimmye Normanuma, Elizabeth Achieng, NP   5 mg at 07/02/19 1010  . furosemide (LASIX) tablet 40 mg  40 mg Oral BID Enedina FinnerPatel, Sona, MD   40 mg at 07/02/19 1631  . ipratropium-albuterol (DUONEB) 0.5-2.5 (3) MG/3ML nebulizer solution 3 mL  3 mL Nebulization Q6H PRN Merwyn KatosSimonds, David B, MD      . lisinopril (ZESTRIL) tablet 5 mg  5 mg Oral Daily Jimmye Normanuma, Elizabeth Achieng, NP   5 mg at 07/02/19 1010  .  multivitamin with minerals tablet 1 tablet  1 tablet Oral Daily Lang Snow, NP   1 tablet at 07/02/19 1010  . senna-docusate (Senokot-S) tablet 1 tablet  1 tablet Oral BID Lang Snow, NP   1 tablet at 07/02/19 1010  . terazosin (HYTRIN) capsule 10 mg  10 mg Oral QHS Lang Snow, NP   10 mg at 07/03/19 0201  . traZODone (DESYREL) tablet 50 mg  50 mg Oral QHS Lang Snow, NP   50 mg at 07/02/19 2331     Discharge Medications: Please see discharge summary for a list of  discharge medications.  Relevant Imaging Results:  Relevant Lab Results:   Additional Information SSN [id number]  Ross Ludwig, LCSW

## 2019-07-05 LAB — CULTURE, BLOOD (ROUTINE X 2)
Culture: NO GROWTH
Special Requests: ADEQUATE

## 2019-07-08 NOTE — Progress Notes (Deleted)
   [name], male    DOB: 01/25/1951, 68 y.o.   MRN: [id number]  HPI  Dwayne Bridges is a 68 y/o male with a history of   Echo report from 06/30/2019 reviewed but no EF reported. Does have mild TR present. EF in 2015 was 60-65%.  Admitted 06/30/2019 due to acute on chronic HF. Initially needed bipap. Cardiology consult obtained. Symptoms improved with IV lasix and then transitioned to oral diuretics. Discharged after 3 days.   He presents today for his initial visit with a chief complaint of   Review of Systems    Physical Exam    Assessment & Plan:  1: Chronic heart failure with preserved ejection fraction- - NYHA class - BNP 06/30/2019 was 168.0  2: HTN- - BP - BMP 07/02/2019 reviewed and showed sodium 137, potassium 3.0 (repeated next day was 4.1), creatinine 0.86 and GFR >60  3: Atrial fibrillation- - had telemedicine visit with cardiology Minette Brine) 04/13/2019

## 2019-07-09 ENCOUNTER — Telehealth: Payer: Self-pay | Admitting: Family

## 2019-07-09 ENCOUNTER — Ambulatory Visit: Payer: Medicaid Other | Admitting: Family

## 2019-07-09 NOTE — Telephone Encounter (Signed)
Patient did not show for his Heart Failure [facility] appointment on 07/09/2019. Will attempt to reschedule.

## 2019-07-10 ENCOUNTER — Ambulatory Visit: Payer: Medicaid Other | Attending: Neurology

## 2019-09-04 ENCOUNTER — Ambulatory Visit: Payer: Medicaid Other | Attending: Neurology

## 2019-09-04 DIAGNOSIS — G4733 Obstructive sleep apnea (adult) (pediatric): Secondary | ICD-10-CM | POA: Insufficient documentation

## 2019-09-04 DIAGNOSIS — F5101 Primary insomnia: Secondary | ICD-10-CM | POA: Diagnosis present

## 2019-09-07 ENCOUNTER — Other Ambulatory Visit: Payer: Self-pay

## 2019-09-14 ENCOUNTER — Ambulatory Visit: Payer: Medicaid Other | Attending: Neurology

## 2019-09-14 DIAGNOSIS — G4733 Obstructive sleep apnea (adult) (pediatric): Secondary | ICD-10-CM | POA: Diagnosis present

## 2019-09-14 DIAGNOSIS — I509 Heart failure, unspecified: Secondary | ICD-10-CM | POA: Diagnosis not present

## 2019-10-22 ENCOUNTER — Other Ambulatory Visit: Payer: Self-pay

## 2020-01-07 ENCOUNTER — Emergency Department: Payer: No Typology Code available for payment source

## 2020-01-07 ENCOUNTER — Encounter: Payer: Self-pay | Admitting: Emergency Medicine

## 2020-01-07 ENCOUNTER — Inpatient Hospital Stay: Payer: No Typology Code available for payment source

## 2020-01-07 ENCOUNTER — Other Ambulatory Visit: Payer: Self-pay

## 2020-01-07 ENCOUNTER — Inpatient Hospital Stay
Admission: EM | Admit: 2020-01-07 | Discharge: 2020-01-12 | DRG: 291 | Disposition: A | Discharge status: Skilled Nursing Facility | Payer: No Typology Code available for payment source | Source: Skilled Nursing Facility | Attending: Internal Medicine | Admitting: Internal Medicine

## 2020-01-07 DIAGNOSIS — Z6830 Body mass index (BMI) 30.0-30.9, adult: Secondary | ICD-10-CM

## 2020-01-07 DIAGNOSIS — Z7901 Long term (current) use of anticoagulants: Secondary | ICD-10-CM | POA: Diagnosis not present

## 2020-01-07 DIAGNOSIS — G4730 Sleep apnea, unspecified: Secondary | ICD-10-CM | POA: Diagnosis present

## 2020-01-07 DIAGNOSIS — E669 Obesity, unspecified: Secondary | ICD-10-CM | POA: Diagnosis present

## 2020-01-07 DIAGNOSIS — R7989 Other specified abnormal findings of blood chemistry: Secondary | ICD-10-CM | POA: Diagnosis present

## 2020-01-07 DIAGNOSIS — J9621 Acute and chronic respiratory failure with hypoxia: Secondary | ICD-10-CM | POA: Diagnosis present

## 2020-01-07 DIAGNOSIS — Z8616 Personal history of COVID-19: Secondary | ICD-10-CM

## 2020-01-07 DIAGNOSIS — I5033 Acute on chronic diastolic (congestive) heart failure: Secondary | ICD-10-CM | POA: Diagnosis present

## 2020-01-07 DIAGNOSIS — Z7951 Long term (current) use of inhaled steroids: Secondary | ICD-10-CM

## 2020-01-07 DIAGNOSIS — E785 Hyperlipidemia, unspecified: Secondary | ICD-10-CM | POA: Diagnosis not present

## 2020-01-07 DIAGNOSIS — K219 Gastro-esophageal reflux disease without esophagitis: Secondary | ICD-10-CM | POA: Diagnosis present

## 2020-01-07 DIAGNOSIS — Z79899 Other long term (current) drug therapy: Secondary | ICD-10-CM | POA: Diagnosis not present

## 2020-01-07 DIAGNOSIS — G9341 Metabolic encephalopathy: Secondary | ICD-10-CM | POA: Diagnosis present

## 2020-01-07 DIAGNOSIS — I248 Other forms of acute ischemic heart disease: Secondary | ICD-10-CM | POA: Diagnosis present

## 2020-01-07 DIAGNOSIS — I482 Chronic atrial fibrillation, unspecified: Secondary | ICD-10-CM | POA: Diagnosis present

## 2020-01-07 DIAGNOSIS — Z66 Do not resuscitate: Secondary | ICD-10-CM | POA: Diagnosis present

## 2020-01-07 DIAGNOSIS — I110 Hypertensive heart disease with heart failure: Principal | ICD-10-CM | POA: Diagnosis present

## 2020-01-07 DIAGNOSIS — F419 Anxiety disorder, unspecified: Secondary | ICD-10-CM | POA: Diagnosis present

## 2020-01-07 DIAGNOSIS — Z87891 Personal history of nicotine dependence: Secondary | ICD-10-CM

## 2020-01-07 DIAGNOSIS — I10 Essential (primary) hypertension: Secondary | ICD-10-CM | POA: Diagnosis not present

## 2020-01-07 DIAGNOSIS — R778 Other specified abnormalities of plasma proteins: Secondary | ICD-10-CM

## 2020-01-07 DIAGNOSIS — I509 Heart failure, unspecified: Secondary | ICD-10-CM

## 2020-01-07 DIAGNOSIS — F319 Bipolar disorder, unspecified: Secondary | ICD-10-CM | POA: Diagnosis present

## 2020-01-07 HISTORY — DX: Anxiety disorder, unspecified: F41.9

## 2020-01-07 HISTORY — DX: Disorientation, unspecified: R41.0

## 2020-01-07 HISTORY — DX: Gastro-esophageal reflux disease without esophagitis: K21.9

## 2020-01-07 HISTORY — DX: Hypokalemia: E87.6

## 2020-01-07 HISTORY — DX: Bipolar disorder, unspecified: F31.9

## 2020-01-07 HISTORY — DX: Sleep apnea, unspecified: G47.30

## 2020-01-07 LAB — COMPREHENSIVE METABOLIC PANEL
ALT: 24 U/L (ref 0–44)
AST: 48 U/L — ABNORMAL HIGH (ref 15–41)
Albumin: 2.7 g/dL — ABNORMAL LOW (ref 3.5–5.0)
Alkaline Phosphatase: 56 U/L (ref 38–126)
Anion gap: 10 (ref 5–15)
BUN: 21 mg/dL (ref 8–23)
CO2: 42 mmol/L — ABNORMAL HIGH (ref 22–32)
Calcium: 8.9 mg/dL (ref 8.9–10.3)
Chloride: 91 mmol/L — ABNORMAL LOW (ref 98–111)
Creatinine, Ser: 0.66 mg/dL (ref 0.61–1.24)
GFR calc Af Amer: >60 mL/min (ref >60)
GFR calc non Af Amer: >60 mL/min (ref >60)
Glucose, Bld: 141 mg/dL — ABNORMAL HIGH (ref 70–99)
Potassium: 4.7 mmol/L (ref 3.5–5.1)
Sodium: 143 mmol/L (ref 135–145)
Total Bilirubin: 1.4 mg/dL — ABNORMAL HIGH (ref 0.3–1.2)
Total Protein: 7.4 g/dL (ref 6.5–8.1)

## 2020-01-07 LAB — CBC
HCT: 39.6 % (ref 39.0–52.0)
Hemoglobin: 12.1 g/dL — ABNORMAL LOW (ref 13.0–17.0)
MCH: 29.4 pg (ref 26.0–34.0)
MCHC: 30.6 g/dL (ref 30.0–36.0)
MCV: 96.1 fL (ref 80.0–100.0)
Platelets: 140 10*3/uL — ABNORMAL LOW (ref 150–400)
RBC: 4.12 MIL/uL — ABNORMAL LOW (ref 4.22–5.81)
RDW: 15.3 % (ref 11.5–15.5)
WBC: 6.7 10*3/uL (ref 4.0–10.5)
nRBC: 0 % (ref 0.0–0.2)

## 2020-01-07 LAB — TROPONIN I (HIGH SENSITIVITY)
Troponin I (High Sensitivity): 185 ng/L (ref <18)
Troponin I (High Sensitivity): 238 ng/L (ref <18)
Troponin I (High Sensitivity): 230 ng/L (ref <18)
Troponin I (High Sensitivity): 275 ng/L (ref <18)
Troponin I (High Sensitivity): 238 ng/L (ref <18)

## 2020-01-07 LAB — POC SARS CORONAVIRUS 2 AG: SARS Coronavirus 2 Ag: NEGATIVE

## 2020-01-07 LAB — SARS CORONAVIRUS 2 (TAT 6-24 HRS): SARS Coronavirus 2: NEGATIVE

## 2020-01-07 LAB — BRAIN NATRIURETIC PEPTIDE: B Natriuretic Peptide: 240 pg/mL — ABNORMAL HIGH (ref 0.0–100.0)

## 2020-01-07 LAB — MRSA PCR SCREENING: MRSA by PCR: NEGATIVE

## 2020-01-07 MED ORDER — DIVALPROEX SODIUM 125 MG PO CSDR
375.0000 mg | DELAYED_RELEASE_CAPSULE | Freq: Two times a day (BID) | ORAL | Status: DC
  Administered 2020-01-07 – 2020-01-12 (×9): 375 mg via ORAL
  Filled 2020-01-07 (×11): qty 3

## 2020-01-07 MED ORDER — DM-GUAIFENESIN ER 30-600 MG PO TB12
1.0000 | ORAL_TABLET | Freq: Two times a day (BID) | ORAL | Status: DC
  Administered 2020-01-07 – 2020-01-12 (×8): 1 via ORAL
  Filled 2020-01-07 (×10): qty 1

## 2020-01-07 MED ORDER — ADULT MULTIVITAMIN W/MINERALS CH
1.0000 | ORAL_TABLET | Freq: Every day | ORAL | Status: DC
  Administered 2020-01-08 – 2020-01-12 (×5): 1 via ORAL
  Filled 2020-01-07 (×5): qty 1

## 2020-01-07 MED ORDER — FUROSEMIDE 10 MG/ML IJ SOLN
60.0000 mg | Freq: Two times a day (BID) | INTRAMUSCULAR | Status: DC
  Administered 2020-01-08 – 2020-01-09 (×3): 60 mg via INTRAVENOUS
  Filled 2020-01-07 (×3): qty 6

## 2020-01-07 MED ORDER — SODIUM CHLORIDE 0.9% FLUSH
3.0000 mL | Freq: Two times a day (BID) | INTRAVENOUS | Status: DC
  Administered 2020-01-07 – 2020-01-12 (×10): 3 mL via INTRAVENOUS

## 2020-01-07 MED ORDER — DILTIAZEM HCL ER COATED BEADS 180 MG PO CP24
180.0000 mg | ORAL_CAPSULE | Freq: Every day | ORAL | Status: DC
  Administered 2020-01-08 – 2020-01-12 (×5): 180 mg via ORAL
  Filled 2020-01-07 (×5): qty 1

## 2020-01-07 MED ORDER — ONDANSETRON HCL 4 MG/2ML IJ SOLN: 4.0000 mg | Freq: Four times a day (QID) | INTRAMUSCULAR | Status: DC | PRN

## 2020-01-07 MED ORDER — IPRATROPIUM BROMIDE 0.02 % IN SOLN
2.5000 mL | Freq: Four times a day (QID) | RESPIRATORY_TRACT | Status: DC
  Administered 2020-01-07 – 2020-01-08 (×2): 0.5 mg via RESPIRATORY_TRACT
  Filled 2020-01-07 (×2): qty 2.5

## 2020-01-07 MED ORDER — ACETAMINOPHEN 325 MG PO TABS
650.0000 mg | ORAL_TABLET | Freq: Four times a day (QID) | ORAL | Status: DC | PRN
  Administered 2020-01-09: 14:00:00 650 mg via ORAL
  Filled 2020-01-07: qty 2

## 2020-01-07 MED ORDER — FUROSEMIDE 10 MG/ML IJ SOLN: 60.0000 mg | Freq: Once | INTRAMUSCULAR | Status: DC

## 2020-01-07 MED ORDER — FINASTERIDE 5 MG PO TABS
5.0000 mg | ORAL_TABLET | Freq: Every day | ORAL | Status: DC
  Administered 2020-01-08 – 2020-01-12 (×5): 5 mg via ORAL
  Filled 2020-01-07 (×5): qty 1

## 2020-01-07 MED ORDER — ALBUTEROL SULFATE (2.5 MG/3ML) 0.083% IN NEBU: 2.5000 mg | INHALATION_SOLUTION | RESPIRATORY_TRACT | Status: DC | PRN

## 2020-01-07 MED ORDER — TERAZOSIN HCL 5 MG PO CAPS
10.0000 mg | ORAL_CAPSULE | Freq: Every day | ORAL | Status: DC
  Administered 2020-01-07 – 2020-01-11 (×3): 10 mg via ORAL
  Filled 2020-01-07 (×6): qty 2

## 2020-01-07 MED ORDER — HYDRALAZINE HCL 25 MG PO TABS: 25.0000 mg | ORAL_TABLET | Freq: Three times a day (TID) | ORAL | Status: DC | PRN

## 2020-01-07 MED ORDER — LISINOPRIL 5 MG PO TABS
2.5000 mg | ORAL_TABLET | Freq: Every day | ORAL | Status: DC
  Administered 2020-01-08 – 2020-01-11 (×4): 2.5 mg via ORAL
  Filled 2020-01-07 (×5): qty 1

## 2020-01-07 MED ORDER — SODIUM CHLORIDE 0.9 % IV SOLN: 250.0000 mL | INTRAVENOUS | Status: DC | PRN

## 2020-01-07 MED ORDER — SODIUM CHLORIDE 0.9% FLUSH
3.0000 mL | INTRAVENOUS | Status: DC | PRN
  Administered 2020-01-11: 3 mL via INTRAVENOUS

## 2020-01-07 MED ORDER — ATORVASTATIN CALCIUM 20 MG PO TABS
20.0000 mg | ORAL_TABLET | Freq: Every day | ORAL | Status: DC
  Administered 2020-01-07 – 2020-01-11 (×4): 20 mg via ORAL
  Filled 2020-01-07 (×5): qty 1

## 2020-01-07 MED ORDER — APIXABAN 5 MG PO TABS
5.0000 mg | ORAL_TABLET | Freq: Two times a day (BID) | ORAL | Status: DC
  Administered 2020-01-07 – 2020-01-12 (×9): 5 mg via ORAL
  Filled 2020-01-07 (×10): qty 1

## 2020-01-07 MED ORDER — FUROSEMIDE 10 MG/ML IJ SOLN
80.0000 mg | Freq: Once | INTRAMUSCULAR | Status: AC
  Administered 2020-01-07: 15:00:00 80 mg via INTRAVENOUS
  Filled 2020-01-07: qty 8

## 2020-01-07 MED ORDER — SENNOSIDES-DOCUSATE SODIUM 8.6-50 MG PO TABS
1.0000 | ORAL_TABLET | Freq: Two times a day (BID) | ORAL | Status: DC
  Administered 2020-01-07 – 2020-01-12 (×9): 1 via ORAL
  Filled 2020-01-07 (×10): qty 1

## 2020-01-07 NOTE — Progress Notes (Signed)
Attempted to compete patient's admission database, however patient is very HOH and seems to be confused and disoriented. Per SNF documentation this appears to be his baseline. When asked what he was allergic to he responded "onions", and when asked if he had a P.O.A., he stated he has some "people". Unable to complete the remainder of the database d/t inconsistent and inaccurate answers. Armbands still applied per protocol. Also, REDS vest reading unable to be completed d/t "poor quality" (twice). MRSA swab sent for testing, per protocol. Jari Favre [facility]

## 2020-01-07 NOTE — ED Notes (Signed)
Pt brief changed and is dry/clean at this time. Pt placed on condom cath. Will continue to monitor.

## 2020-01-07 NOTE — H&P (Signed)
History and Physical    Dwayne Bridges DOB: 12/29/1950 DOA: 01/07/2020  Referring MD/NP/PA:   PCP: Patient, [name] Per   Patient coming from:  The patient is coming from SNF.  At baseline, pt is dependent for most of ADL.        Chief Complaint: SOB and confusion  HPI: Dwayne Bridges is a 69 y.o. male with medical history significant of hypertension, hyperlipidemia, GERD, anxiety, dCHF, bipolar disorder, COVID-19 infection May/2020, atrial fibrillation on Eliquis, who presents with shortness breath and confusion.  Pt is from SNF. Pt was noted to have SOB in the facility. He had oxygen desaturation to 50s% per report.  Currently 94% on 4 L nasal cannula oxygen.  Patient denies any chest pain.  No active cough.  No active nausea, vomiting, diarrhea noted.  Does not seem to have abdominal pain.  Patient is mildly confused.  Per her niece (I called her niece by phone), patient normally is alert, orientated x3.  Currently patient knows his own name, is not oriented to time and place.  ED Course: pt was found to have BNP 240, troponin I 85, Covid Ag test negative, pending Covid PCR, electrolytes renal function okay, temperature normal, blood pressure 134/75, heart rate in 90s, RR 18.  Chest x-ray showed bilateral pulmonary edema.  Patient is admitted to telemetry bed as inpatient.  Review of Systems: Could not reviewed accurately due to altered mental status  Allergy: No Known Allergies  Past Medical History:  Diagnosis Date  . Anxiety   . Bipolar disorder, unspecified ([place])   . CHF (congestive heart failure) ([place])   . Disorientation, unspecified   . GERD (gastroesophageal reflux disease)   . Hypertension   . Hypokalemia   . Sleep apnea, unspecified     History reviewed. No pertinent surgical history.  Social History:  reports that he quit smoking about 11 years ago. His smoking use included cigarettes. He has a 5.00 pack-year smoking history. He has never used smokeless  tobacco. No history on file for alcohol and drug.  Family History: No family history on file.  Could not be reviewed accurately due to altered mental status  Prior to Admission medications   Medication Sig Start Date End Date Taking? Authorizing Provider  acetaminophen (TYLENOL) 325 MG tablet Take 650 mg by mouth every 6 (six) hours as needed (leg pain).    [provider]  apixaban (ELIQUIS) 5 MG TABS tablet Take 1 tablet (5 mg total) by mouth 2 (two) times daily. 03/31/19   Thurnell Lose, MD  atorvastatin (LIPITOR) 20 MG tablet Take 20 mg by mouth at bedtime.     [provider]  diltiazem (CARDIZEM) 60 MG tablet Take 1 tablet (60 mg total) by mouth 2 (two) times daily as needed (for HR >110). 07/03/19 07/02/20  Gladstone Lighter, MD  divalproex (DEPAKOTE SPRINKLE) 125 MG capsule Take 375 mg by mouth 2 (two) times daily.    [provider]  finasteride (PROSCAR) 5 MG tablet Take 5 mg by mouth daily.    [provider]  furosemide (LASIX) 40 MG tablet Take 1 tablet (40 mg total) by mouth 2 (two) times daily. 03/31/19   Thurnell Lose, MD  ipratropium-albuterol (DUONEB) 0.5-2.5 (3) MG/3ML SOLN Take 3 mLs by nebulization every 6 (six) hours as needed (wheezing, shortness of breath). 07/03/19   Gladstone Lighter, MD  Multiple Vitamin (MULTIVITAMIN) tablet Take 1 tablet by mouth daily.    [provider]  potassium chloride (K-DUR) 20 MEQ tablet Take 1 tablet (20 mEq total) by mouth daily. 03/31/19   Leroy Sea, MD  sennosides-docusate sodium (SENOKOT-S) 8.6-50 MG tablet Take 1 tablet by mouth 2 (two) times daily.    [provider]  terazosin (HYTRIN) 10 MG capsule Take 10 mg by mouth at bedtime.    [provider]  traZODone (DESYREL) 50 MG tablet Take 50 mg by mouth at bedtime.    [provider]    Physical Exam: Vitals:   01/07/20 1511 01/07/20 1530 01/07/20 1600 01/07/20 1630  BP:  133/88 130/80 104/84    Pulse:  68 94 66  Resp:  (!) 26 (!) 22 (!) 25  Temp:      TempSrc:      SpO2: 99% 100% 100% 94%  Weight:      Height:       General: Not in acute distress HEENT:       Eyes: PERRL, EOMI, no scleral icterus.       ENT: No discharge from the ears and nose, no pharynx injection, no tonsillar enlargement.        Neck: Positive JVD, no bruit, no mass felt. Heme: No neck lymph node enlargement. Cardiac: S1/S2, RRR, No murmurs, No gallops or rubs. Respiratory: has fine crackles bilaterally GI: Soft, nondistended, nontender, no rebound pain, no organomegaly, BS present. GU: No hematuria Ext: 1+ pitting leg edema bilaterally. 1+DP/PT pulse bilaterally. Musculoskeletal: No joint deformities, No joint redness or warmth, no limitation of ROM in spin. Skin: No rashes.  Neuro: confused, know his own name, but not oriented to place and time. Cranial nerves II-XII grossly intact, moves all extremities. Psych: Patient is not psychotic, no suicidal or hemocidal ideation.  Labs on Admission: I have personally reviewed following labs and imaging studies  CBC: Recent Labs  Lab 01/07/20 1259  WBC 6.7  HGB 12.1*  HCT 39.6  MCV 96.1  PLT 140*   Basic Metabolic Panel: Recent Labs  Lab 01/07/20 1259  NA 143  K 4.7  CL 91*  CO2 42*  GLUCOSE 141*  BUN 21  CREATININE 0.66  CALCIUM 8.9   GFR: Estimated Creatinine Clearance: 110 mL/min (by C-G formula based on SCr of 0.66 mg/dL). Liver Function Tests: Recent Labs  Lab 01/07/20 1259  AST 48*  ALT 24  ALKPHOS 56  BILITOT 1.4*  PROT 7.4  ALBUMIN 2.7*   No results for input(s): LIPASE, AMYLASE in the last 168 hours. No results for input(s): AMMONIA in the last 168 hours. Coagulation Profile: No results for input(s): INR, PROTIME in the last 168 hours. Cardiac Enzymes: No results for input(s): CKTOTAL, CKMB, CKMBINDEX, TROPONINI in the last 168 hours. BNP (last 3 results) No results for input(s): PROBNP in the last 8760  hours. HbA1C: No results for input(s): HGBA1C in the last 72 hours. CBG: No results for input(s): GLUCAP in the last 168 hours. Lipid Profile: No results for input(s): CHOL, HDL, LDLCALC, TRIG, CHOLHDL, LDLDIRECT in the last 72 hours. Thyroid Function Tests: No results for input(s): TSH, T4TOTAL, FREET4, T3FREE, THYROIDAB in the last 72 hours. Anemia Panel: No results for input(s): VITAMINB12, FOLATE, FERRITIN, TIBC, IRON, RETICCTPCT in the last 72 hours. Urine analysis:    Component Value Date/Time   COLORURINE AMBER (A) 06/30/2019 0902   APPEARANCEUR CLOUDY (A) 06/30/2019 0902   APPEARANCEUR Clear 03/25/2014 2000   LABSPEC 1.019 06/30/2019 0902   LABSPEC 1.016 03/25/2014 2000   PHURINE 5.0 06/30/2019 0902  GLUCOSEU NEGATIVE 06/30/2019 0902   GLUCOSEU Negative 03/25/2014 2000   HGBUR NEGATIVE 06/30/2019 0902   BILIRUBINUR NEGATIVE 06/30/2019 0902   BILIRUBINUR Negative 03/25/2014 2000   KETONESUR NEGATIVE 06/30/2019 0902   PROTEINUR 100 (A) 06/30/2019 0902   NITRITE NEGATIVE 06/30/2019 0902   LEUKOCYTESUR NEGATIVE 06/30/2019 0902   LEUKOCYTESUR Negative 03/25/2014 2000   Sepsis Labs: @LABRCNTIP (procalcitonin:4,lacticidven:4) )No results found for this or any previous visit (from the past 240 hour(s)).   Radiological Exams on Admission: DG Chest Portable 1 View  Result Date: 01/07/2020 CLINICAL DATA:  Shortness of breath. EXAM: PORTABLE CHEST 1 VIEW COMPARISON:  July 01, 2019. FINDINGS: Stable cardiomegaly. No pneumothorax is noted. New bilateral perihilar and basilar opacities are noted concerning for edema or possibly inflammation. Small right pleural effusion is noted. Bony thorax is unremarkable. IMPRESSION: New bilateral perihilar and basilar opacities are noted concerning for edema or possibly inflammation. Small right pleural effusion is noted. Electronically Signed   By: July 03, 2019 M.D.   On: 01/07/2020 13:39     EKG: Independently reviewed.  Atrial  fibrillation, QTc 445, poor R wave progression, LAD.  Assessment/Plan Principal Problem:   Acute on chronic diastolic CHF (congestive heart failure) (HCC) Active Problems:   Acute on chronic respiratory failure with hypoxia (HCC)   HTN (hypertension)   HLD (hyperlipidemia)   Atrial fibrillation, chronic (HCC)   Bipolar disorder, unspecified (HCC)   Elevated troponin   Acute metabolic encephalopathy   Acute on chronic respiratory failure with hypoxia due to acute on chronic diastolic CHF (congestive heart failure) [facility]): Patient has 1+ leg edema, bilateral pulmonary edema on chest x-ray, elevated BNP, consistent with CHF exacerbation.  Pt had 2D echo on 06/30/2019, which showed:  "the left ventricle was not well visualized. The cavity size was normal. Left ventricular diastolic parameters were normal. No evidence of left ventricular regional wall motion abnormalities".  -will admit to tele bed as inpt -Lasix 60 mg bid by IV (patient received 80 mg of Lasix in ED) -trend trop -2d echo -Daily weights -strict I/O's -Low salt diet -Fluid restriction -Add lisinopril 2.5 mg daily  HTN:  -Continue home medications: Cardizem\ -Newly added lisinopril -hydralazine prn  HLD (hyperlipidemia) -lipitor  Atrial fibrillation, chronic (HCC): -Continue Cardizem and Eliquis  Bipolar disorder, unspecified (HCC): -Depakote  Elevated troponin: Troponin I 85.  No chest pain.  Possibly due to demand ischemia secondary to CHF exacerbation -Continue Lipitor -Check A1c, FLP -Trend troponin  Acute metabolic encephalopathy: Etiology is not clear.  May be due to hypoxia. -Follow-up CT head -Frequent neuro check    Inpatient status:  # Patient requires inpatient status due to high intensity of service, high risk for further deterioration and high frequency of surveillance required.  I certify that at the point of admission it is my clinical judgment that the patient will require inpatient  hospital care spanning beyond 2 midnights from the point of admission.  . This patient has multiple chronic comorbidities including hypertension, hyperlipidemia, GERD, anxiety, dCHF, bipolar disorder, COVID-19 infection May/2020, atrial fibrillation on Eliquis . Now patient has presenting with acute on chronic diastolic CHF and acute metabolic encephalopathy . The worrisome physical exam findings include bilateral leg edema, positive JVD, crackles on auscultation . The initial radiographic and laboratory data are worrisome because of elevated troponin, elevated BNP, bilateral pulmonary edema chest x-ray . Current medical needs: please see my assessment and plan . Predictability of an adverse outcome (risk): Patient has multiple comorbidities as listed above. Now  presents with  acute on chronic diastolic CHF and acute metabolic encephalopathy. Patient's presentation is highly complicated.  Patient is at high risk of deteriorating.  Will need to be treated in hospital for at least 2 days.             DVT ppx: Eliquis Code Status: DNR (confirmed with his Niece) Family Communication:  Yes, patient's Niece by phone Disposition Plan:  Anticipate discharge back to previous home environment Consults called:  None Admission status:  Tele bed as inpt      Date of Service 01/07/2020    Lorretta Harp [facility]   If 7PM-7AM, please contact night-coverage [email or web address] 01/07/2020, 4:53 PM

## 2020-01-07 NOTE — ED Triage Notes (Signed)
Pt comes into the ED via EMS from [facility] with c/o sob with sats in the 50's, states staff reported pt was found off his normal 2L [place], pt arrived on a NRB on 10L, pt placed on 2L [place] and sats dropped to 87%, O2 increased to 4L  and now 93%. Pt is in NAD.

## 2020-01-07 NOTE — ED Provider Notes (Signed)
[facility] Emergency Department Provider Note  Time seen: 1:09 PM  I have reviewed the triage vital signs and the nursing notes.   HISTORY  Chief Complaint Shortness of Breath   HPI Dwayne Bridges is a 69 y.o. male with a past medical history of CHF, hypertension, atrial fibrillation, presents to the emergency department for shortness of breath.  According to EMS patient lives at [facility] home was found to be off of his oxygen with a room air saturation in the 50s per report.  Patient is normally on 2 L of oxygen.  Patient arrived on a nonrebreather mask with sats at 100%.  Shortly after arrival patient was titrated down to 2 L via nasal cannula with saturations staying in the upper 80s, 4 L saturations staying in the low to mid 90s.  Patient is awake alert, he has no acute complaints.  Denies any shortness of breath denies any chest pain.  States lower back pain but states this is chronic.  Patient is afebrile denies any cough.   Past Medical History:  Diagnosis Date  . CHF (congestive heart failure) ([place])   . Hypertension     Patient Active Problem List   Diagnosis Date Noted  . Acute respiratory failure with hypoxia ([place]) 06/30/2019  . Atrial fibrillation with RVR ([place]) 03/28/2019  . Acute on chronic diastolic CHF (congestive heart failure) ([place]) 03/27/2019  . Acute on chronic respiratory failure with hypoxia ([place]) 03/27/2019  . COVID-19 virus infection 03/27/2019  . HTN (hypertension) 03/27/2019  . BPH (benign prostatic hyperplasia) 03/27/2019  . HLD (hyperlipidemia) 03/27/2019  . CHF exacerbation ([place]) 03/27/2019    No past surgical history on file.  Prior to Admission medications   Medication Sig Start Date End Date Taking? Authorizing Provider  acetaminophen (TYLENOL) 325 MG tablet Take 650 mg by mouth every 6 (six) hours as needed (leg pain).    [provider]  apixaban (ELIQUIS) 5 MG TABS tablet Take 1 tablet (5 mg  total) by mouth 2 (two) times daily. 03/31/19   Thurnell Lose, MD  atorvastatin (LIPITOR) 20 MG tablet Take 20 mg by mouth at bedtime.     [provider]  diltiazem (CARDIZEM) 60 MG tablet Take 1 tablet (60 mg total) by mouth 2 (two) times daily as needed (for HR >110). 07/03/19 07/02/20  Gladstone Lighter, MD  divalproex (DEPAKOTE SPRINKLE) 125 MG capsule Take 375 mg by mouth 2 (two) times daily.    [provider]  finasteride (PROSCAR) 5 MG tablet Take 5 mg by mouth daily.    [provider]  furosemide (LASIX) 40 MG tablet Take 1 tablet (40 mg total) by mouth 2 (two) times daily. 03/31/19   Thurnell Lose, MD  ipratropium-albuterol (DUONEB) 0.5-2.5 (3) MG/3ML SOLN Take 3 mLs by nebulization every 6 (six) hours as needed (wheezing, shortness of breath). 07/03/19   Gladstone Lighter, MD  Multiple Vitamin (MULTIVITAMIN) tablet Take 1 tablet by mouth daily.    [provider]  potassium chloride (K-DUR) 20 MEQ tablet Take 1 tablet (20 mEq total) by mouth daily. 03/31/19   Thurnell Lose, MD  sennosides-docusate sodium (SENOKOT-S) 8.6-50 MG tablet Take 1 tablet by mouth 2 (two) times daily.    [provider]  terazosin (HYTRIN) 10 MG capsule Take 10 mg by mouth at bedtime.    [provider]  traZODone (DESYREL) 50 MG tablet Take 50 mg by mouth at bedtime.    [provider]    No Known Allergies  No family history on file.  Social History Social History   Tobacco Use  . Smoking status: Former Smoker    Packs/day: 0.50    Years: 10.00    Pack years: 5.00    Types: Cigarettes    Quit date: 11/12/2008    Years since quitting: 11.1  . Smokeless tobacco: Never Used  Substance Use Topics  . Alcohol use: Not on file  . Drug use: Not on file    Review of Systems Constitutional: Negative for fever. Cardiovascular: Negative for chest pain. Respiratory: Nursing home report shortness of breath although patient  denies. Gastrointestinal: Negative for abdominal pain Musculoskeletal: Negative for musculoskeletal complaints Neurological: Negative for headache All other ROS negative  ____________________________________________   PHYSICAL EXAM:  VITAL SIGNS: ED Triage Vitals  Enc Vitals Group     BP 01/07/20 1254 134/75     Pulse Rate 01/07/20 1254 94     Resp 01/07/20 1254 18     Temp 01/07/20 1255 98.6 F (37 C)     Temp Source 01/07/20 1254 Oral     SpO2 01/07/20 1254 94 %     Weight 01/07/20 1255 250 lb (113.4 kg)     Height 01/07/20 1255 5\' 11"  (1.803 m)     Head Circumference --      Peak Flow --      Pain Score 01/07/20 1254 0     Pain Loc --      Pain Edu? --      Excl. in GC? --    Constitutional: Alert.  Chronically ill-appearing but no acute distress. Eyes: Normal exam ENT      Head: Normocephalic and atraumatic.      Mouth/Throat: Mucous membranes are moist. Cardiovascular: Normal rate, regular rhythm. Respiratory: Normal respiratory effort without tachypnea nor retractions. Breath sounds are clear Gastrointestinal: Soft and nontender. No distention.   Musculoskeletal: Nontender with normal range of motion in all extremities.  Neurologic:  Normal speech and language. No gross focal neurologic deficits Skin:  Skin is warm, dry and intact.  Psychiatric: Mood and affect are normal.   ____________________________________________    EKG  EKG viewed and interpreted by myself shows atrial fibrillation at 92 bpm with a slightly widened QRS, left axis deviation largely normal intervals nonspecific ST changes without ST elevation.  ____________________________________________    RADIOLOGY  Chest x-ray shows bilateral perihilar and basilar opacities concerning for edema.  ____________________________________________   INITIAL IMPRESSION / ASSESSMENT AND PLAN / ED COURSE  Pertinent labs & imaging results that were available during my care of the patient were reviewed  by me and considered in my medical decision making (see chart for details).   Patient presents to the emergency department for reported shortness of breath found to have an O2 saturation in the 50s per nursing home staff on room air.  Patient typically wears 2 L of oxygen.  Currently satting in the upper 80s on 2 L in the emergency department, low to mid 90s on 4 L.  We will check labs, chest x-ray, corona swab and continue to closely monitor.  Patient is in no distress and has no complaints currently.  Patient's chest x-ray shows interstitial edema/pulmonary edema.  Given the patient's increased O2 requirement with now interstitial versus pulmonary edema on chest x-ray we will admit to the hospital service for IV diuresis ordered 80 mg of IV Lasix for the patient.  Patient's troponin although slightly elevated  largely unchanged from baseline.  Per patient's family patient had corona in May 2020.  DEQUAVIOUS HARSHBERGER was evaluated in Emergency Department on 01/07/2020 for the symptoms described in the history of present illness. He was evaluated in the context of the global COVID-19 pandemic, which necessitated consideration that the patient might be at risk for infection with the SARS-CoV-2 virus that causes COVID-19. Institutional protocols and algorithms that pertain to the evaluation of patients at risk for COVID-19 are in a state of rapid change based on information released by regulatory bodies including the CDC and federal and state organizations. These policies and algorithms were followed during the patient's care in the ED.  ____________________________________________   FINAL CLINICAL IMPRESSION(S) / ED DIAGNOSES  Dyspnea CHF exacerbation   Minna Antis, MD 01/07/20 1416

## 2020-01-08 ENCOUNTER — Inpatient Hospital Stay (HOSPITAL_COMMUNITY)
Admit: 2020-01-08 | Discharge: 2020-01-08 | Disposition: A | Discharge status: Home / Self Care | Payer: No Typology Code available for payment source | Attending: Internal Medicine | Admitting: Internal Medicine

## 2020-01-08 DIAGNOSIS — I5033 Acute on chronic diastolic (congestive) heart failure: Secondary | ICD-10-CM

## 2020-01-08 LAB — LIPID PANEL
Cholesterol: 124 mg/dL (ref 0–200)
HDL: 24 mg/dL — ABNORMAL LOW (ref >40)
LDL Cholesterol: 83 mg/dL (ref 0–99)
Total CHOL/HDL Ratio: 5.2 RATIO
Triglycerides: 87 mg/dL (ref <150)
VLDL: 17 mg/dL (ref 0–40)

## 2020-01-08 LAB — BASIC METABOLIC PANEL
Anion gap: 8 (ref 5–15)
BUN: 20 mg/dL (ref 8–23)
CO2: 46 mmol/L — ABNORMAL HIGH (ref 22–32)
Calcium: 8.7 mg/dL — ABNORMAL LOW (ref 8.9–10.3)
Chloride: 91 mmol/L — ABNORMAL LOW (ref 98–111)
Creatinine, Ser: 0.67 mg/dL (ref 0.61–1.24)
GFR calc Af Amer: >60 mL/min (ref >60)
GFR calc non Af Amer: >60 mL/min (ref >60)
Glucose, Bld: 96 mg/dL (ref 70–99)
Potassium: 3.4 mmol/L — ABNORMAL LOW (ref 3.5–5.1)
Sodium: 145 mmol/L (ref 135–145)

## 2020-01-08 LAB — HEMOGLOBIN A1C
Hgb A1c MFr Bld: 5.9 % — ABNORMAL HIGH (ref 4.8–5.6)
Mean Plasma Glucose: 122.63 mg/dL

## 2020-01-08 LAB — ECHOCARDIOGRAM COMPLETE
Height: 71 in
Weight: 3540.8 oz

## 2020-01-08 LAB — MAGNESIUM: Magnesium: 2.1 mg/dL (ref 1.7–2.4)

## 2020-01-08 MED ORDER — IPRATROPIUM-ALBUTEROL 0.5-2.5 (3) MG/3ML IN SOLN
3.0000 mL | Freq: Two times a day (BID) | RESPIRATORY_TRACT | Status: DC
  Administered 2020-01-08 – 2020-01-09 (×2): 3 mL via RESPIRATORY_TRACT
  Filled 2020-01-08 (×2): qty 3

## 2020-01-08 MED ORDER — PERFLUTREN LIPID MICROSPHERE
1.0000 mL | INTRAVENOUS | Status: AC | PRN
  Administered 2020-01-08: 09:00:00 3 mL via INTRAVENOUS
  Filled 2020-01-08: qty 10

## 2020-01-08 MED ORDER — POTASSIUM CHLORIDE CRYS ER 20 MEQ PO TBCR
60.0000 meq | EXTENDED_RELEASE_TABLET | Freq: Once | ORAL | Status: AC
  Administered 2020-01-08: 12:00:00 60 meq via ORAL
  Filled 2020-01-08: qty 3

## 2020-01-08 NOTE — Plan of Care (Signed)
Pt admitted earlier this shift, alert name only. Provided education on CHF, pt does not understand at this time, will reinforce education as needed.

## 2020-01-08 NOTE — Progress Notes (Signed)
PROGRESS NOTE    Dwayne Bridges  BCW:[id number] DOB: June 18, 1951 DOA: 01/07/2020 PCP: Patient, [name] Per  Brief Narrative:  HPI: Dwayne Bridges is a 69 y.o. male with medical history significant of hypertension, hyperlipidemia, GERD, anxiety, dCHF, bipolar disorder, COVID-19 infection May/2020, atrial fibrillation on Eliquis, who presents with shortness breath and confusion.  Pt is from SNF. Pt was noted to have SOB in the facility. He had oxygen desaturation to 50s% per report.  Currently 94% on 4 L nasal cannula oxygen.  Patient denies any chest pain.  No active cough.  No active nausea, vomiting, diarrhea noted.  Does not seem to have abdominal pain.  Patient is mildly confused.  Per her niece (I called her niece by phone), patient normally is alert, orientated x3.  Currently patient knows his own name, is not oriented to time and place.  2/26: Patient seen and examined.  Titrated to 3 L nasal cannula.  Unclear if patient has any baseline oxygen requirement but no documentation indicates though.  Patient baseline mental status is also unclear.  Per the facility the patient is baseline confused however according to admitting physician patient's niece noticed him to be oriented x3.  Currently patient knows his name and that he is in the hospital but unable to provide further details.   Assessment & Plan:   Principal Problem:   Acute on chronic diastolic CHF (congestive heart failure) (HCC) Active Problems:   Acute on chronic respiratory failure with hypoxia (HCC)   HTN (hypertension)   HLD (hyperlipidemia)   Atrial fibrillation, chronic (HCC)   Bipolar disorder, unspecified (HCC)   Elevated troponin   Acute metabolic encephalopathy  Acute on chronic respiratory failure with hypoxia  acute on chronic diastolic CHF Patient has 1+ leg edema, bilateral pulmonary edema on chest x-ray, elevated BNP,  Pt had 2D echo on 06/30/2019, which showed:  "the left ventricle was not well visualized.  The cavity size was normal. Left ventricular diastolic parameters were normal. No evidence of left ventricular regional wall motion abnormalities".  Plan: - Continue Lasix 60 mg bid by IV (patient received 80 mg of Lasix in ED) -2d echo: completed, pending read -Daily weights -strict I/O's -Low salt diet -Fluid restriction -Continue lisinopril 2.5 mg daily  HTN:  -Continue home medications: Cardizem -Newly added lisinopril -hydralazine prn  HLD (hyperlipidemia) -lipitor  Atrial fibrillation, chronic (HCC): -Continue Cardizem and Eliquis  Bipolar disorder, unspecified (HCC): -Depakote  Elevated troponin:  Troponin I 85.  No chest pain.   Possibly due to demand ischemia secondary to CHF exacerbation -Continue Lipitor -Trend troponin  Acute metabolic encephalopathy:  Etiology is not clear.  May be due to hypoxia. Baseline mentation also unclear -Follow-up CT head: negative -Frequent neuro check -Frequent reorientation   DVT prophylaxis: Eliquis Code Status: DNR Family Communication: None today Disposition Plan: Back to previous living facility.  Anticipate 48 hours or when back to dry weight  Consultants:   None  Procedures:   None  Antimicrobials:   None   Subjective: Seen and examined Sleeping but easily awakened Oriented x2 Answers questions appropriately  Objective: Vitals:   01/07/20 2033 01/08/20 0524 01/08/20 0809 01/08/20 0833  BP: 132/83 110/70  127/87  Pulse: 62 88 95 94  Resp: 15 14 16    Temp: 98.5 F (36.9 C) 98.1 F (36.7 C)  98.2 F (36.8 C)  TempSrc: Oral Oral  Oral  SpO2: 94% 92% 95% 95%  Weight:  100.4 kg    Height:  Intake/Output Summary (Last 24 hours) at 01/08/2020 1112 Last data filed at 01/08/2020 0600 Gross per 24 hour  Intake 120 ml  Output 730 ml  Net -610 ml   Filed Weights   01/07/20 1255 01/08/20 0524  Weight: 113.4 kg 100.4 kg    Examination:  General exam: Appears calm and comfortable   Respiratory system: Bibasilar crackles, normal work of breathing cardiovascular system: S1-S2 heard, 3/6 systolic murmur, regular rhythm, 2+ pitting edema bilaterally  gastrointestinal system: Obese, nondistended, soft and nontender. No organomegaly or masses felt. Normal bowel sounds heard. Central nervous system: Oriented x2, no focal deficits Extremities: Symmetric 5 x 5 power. Skin: No rashes, lesions or ulcers Psychiatry: Mildly confused    Data Reviewed: I have personally reviewed following labs and imaging studies  CBC: Recent Labs  Lab 01/07/20 1259  WBC 6.7  HGB 12.1*  HCT 39.6  MCV 96.1  PLT 140*   Basic Metabolic Panel: Recent Labs  Lab 01/07/20 1259 01/08/20 0438  NA 143 145  K 4.7 3.4*  CL 91* 91*  CO2 42* 46*  GLUCOSE 141* 96  BUN 21 20  CREATININE 0.66 0.67  CALCIUM 8.9 8.7*  MG  --  2.1   GFR: Estimated Creatinine Clearance: 103.7 mL/min (by C-G formula based on SCr of 0.67 mg/dL). Liver Function Tests: Recent Labs  Lab 01/07/20 1259  AST 48*  ALT 24  ALKPHOS 56  BILITOT 1.4*  PROT 7.4  ALBUMIN 2.7*   No results for input(s): LIPASE, AMYLASE in the last 168 hours. No results for input(s): AMMONIA in the last 168 hours. Coagulation Profile: No results for input(s): INR, PROTIME in the last 168 hours. Cardiac Enzymes: No results for input(s): CKTOTAL, CKMB, CKMBINDEX, TROPONINI in the last 168 hours. BNP (last 3 results) No results for input(s): PROBNP in the last 8760 hours. HbA1C: Recent Labs    01/08/20 0438  HGBA1C 5.9*   CBG: No results for input(s): GLUCAP in the last 168 hours. Lipid Profile: Recent Labs    01/08/20 0438  CHOL 124  HDL 24*  LDLCALC 83  TRIG 87  CHOLHDL 5.2   Thyroid Function Tests: No results for input(s): TSH, T4TOTAL, FREET4, T3FREE, THYROIDAB in the last 72 hours. Anemia Panel: No results for input(s): VITAMINB12, FOLATE, FERRITIN, TIBC, IRON, RETICCTPCT in the last 72 hours. Sepsis Labs: No  results for input(s): PROCALCITON, LATICACIDVEN in the last 168 hours.  Recent Results (from the past 240 hour(s))  SARS CORONAVIRUS 2 (TAT 6-24 HRS) Nasopharyngeal Nasopharyngeal Swab     Status: None   Collection Time: 01/07/20  3:07 PM   Specimen: Nasopharyngeal Swab  Result Value Ref Range Status   SARS Coronavirus 2 NEGATIVE NEGATIVE Final    Comment: (NOTE) SARS-CoV-2 target nucleic acids are NOT DETECTED. The SARS-CoV-2 RNA is generally detectable in upper and lower respiratory specimens during the acute phase of infection. Negative results do not preclude SARS-CoV-2 infection, do not rule out co-infections with other pathogens, and should not be used as the sole basis for treatment or other patient management decisions. Negative results must be combined with clinical observations, patient history, and epidemiological information. The expected result is Negative. Fact Sheet for Patients: HairSlick.no Fact Sheet for Healthcare Providers: quierodirigir.com This test is not yet approved or cleared by the [place] FDA and  has been authorized for detection and/or diagnosis of SARS-CoV-2 by FDA under an Emergency Use Authorization (EUA). This EUA will remain  in effect (meaning this test can be  used) for the duration of the COVID-19 declaration under Section 56 4(b)(1) of the Act, 21 U.S.C. section 360bbb-3(b)(1), unless the authorization is terminated or revoked sooner. Performed at [facility], [place] [address], [place], [place] [address]   MRSA PCR Screening     Status: None   Collection Time: 01/07/20  5:52 PM   Specimen: Nasal Mucosa; Nasopharyngeal  Result Value Ref Range Status   MRSA by PCR NEGATIVE NEGATIVE Final    Comment:        The GeneXpert MRSA Assay (FDA approved for NASAL specimens only), is one component of a comprehensive MRSA colonization surveillance program. It is not intended to diagnose  MRSA infection nor to guide or monitor treatment for MRSA infections. Performed at [facility], [address], [place], [place] [address]          Radiology Studies: CT HEAD WO CONTRAST  Result Date: 01/07/2020 CLINICAL DATA:  Oxygen desaturations, encephalopathy confusion EXAM: CT HEAD WITHOUT CONTRAST TECHNIQUE: Contiguous axial images were obtained from the base of the skull through the vertex without intravenous contrast. COMPARISON:  06/30/2019 FINDINGS: Brain: Chronic encephalomalacia within the bilateral occipital lobes, right greater than left. Ex vacuo dilatation of the occipital horn of the right lateral ventricle unchanged. There are chronic small vessel ischemic changes within the bilateral basal ganglia, right greater than left. No signs of acute infarct or hemorrhage. Remaining midline structures are unremarkable. No acute extra-axial fluid collections. No mass effect. Vascular: No hyperdense vessel or unexpected calcification. Skull: Normal. Negative for fracture or focal lesion. Sinuses/Orbits: Previous bilateral partial ethmoidectomies. Mild mucosal thickening throughout the ethmoid air cells. Remaining sinuses are clear. Other: None IMPRESSION: 1. Stable head CT, no acute intracranial process. Electronically Signed   By: Randa Ngo M.D.   On: 01/07/2020 19:20   DG Chest Portable 1 View  Result Date: 01/07/2020 CLINICAL DATA:  Shortness of breath. EXAM: PORTABLE CHEST 1 VIEW COMPARISON:  July 01, 2019. FINDINGS: Stable cardiomegaly. No pneumothorax is noted. New bilateral perihilar and basilar opacities are noted concerning for edema or possibly inflammation. Small right pleural effusion is noted. Bony thorax is unremarkable. IMPRESSION: New bilateral perihilar and basilar opacities are noted concerning for edema or possibly inflammation. Small right pleural effusion is noted. Electronically Signed   By: Marijo Conception M.D.   On: 01/07/2020 13:39         Scheduled Meds: . apixaban  5 mg Oral BID  . atorvastatin  20 mg Oral QHS  . dextromethorphan-guaiFENesin  1 tablet Oral BID  . diltiazem  180 mg Oral Daily  . divalproex  375 mg Oral BID  . finasteride  5 mg Oral Daily  . furosemide  60 mg Intravenous Q12H  . ipratropium-albuterol  3 mL Nebulization BID  . lisinopril  2.5 mg Oral Daily  . multivitamin with minerals  1 tablet Oral Daily  . potassium chloride  60 mEq Oral Once  . senna-docusate  1 tablet Oral BID  . sodium chloride flush  3 mL Intravenous Q12H  . terazosin  10 mg Oral QHS   Continuous Infusions: . sodium chloride       LOS: 1 day    Time spent: 35 minutes    Sidney Ace, MD [facility] Pager [phone]  If 7PM-7AM, please contact night-coverage 01/08/2020, 11:12 AM

## 2020-01-08 NOTE — Progress Notes (Signed)
Patient not given any night time medication. Attempted to complete administration however patient would not swallow pills. Patient fully awake but would not swallow pills. Attempted to get patient to drink water. Patient holding water in mouth as well.

## 2020-01-08 NOTE — Progress Notes (Deleted)
Received report from [place] RN.

## 2020-01-08 NOTE — Progress Notes (Signed)
*  PRELIMINARY RESULTS* Echocardiogram 2D Echocardiogram has been performed.  Dwayne Bridges [name] 01/08/2020, 9:13 AM

## 2020-01-08 NOTE — Progress Notes (Signed)
Pharmacy Electrolyte Monitoring Consult:  Pharmacy consulted to assist in monitoring and replacing electrolytes in this 69 y.o. male admitted on 01/07/2020 with Shortness of Breath   Labs:  Sodium (mmol/L)  Date Value  01/08/2020 145  12/05/2013 142   Potassium (mmol/L)  Date Value  01/08/2020 3.4 (L)  12/05/2013 3.1 (L)   Magnesium (mg/dL)  Date Value  16/42/9037 2.1  12/05/2013 1.8   Calcium (mg/dL)  Date Value  95/58/3167 8.7 (L)   Calcium, Total (mg/dL)  Date Value  42/55/2589 8.4 (L)   Albumin (g/dL)  Date Value  48/34/7583 2.7 (L)  12/01/2013 2.7 (L)    Assessment:  Pt here with CHF exacerbation started on IV lasix 60 mg IV q12h K 3.4, Mag 2.1  Goal:  K>4, Mag >2 per consult   Plan: MD has already ordered KCl 60 mEq PO x1  Will follow up with AM labs.   Pharmacy will continue to follow.   Marty Heck 01/08/2020 8:40 AM

## 2020-01-09 LAB — BASIC METABOLIC PANEL
Anion gap: 9 (ref 5–15)
BUN: 16 mg/dL (ref 8–23)
CO2: 43 mmol/L — ABNORMAL HIGH (ref 22–32)
Calcium: 8.7 mg/dL — ABNORMAL LOW (ref 8.9–10.3)
Chloride: 92 mmol/L — ABNORMAL LOW (ref 98–111)
Creatinine, Ser: 0.53 mg/dL — ABNORMAL LOW (ref 0.61–1.24)
GFR calc Af Amer: >60 mL/min (ref >60)
GFR calc non Af Amer: >60 mL/min (ref >60)
Glucose, Bld: 88 mg/dL (ref 70–99)
Potassium: 3.6 mmol/L (ref 3.5–5.1)
Sodium: 144 mmol/L (ref 135–145)

## 2020-01-09 LAB — MAGNESIUM: Magnesium: 2 mg/dL (ref 1.7–2.4)

## 2020-01-09 MED ORDER — IPRATROPIUM-ALBUTEROL 0.5-2.5 (3) MG/3ML IN SOLN
3.0000 mL | Freq: Three times a day (TID) | RESPIRATORY_TRACT | Status: DC
  Administered 2020-01-09 – 2020-01-12 (×10): 3 mL via RESPIRATORY_TRACT
  Filled 2020-01-09 (×10): qty 3

## 2020-01-09 MED ORDER — POTASSIUM CHLORIDE CRYS ER 20 MEQ PO TBCR
40.0000 meq | EXTENDED_RELEASE_TABLET | Freq: Once | ORAL | Status: AC
  Administered 2020-01-09: 09:00:00 40 meq via ORAL
  Filled 2020-01-09: qty 2

## 2020-01-09 MED ORDER — FUROSEMIDE 10 MG/ML IJ SOLN
80.0000 mg | Freq: Two times a day (BID) | INTRAMUSCULAR | Status: DC
  Administered 2020-01-09 – 2020-01-12 (×6): 80 mg via INTRAVENOUS
  Filled 2020-01-09 (×6): qty 8

## 2020-01-09 NOTE — Evaluation (Signed)
Physical Therapy Evaluation Patient Details Name: Dwayne Bridges MRN: [id number] DOB: 1951/10/10 Today's Date: 01/09/2020   History of Present Illness  Dwayne Bridges is a 69 y.o. male with medical history significant of hypertension, hyperlipidemia, GERD, anxiety, dCHF, bipolar disorder, COVID-19 infection May/2020, atrial fibrillation on Eliquis, who presents with shortness breath and confusion. Patient resides at [facility] and is WC bound at baseline, with hoyer lift use for transfers, and is on chronic oxygen.  Clinical Impression  Patient is able to comply with cuing for hand placement and produce effort to aid in bed mobility and sidelying <> sit transfers but is ultilimately maxA for all. Once sitting EOB patient is fatigued and immediately needs to return to sidelying, with maxA needed to remain sitting. D/t patient utilizing hoyer lift and being WC bound at baseline for mobility, PT is not appropriate at this time, as patient mobilization needs can be met with nursing. PT will re-evaluate as needed.     Follow Up Recommendations No PT follow up    Equipment Recommendations       Recommendations for Other Services       Precautions / Restrictions Precautions Precautions: None Restrictions Weight Bearing Restrictions: No      Mobility  Bed Mobility Overal bed mobility: Needs Assistance Bed Mobility: Rolling;Sidelying to Sit;Sit to Supine Rolling: Max assist Sidelying to sit: Max assist;HOB elevated   Sit to supine: Max assist;HOB elevated   General bed mobility comments: Patient is able to participate and attempt to aid but is ultimately maxA  Transfers                 General transfer comment: Not safe to attempt further transfers  Ambulation/Gait             General Gait Details: Not safe to attempt  Stairs            Wheelchair Mobility    Modified Rankin (Stroke Patients Only)       Balance Overall balance assessment: Needs  assistance   Sitting balance-Leahy Scale: Zero Sitting balance - Comments: Needs max assist to sid unsupported                                     Pertinent Vitals/Pain Pain Assessment: No/denies pain    Home Living Family/patient expects to be discharged to:: Skilled nursing facility                      Prior Function Level of Independence: Needs assistance   Gait / Transfers Assistance Needed: Per patient and nurse: hoyer lift for transfers and WC bound at baseline, though patient reports he can propel his own WC  ADL's / Homemaking Assistance Needed: Staff assists with all ADLs        Hand Dominance   Dominant Hand: Right    Extremity/Trunk Assessment   Upper Extremity Assessment Upper Extremity Assessment: Generalized weakness    Lower Extremity Assessment Lower Extremity Assessment: Generalized weakness       Communication   Communication: No difficulties  Cognition Arousal/Alertness: Awake/alert Behavior During Therapy: WFL for tasks assessed/performed Overall Cognitive Status: Within Functional Limits for tasks assessed  General Comments      Exercises Other Exercises Other Exercises: AAROM hip flex, ext, knee flex/ext with PT producing majority of effort and pt unable to hold these positions Other Exercises: Attempted supine> sidelying > sit with patient able to participate with use of bedrails but is ultimately maxA for all transfers, including scooting in bed. Is able to comply with cuing for hand placement for attempted aid in transfer   Assessment/Plan    PT Assessment All further PT needs can be met in the next venue of care  PT Problem List Decreased strength;Decreased mobility;Decreased range of motion;Decreased balance;Decreased activity tolerance;Decreased coordination       PT Treatment Interventions      PT Goals (Current goals can be found in the Care Plan  section)  Acute Rehab PT Goals Patient Stated Goal: return to SNF PT Goal Formulation: With patient Time For Goal Achievement: 01/22/20 Potential to Achieve Goals: Fair    Frequency     Barriers to discharge        Co-evaluation               AM-PAC PT "6 Clicks" Mobility  Outcome Measure Help needed turning from your back to your side while in a flat bed without using bedrails?: Total Help needed moving from lying on your back to sitting on the side of a flat bed without using bedrails?: Total Help needed moving to and from a bed to a chair (including a wheelchair)?: Total Help needed standing up from a chair using your arms (e.g., wheelchair or bedside chair)?: Total Help needed to walk in hospital room?: Total Help needed climbing 3-5 steps with a railing? : Total 6 Click Score: 6    End of Session   Activity Tolerance: Patient limited by fatigue;Treatment limited secondary to medical complications (Comment) Patient left: in bed;with bed alarm set;with call bell/phone within reach Nurse Communication: Mobility status;Need for lift equipment PT Visit Diagnosis: Muscle weakness (generalized) (M62.81)    Time: 1140-1156 PT Time Calculation (min) (ACUTE ONLY): 16 min   Charges:   PT Evaluation $PT Eval Moderate Complexity: 1 Mod PT Treatments $Therapeutic Activity: 8-22 mins        Shelton Silvas PT, DPT Shelton Silvas 01/09/2020, 1:19 PM

## 2020-01-09 NOTE — Progress Notes (Signed)
Pharmacy Electrolyte Monitoring Consult:  Pharmacy consulted to assist in monitoring and replacing electrolytes in this 69 y.o. male admitted on 01/07/2020 with Shortness of Breath   Labs:  Sodium (mmol/L)  Date Value  01/09/2020 144  12/05/2013 142   Potassium (mmol/L)  Date Value  01/09/2020 3.6  12/05/2013 3.1 (L)   Magnesium (mg/dL)  Date Value  71/16/5790 2.0  12/05/2013 1.8   Calcium (mg/dL)  Date Value  38/33/3832 8.7 (L)   Calcium, Total (mg/dL)  Date Value  91/91/6606 8.4 (L)   Albumin (g/dL)  Date Value  00/45/9977 2.7 (L)  12/01/2013 2.7 (L)    Assessment:  Pt here with CHF exacerbation started on IV lasix 60 mg IV q12h K 3.4, Mag 2.1  Goal:  K>4, Mag >2 per consult   Plan: Will give KCl 40 mEq PO x1  Will follow up with AM labs.   Pharmacy will continue to follow.   Albina Billet, PharmD, BCPS Clinical Pharmacist 01/09/2020 7:12 AM

## 2020-01-09 NOTE — Progress Notes (Signed)
PROGRESS NOTE    MUZAMIL HARKER  JOA:[id number] DOB: 07/05/1951 DOA: 01/07/2020 PCP: Patient, [name] Per  Brief Narrative:  HPI: NIJEL FLINK is a 69 y.o. male with medical history significant of hypertension, hyperlipidemia, GERD, anxiety, dCHF, bipolar disorder, COVID-19 infection May/2020, atrial fibrillation on Eliquis, who presents with shortness breath and confusion.  Pt is from SNF. Pt was noted to have SOB in the facility. He had oxygen desaturation to 50s% per report.  Currently 94% on 4 L nasal cannula oxygen.  Patient denies any chest pain.  No active cough.  No active nausea, vomiting, diarrhea noted.  Does not seem to have abdominal pain.  Patient is mildly confused.  Per her niece (I called her niece by phone), patient normally is alert, orientated x3.  Currently patient knows his own name, is not oriented to time and place.  2/26: Patient seen and examined.  Titrated to 3 L nasal cannula.  Unclear if patient has any baseline oxygen requirement but no documentation indicates though.  Patient baseline mental status is also unclear.  Per the facility the patient is baseline confused however according to admitting physician patient's niece noticed him to be oriented x3.  Currently patient knows his name and that he is in the hospital but unable to provide further details.  2/27: Patient seen and examined.  Remains on 3 to 4 L nasal cannula.  Per bedside nurse patient might have a 2 L baseline oxygen requirement.  Still not at baseline.  Patient appears somewhat lethargic however is alert and oriented x3 this morning.  Net -1.6 L since admission.  Baseline weight is unclear.  Current weights are unreliable.   Assessment & Plan:   Principal Problem:   Acute on chronic diastolic CHF (congestive heart failure) (HCC) Active Problems:   Acute on chronic respiratory failure with hypoxia (HCC)   HTN (hypertension)   HLD (hyperlipidemia)   Atrial fibrillation, chronic (HCC)   Bipolar  disorder, unspecified (HCC)   Elevated troponin   Acute metabolic encephalopathy  Acute on chronic respiratory failure with hypoxia  acute on chronic diastolic CHF Patient has 1+ leg edema, bilateral pulmonary edema on chest x-ray, elevated BNP,  Pt had 2D echo on 06/30/2019, which showed:  "the left ventricle was not well visualized. The cavity size was normal. Left ventricular diastolic parameters were normal. No evidence of left ventricular regional wall motion abnormalities".  Plan: -Increase Lasix to 80 mg IV twice daily -2d echo: EF 60 to 65%, no new wall motion abnormalities -Daily weights -strict I/O's -Low salt diet -Fluid restriction -Continue lisinopril 2.5 mg daily  HTN:  -Continue home medications: Cardizem -Newly added lisinopril -hydralazine prn -Consider adding beta-blocker as outpatient.  Will defer now given acute decompensation  HLD (hyperlipidemia) -lipitor  Atrial fibrillation, chronic (HCC): -Continue Cardizem and Eliquis  Bipolar disorder, unspecified (HCC): -Depakote  Elevated troponin:  Troponin I 85.  No chest pain.   Possibly due to demand ischemia secondary to CHF exacerbation -Continue Lipitor -Trend troponin  Acute metabolic encephalopathy:  Etiology is not clear.  May be due to hypoxia. Baseline mentation also unclear -Follow-up CT head: negative -Frequent neuro check -Frequent reorientation   DVT prophylaxis: Eliquis Code Status: DNR Family Communication: None today Disposition Plan: Back to previous living facility.  Anticipate 48 hours or when back to dry weight  Consultants:   None  Procedures:   None  Antimicrobials:   None   Subjective: Seen and examined Sleeping but easily awakened Oriented  x3 Answers questions appropriately  Objective: Vitals:   01/08/20 1951 01/09/20 0443 01/09/20 0706 01/09/20 0729  BP:  107/71  (!) 146/112  Pulse:  75 81 75  Resp:  20 20 18   Temp:  98 F (36.7 C)  98.2 F  (36.8 C)  TempSrc:  Oral    SpO2: 95% 95% 93% 94%  Weight:  100.7 kg    Height:        Intake/Output Summary (Last 24 hours) at 01/09/2020 1102 Last data filed at 01/09/2020 1005 Gross per 24 hour  Intake 240 ml  Output 1250 ml  Net -1010 ml   Filed Weights   01/07/20 1255 01/08/20 0524 01/09/20 0443  Weight: 113.4 kg 100.4 kg 100.7 kg    Examination:  General exam: Appears calm and comfortable  Respiratory system: Bibasilar crackles, normal work of breathing cardiovascular system: S1-S2 heard, 3/6 systolic murmur, regular rhythm, 2+ pitting edema bilaterally  gastrointestinal system: Obese, nondistended, soft and nontender. No organomegaly or masses felt. Normal bowel sounds heard. Central nervous system: Oriented x2, no focal deficits Extremities: Symmetric 5 x 5 power. Skin: No rashes, lesions or ulcers Psychiatry: Mildly confused    Data Reviewed: I have personally reviewed following labs and imaging studies  CBC: Recent Labs  Lab 01/07/20 1259  WBC 6.7  HGB 12.1*  HCT 39.6  MCV 96.1  PLT 140*   Basic Metabolic Panel: Recent Labs  Lab 01/07/20 1259 01/08/20 0438 01/09/20 0438  NA 143 145 144  K 4.7 3.4* 3.6  CL 91* 91* 92*  CO2 42* 46* 43*  GLUCOSE 141* 96 88  BUN 21 20 16   CREATININE 0.66 0.67 0.53*  CALCIUM 8.9 8.7* 8.7*  MG  --  2.1 2.0   GFR: Estimated Creatinine Clearance: 103.9 mL/min (A) (by C-G formula based on SCr of 0.53 mg/dL (L)). Liver Function Tests: Recent Labs  Lab 01/07/20 1259  AST 48*  ALT 24  ALKPHOS 56  BILITOT 1.4*  PROT 7.4  ALBUMIN 2.7*   No results for input(s): LIPASE, AMYLASE in the last 168 hours. No results for input(s): AMMONIA in the last 168 hours. Coagulation Profile: No results for input(s): INR, PROTIME in the last 168 hours. Cardiac Enzymes: No results for input(s): CKTOTAL, CKMB, CKMBINDEX, TROPONINI in the last 168 hours. BNP (last 3 results) No results for input(s): PROBNP in the last 8760  hours. HbA1C: Recent Labs    01/08/20 0438  HGBA1C 5.9*   CBG: No results for input(s): GLUCAP in the last 168 hours. Lipid Profile: Recent Labs    01/08/20 0438  CHOL 124  HDL 24*  LDLCALC 83  TRIG 87  CHOLHDL 5.2   Thyroid Function Tests: No results for input(s): TSH, T4TOTAL, FREET4, T3FREE, THYROIDAB in the last 72 hours. Anemia Panel: No results for input(s): VITAMINB12, FOLATE, FERRITIN, TIBC, IRON, RETICCTPCT in the last 72 hours. Sepsis Labs: No results for input(s): PROCALCITON, LATICACIDVEN in the last 168 hours.  Recent Results (from the past 240 hour(s))  SARS CORONAVIRUS 2 (TAT 6-24 HRS) Nasopharyngeal Nasopharyngeal Swab     Status: None   Collection Time: 01/07/20  3:07 PM   Specimen: Nasopharyngeal Swab  Result Value Ref Range Status   SARS Coronavirus 2 NEGATIVE NEGATIVE Final    Comment: (NOTE) SARS-CoV-2 target nucleic acids are NOT DETECTED. The SARS-CoV-2 RNA is generally detectable in upper and lower respiratory specimens during the acute phase of infection. Negative results do not preclude SARS-CoV-2 infection, do not rule out  co-infections with other pathogens, and should not be used as the sole basis for treatment or other patient management decisions. Negative results must be combined with clinical observations, patient history, and epidemiological information. The expected result is Negative. Fact Sheet for Patients: HairSlick.no Fact Sheet for Healthcare Providers: quierodirigir.com This test is not yet approved or cleared by the [place] FDA and  has been authorized for detection and/or diagnosis of SARS-CoV-2 by FDA under an Emergency Use Authorization (EUA). This EUA will remain  in effect (meaning this test can be used) for the duration of the COVID-19 declaration under Section 56 4(b)(1) of the Act, 21 U.S.C. section 360bbb-3(b)(1), unless the authorization is terminated  or revoked sooner. Performed at [facility], [address] [place], [place] [address]   MRSA PCR Screening     Status: None   Collection Time: 01/07/20  5:52 PM   Specimen: Nasal Mucosa; Nasopharyngeal  Result Value Ref Range Status   MRSA by PCR NEGATIVE NEGATIVE Final    Comment:        The GeneXpert MRSA Assay (FDA approved for NASAL specimens only), is one component of a comprehensive MRSA colonization surveillance program. It is not intended to diagnose MRSA infection nor to guide or monitor treatment for MRSA infections. Performed at [facility], [address] [place], [place] [address]          Radiology Studies: CT HEAD WO CONTRAST  Result Date: 01/07/2020 CLINICAL DATA:  Oxygen desaturations, encephalopathy confusion EXAM: CT HEAD WITHOUT CONTRAST TECHNIQUE: Contiguous axial images were obtained from the base of the skull through the vertex without intravenous contrast. COMPARISON:  06/30/2019 FINDINGS: Brain: Chronic encephalomalacia within the bilateral occipital lobes, right greater than left. Ex vacuo dilatation of the occipital horn of the right lateral ventricle unchanged. There are chronic small vessel ischemic changes within the bilateral basal ganglia, right greater than left. No signs of acute infarct or hemorrhage. Remaining midline structures are unremarkable. No acute extra-axial fluid collections. No mass effect. Vascular: No hyperdense vessel or unexpected calcification. Skull: Normal. Negative for fracture or focal lesion. Sinuses/Orbits: Previous bilateral partial ethmoidectomies. Mild mucosal thickening throughout the ethmoid air cells. Remaining sinuses are clear. Other: None IMPRESSION: 1. Stable head CT, no acute intracranial process. Electronically Signed   By: Sharlet Salina M.D.   On: 01/07/2020 19:20   DG Chest Portable 1 View  Result Date: 01/07/2020 CLINICAL DATA:  Shortness of breath. EXAM: PORTABLE CHEST 1 VIEW COMPARISON:   July 01, 2019. FINDINGS: Stable cardiomegaly. No pneumothorax is noted. New bilateral perihilar and basilar opacities are noted concerning for edema or possibly inflammation. Small right pleural effusion is noted. Bony thorax is unremarkable. IMPRESSION: New bilateral perihilar and basilar opacities are noted concerning for edema or possibly inflammation. Small right pleural effusion is noted. Electronically Signed   By: Lupita Raider M.D.   On: 01/07/2020 13:39   ECHOCARDIOGRAM COMPLETE  Result Date: 01/08/2020    ECHOCARDIOGRAM REPORT   Patient Name:   Dwayne Bridges Date of Exam: 01/08/2020 Medical Rec #:  [id number]      Height:       71.0 in Accession #:    [id number]     Weight:       221.3 lb Date of Birth:  18-Nov-1950      BSA:          2.201 m Patient Age:    68 years       BP:  110/70 mmHg Patient Gender: M              HR:           87 bpm. Exam Location:  [facility] Procedure: 2D Echo, Color Doppler, Cardiac Doppler and Intracardiac            Opacification Agent Indications:     I50.31 CHF-Acute Diastolic  History:         Patient has prior history of Echocardiogram examinations. CHF;                  Risk Factors:Sleep Apnea and Hypertension.  Sonographer:     Humphrey Rolls RDCS (AE) Referring Phys:  [id number] Brien Few NIU Diagnosing Phys: Julien Nordmann MD  Sonographer Comments: Suboptimal apical window. IMPRESSIONS  1. Left ventricular ejection fraction, by estimation, is 60 to 65%. The left ventricle has normal function. The left ventricle has no regional wall motion abnormalities. There is mild left ventricular hypertrophy. Left ventricular diastolic parameters are indeterminate.  2. Right ventricular systolic function is normal. The right ventricular size is normal. Tricuspid regurgitation signal is inadequate for assessing PA pressure.  3. Left atrial size was severely dilated.  4. Mild mitral stenosis. The mean mitral valve gradient is 7.0 mmHg.  5. The aortic valve is heavily calcified. Moderate  aortic valve stenosis.  6. There is mild dilatation of the aortic root. FINDINGS  Left Ventricle: Left ventricular ejection fraction, by estimation, is 60 to 65%. The left ventricle has normal function. The left ventricle has no regional wall motion abnormalities. Definity contrast agent was given IV to delineate the left ventricular  endocardial borders. The left ventricular internal cavity size was normal in size. There is mild left ventricular hypertrophy. Left ventricular diastolic parameters are indeterminate. Right Ventricle: The right ventricular size is normal. No increase in right ventricular wall thickness. Right ventricular systolic function is normal. Tricuspid regurgitation signal is inadequate for assessing PA pressure. Left Atrium: Left atrial size was severely dilated. Right Atrium: Right atrial size was normal in size. Pericardium: There is no evidence of pericardial effusion. Mitral Valve: The mitral valve was not well visualized. There is moderate calcification of the mitral valve leaflet(s). Normal mobility of the mitral valve leaflets. Moderate to severe mitral annular calcification. No evidence of mitral valve regurgitation. Mild mitral valve stenosis. MV peak gradient, 15.7 mmHg. The mean mitral valve gradient is 7.0 mmHg. Tricuspid Valve: The tricuspid valve is normal in structure. Tricuspid valve regurgitation is not demonstrated. No evidence of tricuspid stenosis. Aortic Valve: The aortic valve is normal in structure and function. Aortic valve regurgitation is not visualized. Moderate aortic stenosis is present. Aortic valve mean gradient measures 24.0 mmHg. Aortic valve peak gradient measures 43.2 mmHg. Aortic valve area, by VTI measures 1.37 cm. Pulmonic Valve: The pulmonic valve was normal in structure. Pulmonic valve regurgitation is not visualized. No evidence of pulmonic stenosis. Aorta: The aortic root is normal in size and structure. There is mild dilatation of the aortic root.  Venous: The inferior vena cava is normal in size with greater than 50% respiratory variability, suggesting right atrial pressure of 3 mmHg. IAS/Shunts: No atrial level shunt detected by color flow Doppler.  LEFT VENTRICLE PLAX 2D LVIDd:         4.37 cm  Diastology LVIDs:         3.29 cm  LV e' lateral:   7.72 cm/s LV PW:         1.18 cm  LV E/e'  lateral: 24.7 LV IVS:        1.04 cm  LV e' medial:    7.72 cm/s LVOT diam:     2.30 cm  LV E/e' medial:  24.7 LV SV:         77 LV SV Index:   35 LVOT Area:     4.15 cm  RIGHT VENTRICLE RV Basal diam:  3.35 cm LEFT ATRIUM            Index       RIGHT ATRIUM           Index LA diam:      5.50 cm  2.50 cm/m  RA Area:     15.70 cm LA Vol (A4C): 116.0 ml 52.70 ml/m RA Volume:   37.00 ml  16.81 ml/m  AORTIC VALVE                    PULMONIC VALVE AV Area (Vmax):    1.35 cm     PV Vmax:       1.13 m/s AV Area (Vmean):   1.37 cm     PV Vmean:      77.400 cm/s AV Area (VTI):     1.37 cm     PV VTI:        0.190 m AV Vmax:           328.50 cm/s  PV Peak grad:  5.1 mmHg AV Vmean:          226.000 cm/s PV Mean grad:  3.0 mmHg AV VTI:            0.560 m AV Peak Grad:      43.2 mmHg AV Mean Grad:      24.0 mmHg LVOT Vmax:         107.00 cm/s LVOT Vmean:        74.600 cm/s LVOT VTI:          0.185 m LVOT/AV VTI ratio: 0.33  AORTA Ao Root diam: 3.90 cm MITRAL VALVE MV Area (PHT): 3.21 cm     SHUNTS MV Peak grad:  15.7 mmHg    Systemic VTI:  0.18 m MV Mean grad:  7.0 mmHg     Systemic Diam: 2.30 cm MV Vmax:       1.98 m/s MV Vmean:      125.0 cm/s MV Decel Time: 236 msec MV E velocity: 191.00 cm/s Ida Rogue MD Electronically signed by Ida Rogue MD Signature Date/Time: 01/08/2020/11:18:06 AM    Final         Scheduled Meds: . apixaban  5 mg Oral BID  . atorvastatin  20 mg Oral QHS  . dextromethorphan-guaiFENesin  1 tablet Oral BID  . diltiazem  180 mg Oral Daily  . divalproex  375 mg Oral BID  . finasteride  5 mg Oral Daily  . furosemide  80 mg Intravenous  Q12H  . ipratropium-albuterol  3 mL Nebulization TID  . lisinopril  2.5 mg Oral Daily  . multivitamin with minerals  1 tablet Oral Daily  . senna-docusate  1 tablet Oral BID  . sodium chloride flush  3 mL Intravenous Q12H  . terazosin  10 mg Oral QHS   Continuous Infusions: . sodium chloride       LOS: 2 days    Time spent: 35 minutes    Sidney Ace, MD [facility] Pager [phone]  If 7PM-7AM, please contact night-coverage 01/09/2020, 11:02 AM

## 2020-01-09 NOTE — Plan of Care (Signed)
  Problem: Education: Goal: Knowledge of General Education information will improve Description: Including pain rating scale, medication(s)/side effects and non-pharmacologic comfort measures Outcome: Not Progressing Note: Patient only alert to self and place, also has extensive psychiatric history complicating consistent orientation and communication efforts. Will continue to monitor neurological status for the remainder of the shift. Jari Favre [facility]

## 2020-01-10 LAB — CBC
HCT: 38.1 % — ABNORMAL LOW (ref 39.0–52.0)
Hemoglobin: 11.7 g/dL — ABNORMAL LOW (ref 13.0–17.0)
MCH: 29.6 pg (ref 26.0–34.0)
MCHC: 30.7 g/dL (ref 30.0–36.0)
MCV: 96.5 fL (ref 80.0–100.0)
Platelets: 152 10*3/uL (ref 150–400)
RBC: 3.95 MIL/uL — ABNORMAL LOW (ref 4.22–5.81)
RDW: 15 % (ref 11.5–15.5)
WBC: 5.9 10*3/uL (ref 4.0–10.5)
nRBC: 0 % (ref 0.0–0.2)

## 2020-01-10 LAB — BASIC METABOLIC PANEL
Anion gap: 8 (ref 5–15)
BUN: 13 mg/dL (ref 8–23)
CO2: 46 mmol/L — ABNORMAL HIGH (ref 22–32)
Calcium: 8.7 mg/dL — ABNORMAL LOW (ref 8.9–10.3)
Chloride: 82 mmol/L — ABNORMAL LOW (ref 98–111)
Creatinine, Ser: 0.59 mg/dL — ABNORMAL LOW (ref 0.61–1.24)
GFR calc Af Amer: >60 mL/min (ref >60)
GFR calc non Af Amer: >60 mL/min (ref >60)
Glucose, Bld: 96 mg/dL (ref 70–99)
Potassium: 3.7 mmol/L (ref 3.5–5.1)
Sodium: 136 mmol/L (ref 135–145)

## 2020-01-10 LAB — MAGNESIUM: Magnesium: 1.9 mg/dL (ref 1.7–2.4)

## 2020-01-10 MED ORDER — POTASSIUM CHLORIDE CRYS ER 20 MEQ PO TBCR
40.0000 meq | EXTENDED_RELEASE_TABLET | Freq: Once | ORAL | Status: AC
  Administered 2020-01-10: 40 meq via ORAL
  Filled 2020-01-10: qty 2

## 2020-01-10 MED ORDER — METOLAZONE 2.5 MG PO TABS
2.5000 mg | ORAL_TABLET | Freq: Every day | ORAL | Status: DC
  Administered 2020-01-10 – 2020-01-12 (×3): 2.5 mg via ORAL
  Filled 2020-01-10 (×3): qty 1

## 2020-01-10 NOTE — Progress Notes (Signed)
Pharmacy Electrolyte Monitoring Consult:  Pharmacy consulted to assist in monitoring and replacing electrolytes in this 69 y.o. male admitted on 01/07/2020 with Shortness of Breath   Labs:  Sodium (mmol/L)  Date Value  01/10/2020 136  12/05/2013 142   Potassium (mmol/L)  Date Value  01/10/2020 3.7  12/05/2013 3.1 (L)   Magnesium (mg/dL)  Date Value  14/43/1540 1.9  12/05/2013 1.8   Calcium (mg/dL)  Date Value  08/67/6195 8.7 (L)   Calcium, Total (mg/dL)  Date Value  09/32/6712 8.4 (L)   Albumin (g/dL)  Date Value  45/80/9983 2.7 (L)  12/01/2013 2.7 (L)    Assessment:  Pt here with CHF exacerbation started on IV lasix 60 mg IV q12h - increased to 80mg  q12  K 3.7, Mag 1.9  Goal:  K>4, Mag >2 per consult   Plan: Will give KCl 40 mEq PO x1  Will follow up with AM labs.   Pharmacy will continue to follow.   , PharmD, BCPS Clinical Pharmacist 01/10/2020 9:18 AM

## 2020-01-10 NOTE — Progress Notes (Signed)
PROGRESS NOTE    Dwayne Bridges  CBJ:[id number] DOB: Jul 05, 1951 DOA: 01/07/2020 PCP: Patient, [name] Per  Brief Narrative:  HPI: Dwayne Bridges is a 69 y.o. male with medical history significant of hypertension, hyperlipidemia, GERD, anxiety, dCHF, bipolar disorder, COVID-19 infection May/2020, atrial fibrillation on Eliquis, who presents with shortness breath and confusion.  Pt is from SNF. Pt was noted to have SOB in the facility. He had oxygen desaturation to 50s% per report.  Currently 94% on 4 L nasal cannula oxygen.  Patient denies any chest pain.  No active cough.  No active nausea, vomiting, diarrhea noted.  Does not seem to have abdominal pain.  Patient is mildly confused.  Per her niece (I called her niece by phone), patient normally is alert, orientated x3.  Currently patient knows his own name, is not oriented to time and place.  2/26: Patient seen and examined.  Titrated to 3 L nasal cannula.  Unclear if patient has any baseline oxygen requirement but no documentation indicates though.  Patient baseline mental status is also unclear.  Per the facility the patient is baseline confused however according to admitting physician patient's niece noticed him to be oriented x3.  Currently patient knows his name and that he is in the hospital but unable to provide further details.  2/27: Patient seen and examined.  Remains on 3 to 4 L nasal cannula.  Per bedside nurse patient might have a 2 L baseline oxygen requirement.  Still not at baseline.  Patient appears somewhat lethargic however is alert and oriented x3 this morning.  Net -1.6 L since admission.  Baseline weight is unclear.  Current weights are unreliable.  2/28: Patient seen and examined.  Remains on 4 L nasal cannula.  1.9 L net negative since admission.   Assessment & Plan:   Principal Problem:   Acute on chronic diastolic CHF (congestive heart failure) (HCC) Active Problems:   Acute on chronic respiratory failure with hypoxia  (HCC)   HTN (hypertension)   HLD (hyperlipidemia)   Atrial fibrillation, chronic (HCC)   Bipolar disorder, unspecified (HCC)   Elevated troponin   Acute metabolic encephalopathy  Acute on chronic respiratory failure with hypoxia  acute on chronic diastolic CHF Patient has 1+ leg edema, bilateral pulmonary edema on chest x-ray, elevated BNP,  Pt had 2D echo on 06/30/2019, which showed:  "the left ventricle was not well visualized. The cavity size was normal. Left ventricular diastolic parameters were normal. No evidence of left ventricular regional wall motion abnormalities".  Plan: -Continue Lasix to 80 mg IV twice daily -Add Zaroxolyn 2.5 mg daily -2d echo: EF 60 to 65%, no new wall motion abnormalities -Daily weights -strict I/O's -Low salt diet -Fluid restriction -Continue lisinopril 2.5 mg daily  HTN:  -Continue home medications: Cardizem -Newly added lisinopril -hydralazine prn -Consider adding beta-blocker as outpatient.  Will defer now given acute decompensation  HLD (hyperlipidemia) -lipitor  Atrial fibrillation, chronic ([place]): -Continue Cardizem and Eliquis  Bipolar disorder, unspecified ([place]): -Depakote  Elevated troponin:  Troponin I 85.  No chest pain.   Possibly due to demand ischemia secondary to CHF exacerbation -Continue Lipitor -Trend troponin  Acute metabolic encephalopathy:  Etiology is not clear.  May be due to hypoxia. Baseline mentation also unclear -Follow-up CT head: negative -Frequent neuro check -Frequent reorientation   DVT prophylaxis: Eliquis Code Status: DNR Family Communication: None today Disposition Plan: Back to previous living facility.  Anticipate 48 hours or when back to dry weight  Consultants:   None  Procedures:   None  Antimicrobials:   None   Subjective: Seen and examined Sleeping but easily awakened Oriented x3 Answers questions appropriately  Objective: Vitals:   01/10/20 0359 01/10/20 0500  01/10/20 0733 01/10/20 1203  BP: 109/79  101/68 107/75  Pulse: 66  78 82  Resp: 18  18 18   Temp: (!) 97.5 F (36.4 C)  98.1 F (36.7 C) 98.2 F (36.8 C)  TempSrc: Oral  Oral Oral  SpO2: 99%  99% 96%  Weight:  101.4 kg 101.5 kg   Height:        Intake/Output Summary (Last 24 hours) at 01/10/2020 1437 Last data filed at 01/10/2020 1353 Gross per 24 hour  Intake 533 ml  Output 850 ml  Net -317 ml   Filed Weights   01/09/20 0443 01/10/20 0500 01/10/20 0733  Weight: 100.7 kg 101.4 kg 101.5 kg    Examination:  General exam: Appears calm and comfortable  Respiratory system: Bibasilar crackles, normal work of breathing  cardiovascular system: S1-S2 heard, 3/6 systolic murmur, regular rhythm, 2+ pitting edema bilaterally  gastrointestinal system: Obese, nondistended, soft and nontender. No organomegaly or masses felt. Normal bowel sounds heard. Central nervous system: Oriented x2, no focal deficits Extremities: Symmetric 5 x 5 power. Skin: No rashes, lesions or ulcers Psychiatry: Mildly confused    Data Reviewed: I have personally reviewed following labs and imaging studies  CBC: Recent Labs  Lab 01/07/20 1259 01/10/20 0539  WBC 6.7 5.9  HGB 12.1* 11.7*  HCT 39.6 38.1*  MCV 96.1 96.5  PLT 140* 152   Basic Metabolic Panel: Recent Labs  Lab 01/07/20 1259 01/08/20 0438 01/09/20 0438 01/10/20 0539  NA 143 145 144 136  K 4.7 3.4* 3.6 3.7  CL 91* 91* 92* 82*  CO2 42* 46* 43* 46*  GLUCOSE 141* 96 88 96  BUN 21 20 16 13   CREATININE 0.66 0.67 0.53* 0.59*  CALCIUM 8.9 8.7* 8.7* 8.7*  MG  --  2.1 2.0 1.9   GFR: Estimated Creatinine Clearance: 104.3 mL/min (A) (by C-G formula based on SCr of 0.59 mg/dL (L)). Liver Function Tests: Recent Labs  Lab 01/07/20 1259  AST 48*  ALT 24  ALKPHOS 56  BILITOT 1.4*  PROT 7.4  ALBUMIN 2.7*   No results for input(s): LIPASE, AMYLASE in the last 168 hours. No results for input(s): AMMONIA in the last 168  hours. Coagulation Profile: No results for input(s): INR, PROTIME in the last 168 hours. Cardiac Enzymes: No results for input(s): CKTOTAL, CKMB, CKMBINDEX, TROPONINI in the last 168 hours. BNP (last 3 results) No results for input(s): PROBNP in the last 8760 hours. HbA1C: Recent Labs    01/08/20 0438  HGBA1C 5.9*   CBG: No results for input(s): GLUCAP in the last 168 hours. Lipid Profile: Recent Labs    01/08/20 0438  CHOL 124  HDL 24*  LDLCALC 83  TRIG 87  CHOLHDL 5.2   Thyroid Function Tests: No results for input(s): TSH, T4TOTAL, FREET4, T3FREE, THYROIDAB in the last 72 hours. Anemia Panel: No results for input(s): VITAMINB12, FOLATE, FERRITIN, TIBC, IRON, RETICCTPCT in the last 72 hours. Sepsis Labs: No results for input(s): PROCALCITON, LATICACIDVEN in the last 168 hours.  Recent Results (from the past 240 hour(s))  SARS CORONAVIRUS 2 (TAT 6-24 HRS) Nasopharyngeal Nasopharyngeal Swab     Status: None   Collection Time: 01/07/20  3:07 PM   Specimen: Nasopharyngeal Swab  Result Value Ref Range Status  SARS Coronavirus 2 NEGATIVE NEGATIVE Final    Comment: (NOTE) SARS-CoV-2 target nucleic acids are NOT DETECTED. The SARS-CoV-2 RNA is generally detectable in upper and lower respiratory specimens during the acute phase of infection. Negative results do not preclude SARS-CoV-2 infection, do not rule out co-infections with other pathogens, and should not be used as the sole basis for treatment or other patient management decisions. Negative results must be combined with clinical observations, patient history, and epidemiological information. The expected result is Negative. Fact Sheet for Patients: HairSlick.no Fact Sheet for Healthcare Providers: quierodirigir.com This test is not yet approved or cleared by the [place] FDA and  has been authorized for detection and/or diagnosis of SARS-CoV-2 by FDA under  an Emergency Use Authorization (EUA). This EUA will remain  in effect (meaning this test can be used) for the duration of the COVID-19 declaration under Section 56 4(b)(1) of the Act, 21 U.S.C. section 360bbb-3(b)(1), unless the authorization is terminated or revoked sooner. Performed at [facility], [address], [place], [place] [address]   MRSA PCR Screening     Status: None   Collection Time: 01/07/20  5:52 PM   Specimen: Nasal Mucosa; Nasopharyngeal  Result Value Ref Range Status   MRSA by PCR NEGATIVE NEGATIVE Final    Comment:        The GeneXpert MRSA Assay (FDA approved for NASAL specimens only), is one component of a comprehensive MRSA colonization surveillance program. It is not intended to diagnose MRSA infection nor to guide or monitor treatment for MRSA infections. Performed at [facility], [address] [place], [place] [address]          Radiology Studies: No results found.      Scheduled Meds: . apixaban  5 mg Oral BID  . atorvastatin  20 mg Oral QHS  . dextromethorphan-guaiFENesin  1 tablet Oral BID  . diltiazem  180 mg Oral Daily  . divalproex  375 mg Oral BID  . finasteride  5 mg Oral Daily  . furosemide  80 mg Intravenous Q12H  . ipratropium-albuterol  3 mL Nebulization TID  . lisinopril  2.5 mg Oral Daily  . metolazone  2.5 mg Oral Daily  . multivitamin with minerals  1 tablet Oral Daily  . senna-docusate  1 tablet Oral BID  . sodium chloride flush  3 mL Intravenous Q12H  . terazosin  10 mg Oral QHS   Continuous Infusions: . sodium chloride       LOS: 3 days    Time spent: 35 minutes    Tresa Moore, MD [facility] Pager [phone]  If 7PM-7AM, please contact night-coverage 01/10/2020, 2:37 PM

## 2020-01-11 LAB — BASIC METABOLIC PANEL
Anion gap: 13 (ref 5–15)
BUN: 12 mg/dL (ref 8–23)
CO2: 48 mmol/L — ABNORMAL HIGH (ref 22–32)
Calcium: 9.1 mg/dL (ref 8.9–10.3)
Chloride: 74 mmol/L — ABNORMAL LOW (ref 98–111)
Creatinine, Ser: 0.56 mg/dL — ABNORMAL LOW (ref 0.61–1.24)
GFR calc Af Amer: >60 mL/min (ref >60)
GFR calc non Af Amer: >60 mL/min (ref >60)
Glucose, Bld: 101 mg/dL — ABNORMAL HIGH (ref 70–99)
Potassium: 3.2 mmol/L — ABNORMAL LOW (ref 3.5–5.1)
Sodium: 135 mmol/L (ref 135–145)

## 2020-01-11 LAB — MAGNESIUM: Magnesium: 2 mg/dL (ref 1.7–2.4)

## 2020-01-11 MED ORDER — POTASSIUM CHLORIDE CRYS ER 20 MEQ PO TBCR
40.0000 meq | EXTENDED_RELEASE_TABLET | Freq: Once | ORAL | Status: AC
  Administered 2020-01-11: 40 meq via ORAL
  Filled 2020-01-11: qty 2

## 2020-01-11 NOTE — Progress Notes (Signed)
PROGRESS NOTE    Dwayne Bridges  WGN:[id number] DOB: 1951/09/23 DOA: 01/07/2020 PCP: Patient, [name] Per  Brief Narrative:  HPI: Dwayne Bridges is a 69 y.o. male with medical history significant of hypertension, hyperlipidemia, GERD, anxiety, dCHF, bipolar disorder, COVID-19 infection May/2020, atrial fibrillation on Eliquis, who presents with shortness breath and confusion.  Pt is from SNF. Pt was noted to have SOB in the facility. He had oxygen desaturation to 50s% per report.  Currently 94% on 4 L nasal cannula oxygen.  Patient denies any chest pain.  No active cough.  No active nausea, vomiting, diarrhea noted.  Does not seem to have abdominal pain.  Patient is mildly confused.  Per her niece (I called her niece by phone), patient normally is alert, orientated x3.  Currently patient knows his own name, is not oriented to time and place.  2/26: Patient seen and examined.  Titrated to 3 L nasal cannula.  Unclear if patient has any baseline oxygen requirement but no documentation indicates though.  Patient baseline mental status is also unclear.  Per the facility the patient is baseline confused however according to admitting physician patient's niece noticed him to be oriented x3.  Currently patient knows his name and that he is in the hospital but unable to provide further details.  2/27: Patient seen and examined.  Remains on 3 to 4 L nasal cannula.  Per bedside nurse patient might have a 2 L baseline oxygen requirement.  Still not at baseline.  Patient appears somewhat lethargic however is alert and oriented x3 this morning.  Net -1.6 L since admission.  Baseline weight is unclear.  Current weights are unreliable.  2/28: Patient seen and examined.  Remains on 4 L nasal cannula.  1.9 L net negative since admission.  3/1: Patient seen and examined.  Remains on 4 L nasal cannula.  Net -3.4 L since admission.  Down to 98.9 kg.  Appears to be diuresing well.   Assessment & Plan:   Principal  Problem:   Acute on chronic diastolic CHF (congestive heart failure) (HCC) Active Problems:   Acute on chronic respiratory failure with hypoxia (HCC)   HTN (hypertension)   HLD (hyperlipidemia)   Atrial fibrillation, chronic (HCC)   Bipolar disorder, unspecified (HCC)   Elevated troponin   Acute metabolic encephalopathy  Acute on chronic respiratory failure with hypoxia  acute on chronic diastolic CHF Patient has 1+ leg edema, bilateral pulmonary edema on chest x-ray, elevated BNP,  Pt had 2D echo on 06/30/2019, which showed:  "the left ventricle was not well visualized. The cavity size was normal. Left ventricular diastolic parameters were normal. No evidence of left ventricular regional wall motion abnormalities".  Plan: -Continue Lasix to 80 mg IV twice daily -Continue Zaroxolyn 2.5 mg daily, suspect we can discontinue after today's dose -2d echo: EF 60 to 65%, no new wall motion abnormalities -Daily weights -strict I/O's -Low salt diet -Fluid restriction -Continue lisinopril 2.5 mg daily  HTN:  -Continue home medications: Cardizem -Newly added lisinopril -hydralazine prn -Consider adding beta-blocker as outpatient.  Will defer now given acute decompensation  HLD (hyperlipidemia) -lipitor  Atrial fibrillation, chronic (HCC): -Continue Cardizem and Eliquis  Bipolar disorder, unspecified (HCC): -Depakote  Elevated troponin:  Troponin I 85.  No chest pain.   Possibly due to demand ischemia secondary to CHF exacerbation -Continue Lipitor   Acute metabolic encephalopathy:  Etiology is not clear.  May be due to hypoxia. Baseline mentation also unclear -Follow-up CT head:  negative -Frequent neuro check -Frequent reorientation   DVT prophylaxis: Eliquis Code Status: DNR Family Communication: None today Disposition Plan: Back to previous living facility.  Anticipate the patient will be at his dry weight within the next 24 hours Consultants:   None   Procedures:   None  Antimicrobials:   None   Subjective: Seen and examined Sleeping but easily awakened Oriented x3 Answers questions appropriately  Objective: Vitals:   01/10/20 2014 01/11/20 0409 01/11/20 0428 01/11/20 0807  BP:  107/77  (!) 129/52  Pulse:  71  75  Resp:  19  19  Temp:  97.6 F (36.4 C)  (!) 97.4 F (36.3 C)  TempSrc:  Oral  Oral  SpO2: 98% 94%  90%  Weight:   98.9 kg   Height:        Intake/Output Summary (Last 24 hours) at 01/11/2020 1221 Last data filed at 01/11/2020 0900 Gross per 24 hour  Intake 360 ml  Output 2425 ml  Net -2065 ml   Filed Weights   01/10/20 0500 01/10/20 0733 01/11/20 0428  Weight: 101.4 kg 101.5 kg 98.9 kg    Examination:  General exam: Appears calm and comfortable  Respiratory system: Bibasilar crackles, normal work of breathing  cardiovascular system: S1-S2 heard, 3/6 systolic murmur, regular rhythm, 2+ pitting edema bilaterally  gastrointestinal system: Obese, nondistended, soft and nontender. No organomegaly or masses felt. Normal bowel sounds heard. Central nervous system: Oriented x2, no focal deficits Extremities: Symmetric 5 x 5 power. Skin: No rashes, lesions or ulcers Psychiatry: Mildly confused    Data Reviewed: I have personally reviewed following labs and imaging studies  CBC: Recent Labs  Lab 01/07/20 1259 01/10/20 0539  WBC 6.7 5.9  HGB 12.1* 11.7*  HCT 39.6 38.1*  MCV 96.1 96.5  PLT 140* 152   Basic Metabolic Panel: Recent Labs  Lab 01/07/20 1259 01/08/20 0438 01/09/20 0438 01/10/20 0539 01/11/20 0610  NA 143 145 144 136 135  K 4.7 3.4* 3.6 3.7 3.2*  CL 91* 91* 92* 82* 74*  CO2 42* 46* 43* 46* 48*  GLUCOSE 141* 96 88 96 101*  BUN 21 20 16 13 12   CREATININE 0.66 0.67 0.53* 0.59* 0.56*  CALCIUM 8.9 8.7* 8.7* 8.7* 9.1  MG  --  2.1 2.0 1.9 2.0   GFR: Estimated Creatinine Clearance: 102.9 mL/min (A) (by C-G formula based on SCr of 0.56 mg/dL (L)). Liver Function Tests: Recent  Labs  Lab 01/07/20 1259  AST 48*  ALT 24  ALKPHOS 56  BILITOT 1.4*  PROT 7.4  ALBUMIN 2.7*   No results for input(s): LIPASE, AMYLASE in the last 168 hours. No results for input(s): AMMONIA in the last 168 hours. Coagulation Profile: No results for input(s): INR, PROTIME in the last 168 hours. Cardiac Enzymes: No results for input(s): CKTOTAL, CKMB, CKMBINDEX, TROPONINI in the last 168 hours. BNP (last 3 results) No results for input(s): PROBNP in the last 8760 hours. HbA1C: No results for input(s): HGBA1C in the last 72 hours. CBG: No results for input(s): GLUCAP in the last 168 hours. Lipid Profile: No results for input(s): CHOL, HDL, LDLCALC, TRIG, CHOLHDL, LDLDIRECT in the last 72 hours. Thyroid Function Tests: No results for input(s): TSH, T4TOTAL, FREET4, T3FREE, THYROIDAB in the last 72 hours. Anemia Panel: No results for input(s): VITAMINB12, FOLATE, FERRITIN, TIBC, IRON, RETICCTPCT in the last 72 hours. Sepsis Labs: No results for input(s): PROCALCITON, LATICACIDVEN in the last 168 hours.  Recent Results (from the past 240  hour(s))  SARS CORONAVIRUS 2 (TAT 6-24 HRS) Nasopharyngeal Nasopharyngeal Swab     Status: None   Collection Time: 01/07/20  3:07 PM   Specimen: Nasopharyngeal Swab  Result Value Ref Range Status   SARS Coronavirus 2 NEGATIVE NEGATIVE Final    Comment: (NOTE) SARS-CoV-2 target nucleic acids are NOT DETECTED. The SARS-CoV-2 RNA is generally detectable in upper and lower respiratory specimens during the acute phase of infection. Negative results do not preclude SARS-CoV-2 infection, do not rule out co-infections with other pathogens, and should not be used as the sole basis for treatment or other patient management decisions. Negative results must be combined with clinical observations, patient history, and epidemiological information. The expected result is Negative. Fact Sheet for Patients: SugarRoll.be Fact  Sheet for Healthcare Providers: https://www.woods-mathews.com/ This test is not yet approved or cleared by the [place] FDA and  has been authorized for detection and/or diagnosis of SARS-CoV-2 by FDA under an Emergency Use Authorization (EUA). This EUA will remain  in effect (meaning this test can be used) for the duration of the COVID-19 declaration under Section 56 4(b)(1) of the Act, 21 U.S.C. section 360bbb-3(b)(1), unless the authorization is terminated or revoked sooner. Performed at [facility], [place] [address], [place], [place] [address]   MRSA PCR Screening     Status: None   Collection Time: 01/07/20  5:52 PM   Specimen: Nasal Mucosa; Nasopharyngeal  Result Value Ref Range Status   MRSA by PCR NEGATIVE NEGATIVE Final    Comment:        The GeneXpert MRSA Assay (FDA approved for NASAL specimens only), is one component of a comprehensive MRSA colonization surveillance program. It is not intended to diagnose MRSA infection nor to guide or monitor treatment for MRSA infections. Performed at [facility], [address] [place], [place] [address]          Radiology Studies: No results found.      Scheduled Meds: . apixaban  5 mg Oral BID  . atorvastatin  20 mg Oral QHS  . dextromethorphan-guaiFENesin  1 tablet Oral BID  . diltiazem  180 mg Oral Daily  . divalproex  375 mg Oral BID  . finasteride  5 mg Oral Daily  . furosemide  80 mg Intravenous Q12H  . ipratropium-albuterol  3 mL Nebulization TID  . lisinopril  2.5 mg Oral Daily  . metolazone  2.5 mg Oral Daily  . multivitamin with minerals  1 tablet Oral Daily  . senna-docusate  1 tablet Oral BID  . sodium chloride flush  3 mL Intravenous Q12H  . terazosin  10 mg Oral QHS   Continuous Infusions: . sodium chloride       LOS: 4 days    Time spent: 35 minutes    Sidney Ace, MD [facility] Pager [phone]  If 7PM-7AM, please contact  night-coverage 01/11/2020, 12:21 PM

## 2020-01-11 NOTE — Plan of Care (Signed)
  Problem: Elimination: Goal: Will not experience complications related to urinary retention Outcome: Progressing  Continues to diurese with weight decreasing.

## 2020-01-11 NOTE — Progress Notes (Signed)
Pharmacy Electrolyte Monitoring Consult:  Pharmacy consulted to assist in monitoring and replacing electrolytes in this 69 y.o. male admitted on 01/07/2020 with Shortness of Breath   Labs:  Sodium (mmol/L)  Date Value  01/11/2020 135  12/05/2013 142   Potassium (mmol/L)  Date Value  01/11/2020 3.2 (L)  12/05/2013 3.1 (L)   Magnesium (mg/dL)  Date Value  84/01/7542 2.0  12/05/2013 1.8   Calcium (mg/dL)  Date Value  60/67/7034 9.1   Calcium, Total (mg/dL)  Date Value  03/52/4818 8.4 (L)   Albumin (g/dL)  Date Value  59/07/3111 2.7 (L)  12/01/2013 2.7 (L)    Assessment:  Pt here with CHF exacerbation started on IV lasix 60 mg IV q12h - increased to 80mg  q12.    Goal:  K>4, Mag >2 per consult   Plan: Will give KCl 40 mEq PO x1   Will follow up with AM labs.   Pharmacy will continue to follow.   , PharmD, BCPS Clinical Pharmacist 01/11/2020 8:53 AM

## 2020-01-12 LAB — BASIC METABOLIC PANEL
Anion gap: 14 (ref 5–15)
BUN: 17 mg/dL (ref 8–23)
CO2: 48 mmol/L — ABNORMAL HIGH (ref 22–32)
Calcium: 9 mg/dL (ref 8.9–10.3)
Chloride: 71 mmol/L — ABNORMAL LOW (ref 98–111)
Creatinine, Ser: 0.76 mg/dL (ref 0.61–1.24)
GFR calc Af Amer: >60 mL/min (ref >60)
GFR calc non Af Amer: >60 mL/min (ref >60)
Glucose, Bld: 107 mg/dL — ABNORMAL HIGH (ref 70–99)
Potassium: 2.9 mmol/L — ABNORMAL LOW (ref 3.5–5.1)
Sodium: 133 mmol/L — ABNORMAL LOW (ref 135–145)

## 2020-01-12 LAB — RESPIRATORY PANEL BY RT PCR (FLU A&B, COVID)
Influenza A by PCR: NEGATIVE
Influenza B by PCR: NEGATIVE
SARS Coronavirus 2 by RT PCR: NEGATIVE

## 2020-01-12 LAB — MAGNESIUM: Magnesium: 2 mg/dL (ref 1.7–2.4)

## 2020-01-12 MED ORDER — POTASSIUM CHLORIDE CRYS ER 20 MEQ PO TBCR: 40.0000 meq | EXTENDED_RELEASE_TABLET | Freq: Two times a day (BID) | ORAL | Status: DC

## 2020-01-12 MED ORDER — LISINOPRIL 2.5 MG PO TABS: 2.5000 mg | ORAL_TABLET | Freq: Every day | ORAL | 0 refills | Status: AC

## 2020-01-12 MED ORDER — POTASSIUM CHLORIDE 20 MEQ PO PACK
40.0000 meq | PACK | Freq: Two times a day (BID) | ORAL | Status: DC
  Administered 2020-01-12: 40 meq via ORAL
  Filled 2020-01-12: qty 2

## 2020-01-12 NOTE — Progress Notes (Addendum)
Report given to [facility] at [facility] . IV taken out and tele monitor off. Patient transporting via EMS.   1526: EMS here for transport.

## 2020-01-12 NOTE — Progress Notes (Signed)
Pharmacy Electrolyte Monitoring Consult:  Pharmacy consulted to assist in monitoring and replacing electrolytes in this 69 y.o. male admitted on 01/07/2020 with Shortness of Breath   Labs:  Sodium (mmol/L)  Date Value  01/12/2020 133 (L)  12/05/2013 142   Potassium (mmol/L)  Date Value  01/12/2020 2.9 (L)  12/05/2013 3.1 (L)   Magnesium (mg/dL)  Date Value  16/08/9603 2.0  12/05/2013 1.8   Calcium (mg/dL)  Date Value  54/07/8118 9.0   Calcium, Total (mg/dL)  Date Value  14/78/2956 8.4 (L)   Albumin (g/dL)  Date Value  21/30/8657 2.7 (L)  12/01/2013 2.7 (L)    Assessment:  Pt here with CHF exacerbation started on IV lasix 60 mg IV q12h - increased to 80mg  q12.    Goal:  K>4, Mag >2 per consult   Plan: Will start KCl 40 mEq PO BID while pt is on lasix.   Will follow up with AM labs.   Pharmacy will continue to follow.   , PharmD, BCPS Clinical Pharmacist 01/12/2020 7:49 AM

## 2020-01-12 NOTE — [place] FL2 (Signed)
[place] MEDICAID FL2 LEVEL OF CARE SCREENING TOOL     IDENTIFICATION  Patient Name: Dwayne Bridges Birthdate: 1950-11-27 Sex: male Admission Date (Current Location): 01/07/2020  [place] and [place] Number:  Chiropodist and Address:  [facility], [address], [place], [place] [address]      Provider Number: [id number]  Attending Physician Name and Address:  Tresa Moore, MD  Relative Name and Phone Number:       Current Level of Care: SNF Recommended Level of Care: Nursing Facility Prior Approval Number:    Date Approved/Denied:   PASRR Number:    Discharge Plan:      Current Diagnoses: Patient Active Problem List   Diagnosis Date Noted  . Atrial fibrillation, chronic (HCC) 01/07/2020  . Acute metabolic encephalopathy 01/07/2020  . Bipolar disorder, unspecified (HCC)   . Elevated troponin   . Acute respiratory failure with hypoxia (HCC) 06/30/2019  . Atrial fibrillation with RVR (HCC) 03/28/2019  . Acute on chronic diastolic CHF (congestive heart failure) (HCC) 03/27/2019  . Acute on chronic respiratory failure with hypoxia (HCC) 03/27/2019  . COVID-19 virus infection 03/27/2019  . HTN (hypertension) 03/27/2019  . BPH (benign prostatic hyperplasia) 03/27/2019  . HLD (hyperlipidemia) 03/27/2019  . CHF exacerbation (HCC) 03/27/2019    Orientation RESPIRATION BLADDER Height & Weight        Normal Incontinent Weight: 99.6 kg Height:  5\' 11"  (180.3 cm)  BEHAVIORAL SYMPTOMS/MOOD NEUROLOGICAL BOWEL NUTRITION STATUS      Incontinent Diet  AMBULATORY STATUS COMMUNICATION OF NEEDS Skin   Limited Assist Verbally                         Personal Care Assistance Level of Assistance  Bathing, Feeding, Dressing Bathing Assistance: Limited assistance Feeding assistance: Limited assistance Dressing Assistance: Limited assistance     Functional Limitations Info             SPECIAL CARE FACTORS FREQUENCY                       Contractures      Additional Factors Info  Code Status Code Status Info: DNR             Current Medications (01/12/2020):  This is the current hospital active medication list Current Facility-Administered Medications  Medication Dose Route Frequency Provider Last Rate Last Admin  . 0.9 %  sodium chloride infusion  250 mL Intravenous PRN 03/13/2020, MD      . acetaminophen (TYLENOL) tablet 650 mg  650 mg Oral Q6H PRN Lorretta Harp, MD   650 mg at 01/09/20 1412  . albuterol (PROVENTIL) (2.5 MG/3ML) 0.083% nebulizer solution 2.5 mg  2.5 mg Inhalation Q4H PRN 01/11/20, MD      . apixaban Lorretta Harp) tablet 5 mg  5 mg Oral BID Everlene Balls, MD   5 mg at 01/12/20 03/13/20  . atorvastatin (LIPITOR) tablet 20 mg  20 mg Oral QHS 1287, MD   20 mg at 01/11/20 2212  . dextromethorphan-guaiFENesin (MUCINEX DM) 30-600 MG per 12 hr tablet 1 tablet  1 tablet Oral BID 2213, MD   1 tablet at 01/12/20 [phone]  . diltiazem (CARDIZEM CD) 24 hr capsule 180 mg  180 mg Oral Daily [id number], MD   180 mg at 01/12/20 0947  . divalproex (DEPAKOTE SPRINKLE) capsule 375 mg  375 mg Oral BID 03/13/20, MD  375 mg at 01/12/20 0937  . finasteride (PROSCAR) tablet 5 mg  5 mg Oral Daily Ivor Costa, MD   5 mg at 01/12/20 4627  . furosemide (LASIX) injection 80 mg  80 mg Intravenous Q12H Ralene Muskrat B, MD   80 mg at 01/12/20 0459  . hydrALAZINE (APRESOLINE) tablet 25 mg  25 mg Oral TID PRN Ivor Costa, MD      . ipratropium-albuterol (DUONEB) 0.5-2.5 (3) MG/3ML nebulizer solution 3 mL  3 mL Nebulization TID Ralene Muskrat B, MD   3 mL at 01/12/20 0752  . lisinopril (ZESTRIL) tablet 2.5 mg  2.5 mg Oral Daily Ivor Costa, MD   2.5 mg at 01/11/20 0925  . metolazone (ZAROXOLYN) tablet 2.5 mg  2.5 mg Oral Daily Ralene Muskrat B, MD   2.5 mg at 01/12/20 0350  . multivitamin with minerals tablet 1 tablet  1 tablet Oral Daily Ivor Costa, MD   1 tablet at 01/12/20 [phone]  . ondansetron (ZOFRAN)  injection 4 mg  4 mg Intravenous Q6H PRN Ivor Costa, MD      . potassium chloride (KLOR-CON) packet 40 mEq  40 mEq Oral BID Oswald Hillock, RPH   40 mEq at 01/12/20 1829  . senna-docusate (Senokot-S) tablet 1 tablet  1 tablet Oral BID Ivor Costa, MD   1 tablet at 01/12/20 [phone]  . sodium chloride flush (NS) 0.9 % injection 3 mL  3 mL Intravenous Q12H Ivor Costa, MD   3 mL at 01/12/20 0938  . sodium chloride flush (NS) 0.9 % injection 3 mL  3 mL Intravenous PRN Ivor Costa, MD   3 mL at 01/11/20 0442  . terazosin (HYTRIN) capsule 10 mg  10 mg Oral QHS Ivor Costa, MD   10 mg at 01/11/20 2212     Discharge Medications: Please see discharge summary for a list of discharge medications.  Relevant Imaging Results:  Relevant Lab Results:   Additional Information SSN [id number]  Victorino Dike, RN

## 2020-01-12 NOTE — Plan of Care (Signed)

## 2020-01-12 NOTE — Discharge Summary (Signed)
Physician Discharge Summary  Dwayne Bridges QPY:[id number] DOB: Aug 05, 1951 DOA: 01/07/2020  PCP: Patient, [name] Per  Admit date: 01/07/2020 Discharge date: 01/12/2020  Admitted From: Skilled nursing facility Disposition: Back to skilled nursing facility, [facility]  Recommendations for Outpatient Follow-up:  1. Follow up with PCP in 1-2 weeks  Home Health: No Equipment/Devices: Oxygen, 2 L Discharge Condition: Stable CODE STATUS: DNR Diet recommendation: Heart Healthy / Carb Modified  Brief/Interim Summary: TIW:PYKD Dwayne Swansonis a 68 y.o.malewith medical history significant ofhypertension, hyperlipidemia, GERD, anxiety,dCHF, bipolar disorder, COVID-19 infection May/2020, atrial fibrillation on Eliquis, who presents with shortness breath and confusion.  Pt is from SNF. Pt was noted to have SOB in the facility. He hadoxygen desaturation to 50s% per report. Currently 94% on 4 L nasal cannula oxygen. Patient denies any chest pain. No active cough. No active nausea,vomiting, diarrhea noted. Does not seem to have abdominal pain. Patient is mildly confused. Per her niece (I called her niece by phone), patient normally is alert, orientated x3. Currently patient knows his own name, is not oriented to time and place.  2/26: Patient seen and examined.  Titrated to 3 L nasal cannula.  Unclear if patient has any baseline oxygen requirement but no documentation indicates though.  Patient baseline mental status is also unclear.  Per the facility the patient is baseline confused however according to admitting physician patient's niece noticed him to be oriented x3.  Currently patient knows his name and that he is in the hospital but unable to provide further details.  2/27: Patient seen and examined.  Remains on 3 to 4 L nasal cannula.  Per bedside nurse patient might have a 2 L baseline oxygen requirement.  Still not at baseline.  Patient appears somewhat lethargic however is alert and  oriented x3 this morning.  Net -1.6 L since admission.  Baseline weight is unclear.  Current weights are unreliable.  2/28: Patient seen and examined.  Remains on 4 L nasal cannula.  1.9 L net negative since admission.  3/1: Patient seen and examined.  Remains on 4 L nasal cannula.  Net -3.4 L since admission.  Down to 98.9 kg.  Appears to be diuresing well.  3/2: Patient seen and examined.  Titrated down to 2 L nasal cannula.  Net -4.8 L since admission.  Diuresing well.  Respiratory status improved.  Stable for discharge home at this time.  Patient will return back to long-term care at skilled nursing facility   Discharge Diagnoses:  Principal Problem:   Acute on chronic diastolic CHF (congestive heart failure) (HCC) Active Problems:   Acute on chronic respiratory failure with hypoxia (HCC)   HTN (hypertension)   HLD (hyperlipidemia)   Atrial fibrillation, chronic (HCC)   Bipolar disorder, unspecified (HCC)   Elevated troponin   Acute metabolic encephalopathy  Acute on chronic respiratory failure with hypoxia acute on chronic diastolic CHF Patient has 1+ leg edema, bilateral pulmonary edema on chest x-ray, elevated BNP,  Pt had2D echo on 06/30/2019, which showed: "the left ventricle was not well visualized. The cavity size was normal. Left ventricular diastolic parameters were normal. No evidence of left ventricular regional wall motion abnormalities". Plan: 80 mg twice daily IV Lasix was initiated in the hospital.  Patient was not diuresed appropriately so we added Zaroxolyn 2.5 mg daily for 2 days.  Patient diuresed well and return back to home oxygen rate.  Patient stable for discharge at this time.  Will resume home diuretic regimen of Lasix 40  mg daily.  Will need PCP follow-up and further titration of diuretic regimen.  Echocardiogram demonstrated EF of 60 to 65% with no new wall motion abnormalities.  Difficult to ascertain what patient's dry weight is however given his  respiratory status and his overall clear lungs he has been diuresed sufficiently for discharge   HTN:  -Continue home medications:Cardizem Can continue home medication Cardizem on discharge.  Low-dose lisinopril added.  Patient will need PCP follow-up and consideration for addition of beta-blocker post discharge.  HLD (hyperlipidemia) -lipitor  Atrial fibrillation, chronic (HCC): -Continue Cardizem and Eliquis  Bipolar disorder, unspecified (HCC): -Depakote  Elevated troponin: Troponin I 85. No chest pain.  Possibly due to demand ischemia secondary to CHF exacerbation -Continue Lipitor  Acute metabolic encephalopathy: Etiology is not clear. May be due to hypoxia. Baseline mentation also unclear -Follow-up CT head: negative   Discharge Instructions  Discharge Instructions    AMB referral to CHF clinic   Complete by: As directed    AMB referral to pulmonary rehabilitation   Complete by: As directed    Please select a program: Respiratory Care Services   Respiratory Care Services Diagnosis: Heart Failure   After initial evaluation and assessments completed: Virtual Based Care may be provided alone or in conjunction with Pulmonary Rehab/Respiratory Care services based on patient barriers.: Yes   Diet - low sodium heart healthy   Complete by: As directed    Increase activity slowly   Complete by: As directed      Allergies as of 01/12/2020   No Known Allergies     Medication List    TAKE these medications   acetaminophen 325 MG tablet Commonly known as: TYLENOL Take 650 mg by mouth every 6 (six) hours as needed (leg pain).   apixaban 5 MG Tabs tablet Commonly known as: ELIQUIS Take 1 tablet (5 mg total) by mouth 2 (two) times daily.   atorvastatin 20 MG tablet Commonly known as: LIPITOR Take 20 mg by mouth at bedtime.   diltiazem 180 MG 24 hr capsule Commonly known as: DILACOR XR Take 180 mg by mouth daily.   divalproex 125 MG capsule Commonly  known as: DEPAKOTE SPRINKLE Take 375 mg by mouth 2 (two) times daily.   finasteride 5 MG tablet Commonly known as: PROSCAR Take 5 mg by mouth daily.   furosemide 40 MG tablet Commonly known as: LASIX Take 1 tablet (40 mg total) by mouth 2 (two) times daily.   ipratropium-albuterol 0.5-2.5 (3) MG/3ML Soln Commonly known as: DUONEB Take 3 mLs by nebulization every 6 (six) hours as needed (wheezing, shortness of breath).   lisinopril 2.5 MG tablet Commonly known as: ZESTRIL Take 1 tablet (2.5 mg total) by mouth daily. Start taking on: January 13, 2020   multivitamin tablet Take 1 tablet by mouth daily.   potassium chloride SA 20 MEQ tablet Commonly known as: KLOR-CON Take 1 tablet (20 mEq total) by mouth daily.   sennosides-docusate sodium 8.6-50 MG tablet Commonly known as: SENOKOT-S Take 1 tablet by mouth 2 (two) times daily.   terazosin 10 MG capsule Commonly known as: HYTRIN Take 10 mg by mouth at bedtime.      Follow-up Information    [facility] Follow up on 01/19/2020.   Specialty: Cardiology Why: at 12:30pm. Enter through the [facility] Mall entrance Contact information: [address] [place] [id number] [phone]         No Known Allergies  Consultations:  none   Procedures/Studies: CT HEAD WO CONTRAST  Result Date: 01/07/2020 CLINICAL DATA:  Oxygen desaturations, encephalopathy confusion EXAM: CT HEAD WITHOUT CONTRAST TECHNIQUE: Contiguous axial images were obtained from the base of the skull through the vertex without intravenous contrast. COMPARISON:  06/30/2019 FINDINGS: Brain: Chronic encephalomalacia within the bilateral occipital lobes, right greater than left. Ex vacuo dilatation of the occipital horn of the right lateral ventricle unchanged. There are chronic small vessel ischemic changes within the bilateral basal ganglia, right greater than left. No signs of acute infarct or  hemorrhage. Remaining midline structures are unremarkable. No acute extra-axial fluid collections. No mass effect. Vascular: No hyperdense vessel or unexpected calcification. Skull: Normal. Negative for fracture or focal lesion. Sinuses/Orbits: Previous bilateral partial ethmoidectomies. Mild mucosal thickening throughout the ethmoid air cells. Remaining sinuses are clear. Other: None IMPRESSION: 1. Stable head CT, no acute intracranial process. Electronically Signed   By: Sharlet Salina M.D.   On: 01/07/2020 19:20   DG Chest Portable 1 View  Result Date: 01/07/2020 CLINICAL DATA:  Shortness of breath. EXAM: PORTABLE CHEST 1 VIEW COMPARISON:  July 01, 2019. FINDINGS: Stable cardiomegaly. No pneumothorax is noted. New bilateral perihilar and basilar opacities are noted concerning for edema or possibly inflammation. Small right pleural effusion is noted. Bony thorax is unremarkable. IMPRESSION: New bilateral perihilar and basilar opacities are noted concerning for edema or possibly inflammation. Small right pleural effusion is noted. Electronically Signed   By: Lupita Raider M.D.   On: 01/07/2020 13:39   ECHOCARDIOGRAM COMPLETE  Result Date: 01/08/2020    ECHOCARDIOGRAM REPORT   Patient Name:   ZAKI GERTSCH Date of Exam: 01/08/2020 Medical Rec #:  [id number]      Height:       71.0 in Accession #:    [id number]     Weight:       221.3 lb Date of Birth:  1951/07/07      BSA:          2.201 m Patient Age:    68 years       BP:           110/70 mmHg Patient Gender: M              HR:           87 bpm. Exam Location:  [facility] Procedure: 2D Echo, Color Doppler, Cardiac Doppler and Intracardiac            Opacification Agent Indications:     I50.31 CHF-Acute Diastolic  History:         Patient has prior history of Echocardiogram examinations. CHF;                  Risk Factors:Sleep Apnea and Hypertension.  Sonographer:     Humphrey Rolls RDCS (AE) Referring Phys:  [id number] Brien Few NIU Diagnosing Phys: Julien Nordmann MD   Sonographer Comments: Suboptimal apical window. IMPRESSIONS  1. Left ventricular ejection fraction, by estimation, is 60 to 65%. The left ventricle has normal function. The left ventricle has no regional wall motion abnormalities. There is mild left ventricular hypertrophy. Left ventricular diastolic parameters are indeterminate.  2. Right ventricular systolic function is normal. The right ventricular size is normal. Tricuspid regurgitation signal is inadequate for assessing PA pressure.  3. Left atrial size was severely dilated.  4. Mild mitral stenosis. The mean mitral valve gradient is 7.0 mmHg.  5. The aortic valve is heavily calcified. Moderate aortic valve stenosis.  6.  There is mild dilatation of the aortic root. FINDINGS  Left Ventricle: Left ventricular ejection fraction, by estimation, is 60 to 65%. The left ventricle has normal function. The left ventricle has no regional wall motion abnormalities. Definity contrast agent was given IV to delineate the left ventricular  endocardial borders. The left ventricular internal cavity size was normal in size. There is mild left ventricular hypertrophy. Left ventricular diastolic parameters are indeterminate. Right Ventricle: The right ventricular size is normal. No increase in right ventricular wall thickness. Right ventricular systolic function is normal. Tricuspid regurgitation signal is inadequate for assessing PA pressure. Left Atrium: Left atrial size was severely dilated. Right Atrium: Right atrial size was normal in size. Pericardium: There is no evidence of pericardial effusion. Mitral Valve: The mitral valve was not well visualized. There is moderate calcification of the mitral valve leaflet(s). Normal mobility of the mitral valve leaflets. Moderate to severe mitral annular calcification. No evidence of mitral valve regurgitation. Mild mitral valve stenosis. MV peak gradient, 15.7 mmHg. The mean mitral valve gradient is 7.0 mmHg. Tricuspid Valve: The  tricuspid valve is normal in structure. Tricuspid valve regurgitation is not demonstrated. No evidence of tricuspid stenosis. Aortic Valve: The aortic valve is normal in structure and function. Aortic valve regurgitation is not visualized. Moderate aortic stenosis is present. Aortic valve mean gradient measures 24.0 mmHg. Aortic valve peak gradient measures 43.2 mmHg. Aortic valve area, by VTI measures 1.37 cm. Pulmonic Valve: The pulmonic valve was normal in structure. Pulmonic valve regurgitation is not visualized. No evidence of pulmonic stenosis. Aorta: The aortic root is normal in size and structure. There is mild dilatation of the aortic root. Venous: The inferior vena cava is normal in size with greater than 50% respiratory variability, suggesting right atrial pressure of 3 mmHg. IAS/Shunts: No atrial level shunt detected by color flow Doppler.  LEFT VENTRICLE PLAX 2D LVIDd:         4.37 cm  Diastology LVIDs:         3.29 cm  LV e' lateral:   7.72 cm/s LV PW:         1.18 cm  LV E/e' lateral: 24.7 LV IVS:        1.04 cm  LV e' medial:    7.72 cm/s LVOT diam:     2.30 cm  LV E/e' medial:  24.7 LV SV:         77 LV SV Index:   35 LVOT Area:     4.15 cm  RIGHT VENTRICLE RV Basal diam:  3.35 cm LEFT ATRIUM            Index       RIGHT ATRIUM           Index LA diam:      5.50 cm  2.50 cm/m  RA Area:     15.70 cm LA Vol (A4C): 116.0 ml 52.70 ml/m RA Volume:   37.00 ml  16.81 ml/m  AORTIC VALVE                    PULMONIC VALVE AV Area (Vmax):    1.35 cm     PV Vmax:       1.13 m/s AV Area (Vmean):   1.37 cm     PV Vmean:      77.400 cm/s AV Area (VTI):     1.37 cm     PV VTI:        0.190 m AV  Vmax:           328.50 cm/s  PV Peak grad:  5.1 mmHg AV Vmean:          226.000 cm/s PV Mean grad:  3.0 mmHg AV VTI:            0.560 m AV Peak Grad:      43.2 mmHg AV Mean Grad:      24.0 mmHg LVOT Vmax:         107.00 cm/s LVOT Vmean:        74.600 cm/s LVOT VTI:          0.185 m LVOT/AV VTI ratio: 0.33  AORTA Ao  Root diam: 3.90 cm MITRAL VALVE MV Area (PHT): 3.21 cm     SHUNTS MV Peak grad:  15.7 mmHg    Systemic VTI:  0.18 m MV Mean grad:  7.0 mmHg     Systemic Diam: 2.30 cm MV Vmax:       1.98 m/s MV Vmean:      125.0 cm/s MV Decel Time: 236 msec MV E velocity: 191.00 cm/s Julien Nordmann MD Electronically signed by Julien Nordmann MD Signature Date/Time: 01/08/2020/11:18:06 AM    Final     (Echo, Carotid, EGD, Colonoscopy, ERCP)    Subjective: Patient seen and examined on the day of discharge Stable, no distress Titrate down to 2 L Stable for discharge back to skilled nursing facility  Discharge Exam: Vitals:   01/12/20 0745 01/12/20 1001  BP: (!) 91/58 90/69  Pulse: 79 91  Resp: 16   Temp: 98.3 F (36.8 C)   SpO2: 96%    Vitals:   01/11/20 1943 01/12/20 0444 01/12/20 0745 01/12/20 1001  BP:  (!) 90/51 (!) 91/58 90/69  Pulse:  85 79 91  Resp:  20 16   Temp:  98 F (36.7 C) 98.3 F (36.8 C)   TempSrc:  Oral Oral   SpO2: 95% 96% 96%   Weight:  99.6 kg    Height:        General: Pt is alert, awake, not in acute distress Cardiovascular: RRR, S1/S2 +, no rubs, no gallops Respiratory: CTA bilaterally, no wheezing, no rhonchi Abdominal: Soft, NT, ND, bowel sounds + Extremities: no edema, no cyanosis    The results of significant diagnostics from this hospitalization (including imaging, microbiology, ancillary and laboratory) are listed below for reference.     Microbiology: Recent Results (from the past 240 hour(s))  SARS CORONAVIRUS 2 (TAT 6-24 HRS) Nasopharyngeal Nasopharyngeal Swab     Status: None   Collection Time: 01/07/20  3:07 PM   Specimen: Nasopharyngeal Swab  Result Value Ref Range Status   SARS Coronavirus 2 NEGATIVE NEGATIVE Final    Comment: (NOTE) SARS-CoV-2 target nucleic acids are NOT DETECTED. The SARS-CoV-2 RNA is generally detectable in upper and lower respiratory specimens during the acute phase of infection. Negative results do not preclude SARS-CoV-2  infection, do not rule out co-infections with other pathogens, and should not be used as the sole basis for treatment or other patient management decisions. Negative results must be combined with clinical observations, patient history, and epidemiological information. The expected result is Negative. Fact Sheet for Patients: HairSlick.no Fact Sheet for Healthcare Providers: quierodirigir.com This test is not yet approved or cleared by the [place] FDA and  has been authorized for detection and/or diagnosis of SARS-CoV-2 by FDA under an Emergency Use Authorization (EUA). This EUA will remain  in effect (meaning this test can  be used) for the duration of the COVID-19 declaration under Section 56 4(b)(1) of the Act, 21 U.S.C. section 360bbb-3(b)(1), unless the authorization is terminated or revoked sooner. Performed at [facility], [place] [address] [place], [place] [address]   MRSA PCR Screening     Status: None   Collection Time: 01/07/20  5:52 PM   Specimen: Nasal Mucosa; Nasopharyngeal  Result Value Ref Range Status   MRSA by PCR NEGATIVE NEGATIVE Final    Comment:        The GeneXpert MRSA Assay (FDA approved for NASAL specimens only), is one component of a comprehensive MRSA colonization surveillance program. It is not intended to diagnose MRSA infection nor to guide or monitor treatment for MRSA infections. Performed at [facility], [address] [place], [place] [address]      Labs: BNP (last 3 results) Recent Labs    03/30/19 0306 06/30/19 0906 01/07/20 1259  BNP 98.1 168.0* 102.7*   Basic Metabolic Panel: Recent Labs  Lab 01/08/20 0438 01/09/20 0438 01/10/20 0539 01/11/20 0610 01/12/20 0641  NA 145 144 136 135 133*  K 3.4* 3.6 3.7 3.2* 2.9*  CL 91* 92* 82* 74* 71*  CO2 46* 43* 46* 48* 48*  GLUCOSE 96 88 96 101* 107*  BUN 20 16 13 12 17   CREATININE 0.67 0.53* 0.59* 0.56*  0.76  CALCIUM 8.7* 8.7* 8.7* 9.1 9.0  MG 2.1 2.0 1.9 2.0 2.0   Liver Function Tests: Recent Labs  Lab 01/07/20 1259  AST 48*  ALT 24  ALKPHOS 56  BILITOT 1.4*  PROT 7.4  ALBUMIN 2.7*   No results for input(s): LIPASE, AMYLASE in the last 168 hours. No results for input(s): AMMONIA in the last 168 hours. CBC: Recent Labs  Lab 01/07/20 1259 01/10/20 0539  WBC 6.7 5.9  HGB 12.1* 11.7*  HCT 39.6 38.1*  MCV 96.1 96.5  PLT 140* 152   Cardiac Enzymes: No results for input(s): CKTOTAL, CKMB, CKMBINDEX, TROPONINI in the last 168 hours. BNP: Invalid input(s): POCBNP CBG: No results for input(s): GLUCAP in the last 168 hours. D-Dimer No results for input(s): DDIMER in the last 72 hours. Hgb A1c No results for input(s): HGBA1C in the last 72 hours. Lipid Profile No results for input(s): CHOL, HDL, LDLCALC, TRIG, CHOLHDL, LDLDIRECT in the last 72 hours. Thyroid function studies No results for input(s): TSH, T4TOTAL, T3FREE, THYROIDAB in the last 72 hours.  Invalid input(s): FREET3 Anemia work up No results for input(s): VITAMINB12, FOLATE, FERRITIN, TIBC, IRON, RETICCTPCT in the last 72 hours. Urinalysis    Component Value Date/Time   COLORURINE AMBER (A) 06/30/2019 0902   APPEARANCEUR CLOUDY (A) 06/30/2019 0902   APPEARANCEUR Clear 03/25/2014 2000   LABSPEC 1.019 06/30/2019 0902   LABSPEC 1.016 03/25/2014 2000   PHURINE 5.0 06/30/2019 0902   GLUCOSEU NEGATIVE 06/30/2019 0902   GLUCOSEU Negative 03/25/2014 2000   HGBUR NEGATIVE 06/30/2019 0902   BILIRUBINUR NEGATIVE 06/30/2019 0902   BILIRUBINUR Negative 03/25/2014 2000   KETONESUR NEGATIVE 06/30/2019 0902   PROTEINUR 100 (A) 06/30/2019 0902   NITRITE NEGATIVE 06/30/2019 0902   LEUKOCYTESUR NEGATIVE 06/30/2019 0902   LEUKOCYTESUR Negative 03/25/2014 2000   Sepsis Labs Invalid input(s): PROCALCITONIN,  WBC,  LACTICIDVEN Microbiology Recent Results (from the past 240 hour(s))  SARS CORONAVIRUS 2 (TAT 6-24 HRS)  Nasopharyngeal Nasopharyngeal Swab     Status: None   Collection Time: 01/07/20  3:07 PM   Specimen: Nasopharyngeal Swab  Result Value Ref Range  Status   SARS Coronavirus 2 NEGATIVE NEGATIVE Final    Comment: (NOTE) SARS-CoV-2 target nucleic acids are NOT DETECTED. The SARS-CoV-2 RNA is generally detectable in upper and lower respiratory specimens during the acute phase of infection. Negative results do not preclude SARS-CoV-2 infection, do not rule out co-infections with other pathogens, and should not be used as the sole basis for treatment or other patient management decisions. Negative results must be combined with clinical observations, patient history, and epidemiological information. The expected result is Negative. Fact Sheet for Patients: HairSlick.nohttps://www.fda.gov/media/138098/download Fact Sheet for Healthcare Providers: quierodirigir.comhttps://www.fda.gov/media/138095/download This test is not yet approved or cleared by the [place] FDA and  has been authorized for detection and/or diagnosis of SARS-CoV-2 by FDA under an Emergency Use Authorization (EUA). This EUA will remain  in effect (meaning this test can be used) for the duration of the COVID-19 declaration under Section 56 4(b)(1) of the Act, 21 U.S.C. section 360bbb-3(b)(1), unless the authorization is terminated or revoked sooner. Performed at [facility], [address] [place], [place] [address]   MRSA PCR Screening     Status: None   Collection Time: 01/07/20  5:52 PM   Specimen: Nasal Mucosa; Nasopharyngeal  Result Value Ref Range Status   MRSA by PCR NEGATIVE NEGATIVE Final    Comment:        The GeneXpert MRSA Assay (FDA approved for NASAL specimens only), is one component of a comprehensive MRSA colonization surveillance program. It is not intended to diagnose MRSA infection nor to guide or monitor treatment for MRSA infections. Performed at [facility], [address] [place], [place]  [address]      Time coordinating discharge: Over 30 minutes  SIGNED:   Tresa MooreSudheer B Darienne Belleau, MD  [facility] 01/12/2020, 11:40 AM Pager   If 7PM-7AM, please contact night-coverage

## 2020-01-12 NOTE — Plan of Care (Signed)
  Problem: Activity: Goal: Capacity to carry out activities will improve Outcome: Progressing   Problem: Clinical Measurements: Goal: Respiratory complications will improve Outcome: Progressing   

## 2020-01-13 ENCOUNTER — Telehealth: Payer: Self-pay | Admitting: Family

## 2020-01-13 NOTE — Telephone Encounter (Signed)
Spoke with patient who called to verify his new patient appt that was scheduled while he was in the hospital for the CHF Clinic on 3/9. Patient stated his is doing well since he left hospital with minimal symptoms and the occasional SOB. He is following a low sodium diet, checking weight daily, and now knows to call us if any symptoms appear.    Deetta Perla, [place]

## 2020-01-17 ENCOUNTER — Encounter: Payer: Self-pay | Admitting: Radiology

## 2020-01-17 ENCOUNTER — Other Ambulatory Visit: Payer: Self-pay

## 2020-01-17 ENCOUNTER — Inpatient Hospital Stay
Admission: EM | Admit: 2020-01-17 | Discharge: 2020-02-11 | DRG: 871 | Disposition: E | Payer: No Typology Code available for payment source | Attending: Internal Medicine | Admitting: Internal Medicine

## 2020-01-17 ENCOUNTER — Emergency Department: Payer: No Typology Code available for payment source

## 2020-01-17 DIAGNOSIS — E785 Hyperlipidemia, unspecified: Secondary | ICD-10-CM | POA: Diagnosis present

## 2020-01-17 DIAGNOSIS — N179 Acute kidney failure, unspecified: Secondary | ICD-10-CM | POA: Diagnosis present

## 2020-01-17 DIAGNOSIS — Z7901 Long term (current) use of anticoagulants: Secondary | ICD-10-CM

## 2020-01-17 DIAGNOSIS — Z87891 Personal history of nicotine dependence: Secondary | ICD-10-CM

## 2020-01-17 DIAGNOSIS — A419 Sepsis, unspecified organism: Secondary | ICD-10-CM | POA: Diagnosis present

## 2020-01-17 DIAGNOSIS — Z9981 Dependence on supplemental oxygen: Secondary | ICD-10-CM

## 2020-01-17 DIAGNOSIS — I5033 Acute on chronic diastolic (congestive) heart failure: Secondary | ICD-10-CM | POA: Diagnosis present

## 2020-01-17 DIAGNOSIS — Z6831 Body mass index (BMI) 31.0-31.9, adult: Secondary | ICD-10-CM

## 2020-01-17 DIAGNOSIS — J9621 Acute and chronic respiratory failure with hypoxia: Secondary | ICD-10-CM | POA: Diagnosis present

## 2020-01-17 DIAGNOSIS — E876 Hypokalemia: Secondary | ICD-10-CM | POA: Diagnosis present

## 2020-01-17 DIAGNOSIS — E860 Dehydration: Secondary | ICD-10-CM | POA: Diagnosis present

## 2020-01-17 DIAGNOSIS — R9389 Abnormal findings on diagnostic imaging of other specified body structures: Secondary | ICD-10-CM

## 2020-01-17 DIAGNOSIS — J189 Pneumonia, unspecified organism: Secondary | ICD-10-CM | POA: Diagnosis present

## 2020-01-17 DIAGNOSIS — K219 Gastro-esophageal reflux disease without esophagitis: Secondary | ICD-10-CM | POA: Diagnosis present

## 2020-01-17 DIAGNOSIS — J9620 Acute and chronic respiratory failure, unspecified whether with hypoxia or hypercapnia: Secondary | ICD-10-CM

## 2020-01-17 DIAGNOSIS — E662 Morbid (severe) obesity with alveolar hypoventilation: Secondary | ICD-10-CM | POA: Diagnosis present

## 2020-01-17 DIAGNOSIS — R140 Abdominal distension (gaseous): Secondary | ICD-10-CM | POA: Diagnosis present

## 2020-01-17 DIAGNOSIS — Z66 Do not resuscitate: Secondary | ICD-10-CM | POA: Diagnosis present

## 2020-01-17 DIAGNOSIS — R6521 Severe sepsis with septic shock: Secondary | ICD-10-CM | POA: Diagnosis present

## 2020-01-17 DIAGNOSIS — I482 Chronic atrial fibrillation, unspecified: Secondary | ICD-10-CM | POA: Diagnosis present

## 2020-01-17 DIAGNOSIS — I110 Hypertensive heart disease with heart failure: Secondary | ICD-10-CM | POA: Diagnosis present

## 2020-01-17 DIAGNOSIS — F319 Bipolar disorder, unspecified: Secondary | ICD-10-CM | POA: Diagnosis present

## 2020-01-17 DIAGNOSIS — F419 Anxiety disorder, unspecified: Secondary | ICD-10-CM | POA: Diagnosis present

## 2020-01-17 DIAGNOSIS — J9622 Acute and chronic respiratory failure with hypercapnia: Secondary | ICD-10-CM | POA: Diagnosis present

## 2020-01-17 DIAGNOSIS — J440 Chronic obstructive pulmonary disease with acute lower respiratory infection: Secondary | ICD-10-CM | POA: Diagnosis present

## 2020-01-17 DIAGNOSIS — I959 Hypotension, unspecified: Secondary | ICD-10-CM | POA: Diagnosis present

## 2020-01-17 DIAGNOSIS — T17908A Unspecified foreign body in respiratory tract, part unspecified causing other injury, initial encounter: Secondary | ICD-10-CM

## 2020-01-17 DIAGNOSIS — Z7189 Other specified counseling: Secondary | ICD-10-CM

## 2020-01-17 DIAGNOSIS — Z8249 Family history of ischemic heart disease and other diseases of the circulatory system: Secondary | ICD-10-CM

## 2020-01-17 DIAGNOSIS — Z79899 Other long term (current) drug therapy: Secondary | ICD-10-CM

## 2020-01-17 DIAGNOSIS — N400 Enlarged prostate without lower urinary tract symptoms: Secondary | ICD-10-CM | POA: Diagnosis present

## 2020-01-17 DIAGNOSIS — E440 Moderate protein-calorie malnutrition: Secondary | ICD-10-CM | POA: Diagnosis present

## 2020-01-17 DIAGNOSIS — Z515 Encounter for palliative care: Secondary | ICD-10-CM

## 2020-01-17 DIAGNOSIS — E874 Mixed disorder of acid-base balance: Secondary | ICD-10-CM | POA: Diagnosis present

## 2020-01-17 DIAGNOSIS — E870 Hyperosmolality and hypernatremia: Secondary | ICD-10-CM | POA: Diagnosis not present

## 2020-01-17 DIAGNOSIS — J9600 Acute respiratory failure, unspecified whether with hypoxia or hypercapnia: Secondary | ICD-10-CM

## 2020-01-17 DIAGNOSIS — E871 Hypo-osmolality and hyponatremia: Secondary | ICD-10-CM | POA: Diagnosis present

## 2020-01-17 DIAGNOSIS — R0902 Hypoxemia: Secondary | ICD-10-CM

## 2020-01-17 DIAGNOSIS — Z8616 Personal history of COVID-19: Secondary | ICD-10-CM

## 2020-01-17 DIAGNOSIS — N4889 Other specified disorders of penis: Secondary | ICD-10-CM | POA: Diagnosis present

## 2020-01-17 DIAGNOSIS — I10 Essential (primary) hypertension: Secondary | ICD-10-CM | POA: Diagnosis present

## 2020-01-17 LAB — CBC WITH DIFFERENTIAL/PLATELET
Abs Immature Granulocytes: 0.07 10*3/uL (ref 0.00–0.07)
Basophils Absolute: 0 10*3/uL (ref 0.0–0.1)
Basophils Relative: 0 %
Eosinophils Absolute: 0 10*3/uL (ref 0.0–0.5)
Eosinophils Relative: 0 %
HCT: 34.8 % — ABNORMAL LOW (ref 39.0–52.0)
Hemoglobin: 11.5 g/dL — ABNORMAL LOW (ref 13.0–17.0)
Immature Granulocytes: 1 %
Lymphocytes Relative: 13 %
Lymphs Abs: 1.2 10*3/uL (ref 0.7–4.0)
MCH: 30.2 pg (ref 26.0–34.0)
MCHC: 33 g/dL (ref 30.0–36.0)
MCV: 91.3 fL (ref 80.0–100.0)
Monocytes Absolute: 0.8 10*3/uL (ref 0.1–1.0)
Monocytes Relative: 9 %
Neutro Abs: 7.7 10*3/uL (ref 1.7–7.7)
Neutrophils Relative %: 77 %
Platelets: 201 10*3/uL (ref 150–400)
RBC: 3.81 MIL/uL — ABNORMAL LOW (ref 4.22–5.81)
RDW: 14.9 % (ref 11.5–15.5)
WBC: 9.9 10*3/uL (ref 4.0–10.5)
nRBC: 0 % (ref 0.0–0.2)

## 2020-01-17 LAB — COMPREHENSIVE METABOLIC PANEL
ALT: 15 U/L (ref 0–44)
AST: 26 U/L (ref 15–41)
Albumin: 2.7 g/dL — ABNORMAL LOW (ref 3.5–5.0)
Alkaline Phosphatase: 63 U/L (ref 38–126)
Anion gap: 16 — ABNORMAL HIGH (ref 5–15)
BUN: 45 mg/dL — ABNORMAL HIGH (ref 8–23)
CO2: 39 mmol/L — ABNORMAL HIGH (ref 22–32)
Calcium: 8.1 mg/dL — ABNORMAL LOW (ref 8.9–10.3)
Chloride: 73 mmol/L — ABNORMAL LOW (ref 98–111)
Creatinine, Ser: 1.82 mg/dL — ABNORMAL HIGH (ref 0.61–1.24)
GFR calc Af Amer: 43 mL/min — ABNORMAL LOW (ref >60)
GFR calc non Af Amer: 37 mL/min — ABNORMAL LOW (ref >60)
Glucose, Bld: 113 mg/dL — ABNORMAL HIGH (ref 70–99)
Potassium: 3.2 mmol/L — ABNORMAL LOW (ref 3.5–5.1)
Sodium: 128 mmol/L — ABNORMAL LOW (ref 135–145)
Total Bilirubin: 0.9 mg/dL (ref 0.3–1.2)
Total Protein: 6.7 g/dL (ref 6.5–8.1)

## 2020-01-17 LAB — URINALYSIS, ROUTINE W REFLEX MICROSCOPIC
Bilirubin Urine: NEGATIVE
Glucose, UA: NEGATIVE mg/dL
Hgb urine dipstick: NEGATIVE
Ketones, ur: NEGATIVE mg/dL
Leukocytes,Ua: NEGATIVE
Nitrite: NEGATIVE
Protein, ur: NEGATIVE mg/dL
Specific Gravity, Urine: 1.006 (ref 1.005–1.030)
pH: 7 (ref 5.0–8.0)

## 2020-01-17 LAB — LACTIC ACID, PLASMA: Lactic Acid, Venous: 3.5 mmol/L (ref 0.5–1.9)

## 2020-01-17 LAB — APTT: aPTT: 36 seconds (ref 24–36)

## 2020-01-17 LAB — PROTIME-INR
INR: 1.6 — ABNORMAL HIGH (ref 0.8–1.2)
Prothrombin Time: 18.8 seconds — ABNORMAL HIGH (ref 11.4–15.2)

## 2020-01-17 MED ORDER — SODIUM CHLORIDE 0.9 % IV SOLN
2.0000 g | Freq: Once | INTRAVENOUS | Status: AC
  Administered 2020-01-17: 2 g via INTRAVENOUS
  Filled 2020-01-17: qty 2

## 2020-01-17 MED ORDER — SODIUM CHLORIDE 0.9 % IV BOLUS (SEPSIS)
1000.0000 mL | Freq: Once | INTRAVENOUS | Status: AC
  Administered 2020-01-17: 1000 mL via INTRAVENOUS

## 2020-01-17 MED ORDER — IOHEXOL 350 MG/ML SOLN
75.0000 mL | Freq: Once | INTRAVENOUS | Status: AC | PRN
  Administered 2020-01-17: 75 mL via INTRAVENOUS

## 2020-01-17 MED ORDER — SODIUM CHLORIDE 0.9 % IV BOLUS (SEPSIS)
1000.0000 mL | Freq: Once | INTRAVENOUS | Status: AC
  Administered 2020-01-18: 1000 mL via INTRAVENOUS

## 2020-01-17 MED ORDER — VANCOMYCIN HCL IN DEXTROSE 1-5 GM/200ML-% IV SOLN
1000.0000 mg | Freq: Once | INTRAVENOUS | Status: DC
  Filled 2020-01-17: qty 200

## 2020-01-17 MED ORDER — VANCOMYCIN HCL 2000 MG/400ML IV SOLN
2000.0000 mg | Freq: Once | INTRAVENOUS | Status: AC
  Administered 2020-01-17: 2000 mg via INTRAVENOUS
  Filled 2020-01-17: qty 400

## 2020-01-17 NOTE — ED Notes (Signed)
Lab at bedside to collect blood cultures due to unsuccessful attempts by rns and EDT.

## 2020-01-17 NOTE — ED Provider Notes (Signed)
[facility] Emergency Department Provider Note  ____________________________________________  Time seen: Approximately 11:35 PM  I have reviewed the triage vital signs and the nursing notes.   HISTORY  Chief Complaint Hypotension    Level 5 Caveat: Portions of the History and Physical including HPI and review of systems are unable to be completely obtained due to patient being a poor historian   HPI Dwayne Bridges is a 69 y.o. male with a history of hypertension, atrial fibrillation, GERD, CHF, bipolar disorder  who was sent to the ED from [facility] due to hypotension.  Initial blood pressure at the facility was 56/33.  Multiple repeat checks by EMS have been in the range of 70/40.  Patient denies any acute complaints.  He is [place].  He was recently hospitalized for CHF exacerbation, discharged 5 days ago back to his nursing facility.  Reportedly patient's oral intake has been poor since returning home, and mental status has been declining.  CODE STATUS is DNR.   Past Medical History:  Diagnosis Date  . Anxiety   . Bipolar disorder, unspecified (HCC)   . CHF (congestive heart failure) (HCC)   . Disorientation, unspecified   . GERD (gastroesophageal reflux disease)   . Hypertension   . Hypokalemia   . Sleep apnea, unspecified      Patient Active Problem List   Diagnosis Date Noted  . Atrial fibrillation, chronic (HCC) 01/07/2020  . Acute metabolic encephalopathy 01/07/2020  . Bipolar disorder, unspecified (HCC)   . Elevated troponin   . Acute respiratory failure with hypoxia (HCC) 06/30/2019  . Atrial fibrillation with RVR (HCC) 03/28/2019  . Acute on chronic diastolic CHF (congestive heart failure) (HCC) 03/27/2019  . Acute on chronic respiratory failure with hypoxia (HCC) 03/27/2019  . COVID-19 virus infection 03/27/2019  . HTN (hypertension) 03/27/2019  . BPH (benign prostatic hyperplasia) 03/27/2019  . HLD (hyperlipidemia) 03/27/2019   . CHF exacerbation (HCC) 03/27/2019     No past surgical history on file.   Prior to Admission medications   Medication Sig Start Date End Date Taking? Authorizing Provider  acetaminophen (TYLENOL) 325 MG tablet Take 650 mg by mouth every 6 (six) hours as needed (leg pain).   Yes [provider]  apixaban (ELIQUIS) 5 MG TABS tablet Take 1 tablet (5 mg total) by mouth 2 (two) times daily. 03/31/19  Yes Leroy Sea, MD  atorvastatin (LIPITOR) 20 MG tablet Take 20 mg by mouth at bedtime.    Yes [provider]  diltiazem (DILACOR XR) 180 MG 24 hr capsule Take 180 mg by mouth daily.   Yes [provider]  divalproex (DEPAKOTE SPRINKLE) 125 MG capsule Take 375 mg by mouth 2 (two) times daily.   Yes [provider]  finasteride (PROSCAR) 5 MG tablet Take 5 mg by mouth daily.   Yes [provider]  furosemide (LASIX) 40 MG tablet Take 1 tablet (40 mg total) by mouth 2 (two) times daily. 03/31/19  Yes Leroy Sea, MD  ipratropium-albuterol (DUONEB) 0.5-2.5 (3) MG/3ML SOLN Take 3 mLs by nebulization every 6 (six) hours as needed (wheezing, shortness of breath). 07/03/19  Yes Enid Baas, MD  lisinopril (ZESTRIL) 2.5 MG tablet Take 1 tablet (2.5 mg total) by mouth daily. 01/13/20 02/12/20 Yes Sreenath, Sudheer B, MD  Multiple Vitamin (MULTIVITAMIN) tablet Take 1 tablet by mouth daily.   Yes [provider]  potassium chloride (K-DUR) 20 MEQ tablet Take 1 tablet (20 mEq total) by mouth  daily. 03/31/19  Yes Leroy Sea, MD  sennosides-docusate sodium (SENOKOT-S) 8.6-50 MG tablet Take 1 tablet by mouth 2 (two) times daily.   Yes [provider]  terazosin (HYTRIN) 10 MG capsule Take 10 mg by mouth at bedtime.   Yes [provider]     Allergies Patient has no known allergies.   No family history on file.  Social History Social History   Tobacco Use  . Smoking status: Former Smoker    Packs/day: 0.50     Years: 10.00    Pack years: 5.00    Types: Cigarettes    Quit date: 11/12/2008    Years since quitting: 11.1  . Smokeless tobacco: Never Used  Substance Use Topics  . Alcohol use: Not on file  . Drug use: Not on file    Review of Systems Level 5 Caveat: Portions of the History and Physical including HPI and review of systems are unable to be completely obtained due to patient being a poor historian   Constitutional:   No known fever.  ENT:   No rhinorrhea. Cardiovascular:   No chest pain or syncope. Respiratory:   No dyspnea or cough. Gastrointestinal:   Negative for abdominal pain, vomiting and diarrhea.  Musculoskeletal:   Negative for focal pain or swelling ____________________________________________   PHYSICAL EXAM:  VITAL SIGNS: ED Triage Vitals  Enc Vitals Group     BP 20-Jan-2020 2111 (!) 140/99     Pulse Rate 01-20-20 2111 64     Resp 01/20/2020 2111 12     Temp 01-20-20 2154 97.7 F (36.5 C)     Temp Source 01-20-20 2154 Oral     SpO2 01/20/20 2111 94 %     Weight 01-20-20 2112 219 lb 9.6 oz (99.6 kg)     Height 2020-01-20 2112 5\' 11"  (1.803 m)     Head Circumference --      Peak Flow --      Pain Score 01/20/20 2112 0     Pain Loc --      Pain Edu? --      Excl. in GC? --     Vital signs reviewed, nursing assessments reviewed.   Constitutional:   Awake and alert.. Not oriented.  Ill-appearing..  obese Eyes:   Conjunctivae are normal. EOMI. PERRL. ENT      Head:   Normocephalic and atraumatic.      Nose:   No congestion/rhinnorhea.       Mouth/Throat:   Dry mucous membranes, no pharyngeal erythema. No peritonsillar mass.       Neck:   No meningismus. Full ROM. Hematological/Lymphatic/Immunilogical:   No cervical lymphadenopathy. Cardiovascular:   RRR. Symmetric bilateral radial and DP pulses.  No murmurs. Cap refill 4 seconds. Respiratory:   Normal respiratory effort without tachypnea/retractions. Breath sounds are clear and equal bilaterally. No  wheezes/rales/rhonchi. Gastrointestinal:   Soft and nontender. Non distended. There is no CVA tenderness.  No rebound, rigidity, or guarding. Musculoskeletal:   Normal range of motion in all extremities. No joint effusions.  No lower extremity tenderness.  No edema. Neurologic:   Normal speech, limited language expression.  Motor grossly intact. No acute focal neurologic deficits are appreciated.  Skin:    Skin is warm, dry and intact. No rash noted.  No petechiae, purpura, or bullae.  ____________________________________________    LABS (pertinent positives/negatives) (all labs ordered are listed, but only abnormal results are displayed) Labs Reviewed  LACTIC ACID, PLASMA - Abnormal; Notable for  the following components:      Result Value   Lactic Acid, Venous 3.5 (*)    All other components within normal limits  COMPREHENSIVE METABOLIC PANEL - Abnormal; Notable for the following components:   Sodium 128 (*)    Potassium 3.2 (*)    Chloride 73 (*)    CO2 39 (*)    Glucose, Bld 113 (*)    BUN 45 (*)    Creatinine, Ser 1.82 (*)    Calcium 8.1 (*)    Albumin 2.7 (*)    GFR calc non Af Amer 37 (*)    GFR calc Af Amer 43 (*)    Anion gap 16 (*)    All other components within normal limits  CBC WITH DIFFERENTIAL/PLATELET - Abnormal; Notable for the following components:   RBC 3.81 (*)    Hemoglobin 11.5 (*)    HCT 34.8 (*)    All other components within normal limits  PROTIME-INR - Abnormal; Notable for the following components:   Prothrombin Time 18.8 (*)    INR 1.6 (*)    All other components within normal limits  URINALYSIS, ROUTINE W REFLEX MICROSCOPIC - Abnormal; Notable for the following components:   Color, Urine YELLOW (*)    APPearance CLEAR (*)    All other components within normal limits  CULTURE, BLOOD (ROUTINE X 2)  CULTURE, BLOOD (ROUTINE X 2)  URINE CULTURE  SARS CORONAVIRUS 2 (TAT 6-24 HRS)  APTT  LACTIC ACID, PLASMA    ____________________________________________   EKG  Interpreted by me Atrial fibrillation, rate of 60.  Normal axis and intervals.  Normal QRS ST segments and T waves.  Repeat EKG interpreted by me atrial fibrillation rate of 69, no acute changes.  ____________________________________________    RADIOLOGY  DG Chest Port 1 View  Result Date: January 25, 2020 CLINICAL DATA:  69 year old male with hypotension and hypoxia. EXAM: PORTABLE CHEST 1 VIEW COMPARISON:  Portable chest 01/07/2020 and earlier. FINDINGS: Portable AP view at 2150 hours. Continued low lung volumes. Stable cardiac size and mediastinal contours. Visualized tracheal air column is within normal limits. Decreased veiling opacity and/or vascular congestion compared to last month. Allowing for portable technique the lungs are clear. No pneumothorax. Negative visible bowel gas pattern. No acute osseous abnormality identified. IMPRESSION: Low lung volumes but improved ventilation since last month. No acute cardiopulmonary abnormality. Electronically Signed   By: Odessa Fleming M.D.   On: January 25, 2020 22:03    ____________________________________________   PROCEDURES .Critical Care Performed by: Sharman Cheek, MD Authorized by: Sharman Cheek, MD   Critical care provider statement:    Critical care time (minutes):  40   Critical care time was exclusive of:  Separately billable procedures and treating other patients   Critical care was necessary to treat or prevent imminent or life-threatening deterioration of the following conditions:  Sepsis, shock and dehydration   Critical care was time spent personally by me on the following activities:  Development of treatment plan with patient or surrogate, discussions with consultants, evaluation of patient's response to treatment, examination of patient, obtaining history from patient or surrogate, ordering and performing treatments and interventions, ordering and review of laboratory  studies, ordering and review of radiographic studies, pulse oximetry, re-evaluation of patient's condition and review of old charts    ____________________________________________  DIFFERENTIAL DIAGNOSIS   Dehydration, hypovolemic shock, septic shock, pneumonia, UTI, PE, non-STEMI  CLINICAL IMPRESSION / ASSESSMENT AND PLAN / ED COURSE  Medications ordered in the ED: Medications  sodium chloride 0.9 % bolus 1,000 mL (0 mLs Intravenous Stopped 01/16/2020 2229)    And  sodium chloride 0.9 % bolus 1,000 mL (1,000 mLs Intravenous New Bag/Given 02/07/2020 2232)    And  sodium chloride 0.9 % bolus 1,000 mL (has no administration in time range)  vancomycin (VANCOREADY) IVPB 2000 mg/400 mL (2,000 mg Intravenous New Bag/Given 01/30/2020 2212)  ceFEPIme (MAXIPIME) 2 g in sodium chloride 0.9 % 100 mL IVPB (0 g Intravenous Stopped 01/22/2020 2219)  iohexol (OMNIPAQUE) 350 MG/ML injection 75 mL (75 mLs Intravenous Contrast Given 01/28/2020 2301)    Pertinent labs & imaging results that were available during my care of the patient were reviewed by me and considered in my medical decision making (see chart for details).   HARRINGTON JOBE was evaluated in Emergency Department on 02/04/2020 for the symptoms described in the history of present illness. He was evaluated in the context of the global COVID-19 pandemic, which necessitated consideration that the patient might be at risk for infection with the SARS-CoV-2 virus that causes COVID-19. Institutional protocols and algorithms that pertain to the evaluation of patients at risk for COVID-19 are in a state of rapid change based on information released by regulatory bodies including the CDC and federal and state organizations. These policies and algorithms were followed during the patient's care in the ED.   Patient arrives with persistent hypotension.  No clear infectious source, but ill-appearing so empiric antibiotics are started on arrival with cefepime and vancomycin for  presumed HCAP.  Other vital signs unremarkable.  Lab panel shows AKI creatinine 1, hyponatremia.  Lactate is 3.5. CBC normal. CXR non diagnostic. UA normal. CTA chest ordered to eval for PE or occult pna.   ----------------------------------------- 11:42 PM on 02/09/2020 -----------------------------------------  Sepsis reassessment is been completed.  Still persistent hypotension. Repeat lactate pending. Continuing IV fluids.  D/w pt's Niece, Fransisca Connors, who confirms DNR but otherwise pt is full scope of care currently, including IVF, abx and meds as needed.    She also notes that the patient has had the social abilities of "an 69 year old boy" throughout his life.  She believes he has undiagnosed autism spectrum disorder and does not have dementia.  She notes that hospitals are scary for him.       ____________________________________________   FINAL CLINICAL IMPRESSION(S) / ED DIAGNOSES    Final diagnoses:  Septic shock ([place])  Dehydration  AKI (acute kidney injury) [facility])     ED Discharge Orders    None      Portions of this note were generated with dragon dictation software. Dictation errors may occur despite best attempts at proofreading.   Carrie Mew, MD 02/09/2020 [phone]

## 2020-01-17 NOTE — ED Notes (Signed)
Date and time results received: 02/09/2020 9:46 PM   Test: lactic acid Critical Value: 3.5  Name of Provider Notified: Scotty Court

## 2020-01-17 NOTE — ED Notes (Signed)
Pt taken to CT at this time.

## 2020-01-17 NOTE — Progress Notes (Signed)
PHARMACY -  BRIEF ANTIBIOTIC NOTE   Pharmacy has received consult(s) for Vancomycin, Cefepime from an ED provider.  The patient's profile has been reviewed for ht/wt/allergies/indication/available labs.    One time order(s) placed for Vancomycin 2 gm IV X 1 and Cefepime 2 gm IV X 1  Further antibiotics/pharmacy consults should be ordered by admitting physician if indicated.                       Thank you, Matheus Spiker D 02-02-2020  9:34 PM

## 2020-01-17 NOTE — ED Triage Notes (Addendum)
Pt here via [facility] from [facility] with c/o hypotension.   Staff reports pt's blood pressure was 56/33 at 1930 this evening. Pt was recently hospitalized and has had declining mental status since. EMS reports bp of 88/41.  EMS HR 72, afib on monitor, temp 99.2 axillary.  Pt has hx of CHF, pt is on 2L [employer] since his prior hospitalization, tremor at baseline.   Pt received 500cc normal saline on route.

## 2020-01-17 NOTE — ED Notes (Signed)
Lab at bedside to collect repeat lactic acid.

## 2020-01-17 NOTE — Progress Notes (Signed)
CODE SEPSIS - PHARMACY COMMUNICATION  **Broad Spectrum Antibiotics should be administered within 1 hour of Sepsis diagnosis**  Time Code Sepsis Called/Page Received:  3/7 @ 2116   Antibiotics Ordered: Vancomycin , Cefepime   Time of 1st antibiotic administration:  Cefepime 2 gm IV X 1 on 3/7 @ 2147   Additional action taken by pharmacy:   If necessary, Name of Provider/Nurse Contacted:      Farnell D ,PharmD Clinical Pharmacist  01/15/2020  10:07 PM

## 2020-01-17 NOTE — ED Notes (Signed)
Provider at bedside

## 2020-01-18 ENCOUNTER — Encounter: Payer: Self-pay | Admitting: Internal Medicine

## 2020-01-18 ENCOUNTER — Emergency Department: Payer: No Typology Code available for payment source

## 2020-01-18 DIAGNOSIS — K219 Gastro-esophageal reflux disease without esophagitis: Secondary | ICD-10-CM | POA: Diagnosis present

## 2020-01-18 DIAGNOSIS — J9620 Acute and chronic respiratory failure, unspecified whether with hypoxia or hypercapnia: Secondary | ICD-10-CM | POA: Diagnosis not present

## 2020-01-18 DIAGNOSIS — J189 Pneumonia, unspecified organism: Secondary | ICD-10-CM | POA: Diagnosis present

## 2020-01-18 DIAGNOSIS — J9601 Acute respiratory failure with hypoxia: Secondary | ICD-10-CM | POA: Diagnosis not present

## 2020-01-18 DIAGNOSIS — E440 Moderate protein-calorie malnutrition: Secondary | ICD-10-CM | POA: Diagnosis present

## 2020-01-18 DIAGNOSIS — I5033 Acute on chronic diastolic (congestive) heart failure: Secondary | ICD-10-CM | POA: Diagnosis present

## 2020-01-18 DIAGNOSIS — F319 Bipolar disorder, unspecified: Secondary | ICD-10-CM | POA: Diagnosis present

## 2020-01-18 DIAGNOSIS — J9622 Acute and chronic respiratory failure with hypercapnia: Secondary | ICD-10-CM | POA: Diagnosis present

## 2020-01-18 DIAGNOSIS — N400 Enlarged prostate without lower urinary tract symptoms: Secondary | ICD-10-CM | POA: Diagnosis present

## 2020-01-18 DIAGNOSIS — Z66 Do not resuscitate: Secondary | ICD-10-CM | POA: Diagnosis present

## 2020-01-18 DIAGNOSIS — A419 Sepsis, unspecified organism: Principal | ICD-10-CM

## 2020-01-18 DIAGNOSIS — E860 Dehydration: Secondary | ICD-10-CM

## 2020-01-18 DIAGNOSIS — E870 Hyperosmolality and hypernatremia: Secondary | ICD-10-CM | POA: Diagnosis not present

## 2020-01-18 DIAGNOSIS — E662 Morbid (severe) obesity with alveolar hypoventilation: Secondary | ICD-10-CM | POA: Diagnosis present

## 2020-01-18 DIAGNOSIS — Z7189 Other specified counseling: Secondary | ICD-10-CM | POA: Diagnosis not present

## 2020-01-18 DIAGNOSIS — I482 Chronic atrial fibrillation, unspecified: Secondary | ICD-10-CM

## 2020-01-18 DIAGNOSIS — R140 Abdominal distension (gaseous): Secondary | ICD-10-CM | POA: Diagnosis not present

## 2020-01-18 DIAGNOSIS — J9621 Acute and chronic respiratory failure with hypoxia: Secondary | ICD-10-CM | POA: Diagnosis present

## 2020-01-18 DIAGNOSIS — R0902 Hypoxemia: Secondary | ICD-10-CM | POA: Diagnosis not present

## 2020-01-18 DIAGNOSIS — E861 Hypovolemia: Secondary | ICD-10-CM

## 2020-01-18 DIAGNOSIS — E871 Hypo-osmolality and hyponatremia: Secondary | ICD-10-CM

## 2020-01-18 DIAGNOSIS — R6521 Severe sepsis with septic shock: Secondary | ICD-10-CM | POA: Diagnosis present

## 2020-01-18 DIAGNOSIS — N4889 Other specified disorders of penis: Secondary | ICD-10-CM | POA: Diagnosis present

## 2020-01-18 DIAGNOSIS — N179 Acute kidney failure, unspecified: Secondary | ICD-10-CM | POA: Diagnosis present

## 2020-01-18 DIAGNOSIS — Z515 Encounter for palliative care: Secondary | ICD-10-CM | POA: Diagnosis not present

## 2020-01-18 DIAGNOSIS — E785 Hyperlipidemia, unspecified: Secondary | ICD-10-CM | POA: Diagnosis present

## 2020-01-18 DIAGNOSIS — I959 Hypotension, unspecified: Secondary | ICD-10-CM | POA: Diagnosis present

## 2020-01-18 DIAGNOSIS — I10 Essential (primary) hypertension: Secondary | ICD-10-CM

## 2020-01-18 DIAGNOSIS — J440 Chronic obstructive pulmonary disease with acute lower respiratory infection: Secondary | ICD-10-CM | POA: Diagnosis present

## 2020-01-18 DIAGNOSIS — E874 Mixed disorder of acid-base balance: Secondary | ICD-10-CM | POA: Diagnosis present

## 2020-01-18 DIAGNOSIS — F419 Anxiety disorder, unspecified: Secondary | ICD-10-CM | POA: Diagnosis present

## 2020-01-18 DIAGNOSIS — I110 Hypertensive heart disease with heart failure: Secondary | ICD-10-CM | POA: Diagnosis present

## 2020-01-18 DIAGNOSIS — I9589 Other hypotension: Secondary | ICD-10-CM

## 2020-01-18 DIAGNOSIS — Z8616 Personal history of COVID-19: Secondary | ICD-10-CM | POA: Diagnosis not present

## 2020-01-18 DIAGNOSIS — E876 Hypokalemia: Secondary | ICD-10-CM

## 2020-01-18 LAB — VALPROIC ACID LEVEL: Valproic Acid Lvl: 60 ug/mL (ref 50.0–100.0)

## 2020-01-18 LAB — CBC
HCT: 36.4 % — ABNORMAL LOW (ref 39.0–52.0)
Hemoglobin: 12 g/dL — ABNORMAL LOW (ref 13.0–17.0)
MCH: 30.5 pg (ref 26.0–34.0)
MCHC: 33 g/dL (ref 30.0–36.0)
MCV: 92.6 fL (ref 80.0–100.0)
Platelets: 177 10*3/uL (ref 150–400)
RBC: 3.93 MIL/uL — ABNORMAL LOW (ref 4.22–5.81)
RDW: 14.9 % (ref 11.5–15.5)
WBC: 10.3 10*3/uL (ref 4.0–10.5)
nRBC: 0 % (ref 0.0–0.2)

## 2020-01-18 LAB — COMPREHENSIVE METABOLIC PANEL
ALT: 15 U/L (ref 0–44)
AST: 23 U/L (ref 15–41)
Albumin: 2.7 g/dL — ABNORMAL LOW (ref 3.5–5.0)
Alkaline Phosphatase: 61 U/L (ref 38–126)
Anion gap: 10 (ref 5–15)
BUN: 36 mg/dL — ABNORMAL HIGH (ref 8–23)
CO2: 38 mmol/L — ABNORMAL HIGH (ref 22–32)
Calcium: 7.9 mg/dL — ABNORMAL LOW (ref 8.9–10.3)
Chloride: 83 mmol/L — ABNORMAL LOW (ref 98–111)
Creatinine, Ser: 1.1 mg/dL (ref 0.61–1.24)
GFR calc Af Amer: >60 mL/min (ref >60)
GFR calc non Af Amer: >60 mL/min (ref >60)
Glucose, Bld: 96 mg/dL (ref 70–99)
Potassium: 3.4 mmol/L — ABNORMAL LOW (ref 3.5–5.1)
Sodium: 131 mmol/L — ABNORMAL LOW (ref 135–145)
Total Bilirubin: 0.7 mg/dL (ref 0.3–1.2)
Total Protein: 6.5 g/dL (ref 6.5–8.1)

## 2020-01-18 LAB — MRSA PCR SCREENING: MRSA by PCR: NEGATIVE

## 2020-01-18 LAB — OSMOLALITY: Osmolality: 284 mOsm/kg (ref 275–295)

## 2020-01-18 LAB — PROCALCITONIN: Procalcitonin: 0.16 ng/mL

## 2020-01-18 LAB — MAGNESIUM
Magnesium: 1.8 mg/dL (ref 1.7–2.4)
Magnesium: 1.6 mg/dL — ABNORMAL LOW (ref 1.7–2.4)

## 2020-01-18 LAB — PHOSPHORUS
Phosphorus: 4.2 mg/dL (ref 2.5–4.6)
Phosphorus: 3.2 mg/dL (ref 2.5–4.6)

## 2020-01-18 LAB — CK: Total CK: 104 U/L (ref 49–397)

## 2020-01-18 LAB — GLUCOSE, CAPILLARY: Glucose-Capillary: 80 mg/dL (ref 70–99)

## 2020-01-18 LAB — LACTIC ACID, PLASMA
Lactic Acid, Venous: 1 mmol/L (ref 0.5–1.9)
Lactic Acid, Venous: 1.3 mmol/L (ref 0.5–1.9)
Lactic Acid, Venous: 1.9 mmol/L (ref 0.5–1.9)

## 2020-01-18 LAB — RESPIRATORY PANEL BY RT PCR (FLU A&B, COVID)
Influenza A by PCR: NEGATIVE
Influenza B by PCR: NEGATIVE
SARS Coronavirus 2 by RT PCR: NEGATIVE

## 2020-01-18 LAB — OSMOLALITY, URINE: Osmolality, Ur: 298 mOsm/kg — ABNORMAL LOW (ref 300–900)

## 2020-01-18 LAB — TSH: TSH: 1.281 u[IU]/mL (ref 0.350–4.500)

## 2020-01-18 LAB — CREATININE, URINE, RANDOM: Creatinine, Urine: 34 mg/dL

## 2020-01-18 LAB — SODIUM, URINE, RANDOM: Sodium, Ur: 66 mmol/L

## 2020-01-18 LAB — TROPONIN I (HIGH SENSITIVITY): Troponin I (High Sensitivity): 13 ng/L (ref <18)

## 2020-01-18 MED ORDER — ONDANSETRON HCL 4 MG PO TABS: 4.0000 mg | ORAL_TABLET | Freq: Four times a day (QID) | ORAL | Status: DC | PRN

## 2020-01-18 MED ORDER — ENSURE ENLIVE PO LIQD
237.0000 mL | Freq: Two times a day (BID) | ORAL | Status: DC
  Administered 2020-01-19 – 2020-01-28 (×17): 237 mL via ORAL

## 2020-01-18 MED ORDER — POTASSIUM CHLORIDE 10 MEQ/100ML IV SOLN
10.0000 meq | INTRAVENOUS | Status: AC
  Administered 2020-01-18 (×3): 10 meq via INTRAVENOUS
  Filled 2020-01-18 (×3): qty 100

## 2020-01-18 MED ORDER — NOREPINEPHRINE 4 MG/250ML-% IV SOLN
2.0000 ug/min | INTRAVENOUS | Status: DC
  Administered 2020-01-18: 2 ug/min via INTRAVENOUS
  Filled 2020-01-18: qty 250

## 2020-01-18 MED ORDER — ACETAMINOPHEN 325 MG PO TABS: 650.0000 mg | ORAL_TABLET | Freq: Four times a day (QID) | ORAL | Status: DC | PRN

## 2020-01-18 MED ORDER — SODIUM CHLORIDE 0.9 % IV SOLN: 250.0000 mL | INTRAVENOUS | Status: DC

## 2020-01-18 MED ORDER — ATORVASTATIN CALCIUM 20 MG PO TABS
20.0000 mg | ORAL_TABLET | Freq: Every day | ORAL | Status: DC
  Administered 2020-01-18 – 2020-01-29 (×13): 20 mg via ORAL
  Filled 2020-01-18 (×13): qty 1

## 2020-01-18 MED ORDER — SODIUM CHLORIDE 0.9 % IV SOLN
2.0000 g | Freq: Two times a day (BID) | INTRAVENOUS | Status: DC
  Administered 2020-01-18: 2 g via INTRAVENOUS
  Filled 2020-01-18 (×2): qty 2

## 2020-01-18 MED ORDER — MAGNESIUM SULFATE 2 GM/50ML IV SOLN
2.0000 g | Freq: Once | INTRAVENOUS | Status: AC
  Administered 2020-01-18: 2 g via INTRAVENOUS
  Filled 2020-01-18: qty 50

## 2020-01-18 MED ORDER — LACTATED RINGERS IV BOLUS
2000.0000 mL | Freq: Once | INTRAVENOUS | Status: AC
  Administered 2020-01-18: 2000 mL via INTRAVENOUS

## 2020-01-18 MED ORDER — CHLORHEXIDINE GLUCONATE CLOTH 2 % EX PADS
6.0000 | MEDICATED_PAD | Freq: Every day | CUTANEOUS | Status: DC
  Administered 2020-01-18 – 2020-01-30 (×12): 6 via TOPICAL

## 2020-01-18 MED ORDER — ACETAMINOPHEN 650 MG RE SUPP: 650.0000 mg | Freq: Four times a day (QID) | RECTAL | Status: DC | PRN

## 2020-01-18 MED ORDER — HYDROCORTISONE NA SUCCINATE PF 100 MG IJ SOLR
50.0000 mg | Freq: Four times a day (QID) | INTRAMUSCULAR | Status: DC
  Administered 2020-01-18 – 2020-01-19 (×5): 50 mg via INTRAVENOUS
  Filled 2020-01-18 (×5): qty 2

## 2020-01-18 MED ORDER — ONDANSETRON HCL 4 MG/2ML IJ SOLN: 4.0000 mg | Freq: Four times a day (QID) | INTRAMUSCULAR | Status: DC | PRN

## 2020-01-18 MED ORDER — SODIUM CHLORIDE 0.9 % IV SOLN
250.0000 mL | INTRAVENOUS | Status: DC
  Administered 2020-01-18: 250 mL via INTRAVENOUS

## 2020-01-18 MED ORDER — PHENYLEPHRINE HCL-NACL 10-0.9 MG/250ML-% IV SOLN
25.0000 ug/min | INTRAVENOUS | Status: DC
  Administered 2020-01-18: 07:00:00 100 ug/min via INTRAVENOUS
  Filled 2020-01-18: qty 250

## 2020-01-18 MED ORDER — PHENYLEPHRINE HCL-NACL 10-0.9 MG/250ML-% IV SOLN
25.0000 ug/min | INTRAVENOUS | Status: DC
  Administered 2020-01-18: 25 ug/min via INTRAVENOUS
  Filled 2020-01-18 (×2): qty 250

## 2020-01-18 MED ORDER — HYDROCODONE-ACETAMINOPHEN 5-325 MG PO TABS: 1.0000 | ORAL_TABLET | ORAL | Status: DC | PRN

## 2020-01-18 MED ORDER — SODIUM CHLORIDE 0.9 % IV SOLN: INTRAVENOUS | Status: DC

## 2020-01-18 MED ORDER — IPRATROPIUM-ALBUTEROL 0.5-2.5 (3) MG/3ML IN SOLN
3.0000 mL | Freq: Four times a day (QID) | RESPIRATORY_TRACT | Status: DC | PRN
  Administered 2020-01-30 – 2020-01-31 (×3): 3 mL via RESPIRATORY_TRACT
  Filled 2020-01-18 (×3): qty 3

## 2020-01-18 MED ORDER — LACTATED RINGERS IV SOLN: INTRAVENOUS | Status: DC

## 2020-01-18 MED ORDER — POTASSIUM CHLORIDE 20 MEQ PO PACK: 40.0000 meq | PACK | Freq: Once | ORAL | Status: DC

## 2020-01-18 MED ORDER — SODIUM CHLORIDE 0.9 % IV BOLUS
1000.0000 mL | Freq: Once | INTRAVENOUS | Status: AC
  Administered 2020-01-18: 1000 mL via INTRAVENOUS

## 2020-01-18 MED ORDER — METRONIDAZOLE IN NACL 5-0.79 MG/ML-% IV SOLN
500.0000 mg | Freq: Three times a day (TID) | INTRAVENOUS | Status: AC
  Administered 2020-01-18 – 2020-01-25 (×20): 500 mg via INTRAVENOUS
  Filled 2020-01-18 (×22): qty 100

## 2020-01-18 MED ORDER — SODIUM CHLORIDE 0.9 % IV SOLN
2.0000 g | Freq: Three times a day (TID) | INTRAVENOUS | Status: AC
  Administered 2020-01-18 – 2020-01-25 (×21): 2 g via INTRAVENOUS
  Filled 2020-01-18 (×23): qty 2

## 2020-01-18 MED ORDER — POTASSIUM CHLORIDE 10 MEQ/100ML IV SOLN
10.0000 meq | INTRAVENOUS | Status: AC
  Administered 2020-01-18 (×2): 10 meq via INTRAVENOUS
  Filled 2020-01-18 (×2): qty 100

## 2020-01-18 MED ORDER — SENNOSIDES-DOCUSATE SODIUM 8.6-50 MG PO TABS
1.0000 | ORAL_TABLET | Freq: Two times a day (BID) | ORAL | Status: DC
  Administered 2020-01-18 – 2020-01-30 (×21): 1 via ORAL
  Filled 2020-01-18 (×22): qty 1

## 2020-01-18 MED ORDER — DIVALPROEX SODIUM 125 MG PO CSDR
375.0000 mg | DELAYED_RELEASE_CAPSULE | Freq: Two times a day (BID) | ORAL | Status: DC
  Administered 2020-01-18 – 2020-01-30 (×26): 375 mg via ORAL
  Filled 2020-01-18 (×30): qty 3

## 2020-01-18 MED ORDER — LACTATED RINGERS IV BOLUS: 1000.0000 mL | Freq: Once | INTRAVENOUS | Status: DC

## 2020-01-18 MED ORDER — SODIUM CHLORIDE 0.9 % IV SOLN
250.0000 mL | INTRAVENOUS | Status: DC
  Administered 2020-01-23 – 2020-01-24 (×5): 250 mL via INTRAVENOUS

## 2020-01-18 MED ORDER — SODIUM CHLORIDE 0.9 % IV SOLN
25.0000 ug/min | INTRAVENOUS | Status: DC
  Administered 2020-01-18: 125 ug/min via INTRAVENOUS
  Administered 2020-01-18 (×2): 70 ug/min via INTRAVENOUS
  Administered 2020-01-18: 75 ug/min via INTRAVENOUS
  Administered 2020-01-18: 60 ug/min via INTRAVENOUS
  Administered 2020-01-18: 125 ug/min via INTRAVENOUS
  Administered 2020-01-19: 30 ug/min via INTRAVENOUS
  Filled 2020-01-18 (×2): qty 10
  Filled 2020-01-18: qty 1
  Filled 2020-01-18 (×5): qty 10

## 2020-01-18 MED ORDER — NOREPINEPHRINE 4 MG/250ML-% IV SOLN
2.0000 ug/min | INTRAVENOUS | Status: DC
  Filled 2020-01-18: qty 250

## 2020-01-18 MED ORDER — NOREPINEPHRINE 4 MG/250ML-% IV SOLN: 2.0000 ug/min | INTRAVENOUS | Status: DC

## 2020-01-18 MED ORDER — POTASSIUM CHLORIDE 20 MEQ PO PACK
20.0000 meq | PACK | Freq: Once | ORAL | Status: AC
  Administered 2020-01-18: 20 meq via ORAL
  Filled 2020-01-18: qty 1

## 2020-01-18 MED ORDER — APIXABAN 5 MG PO TABS
5.0000 mg | ORAL_TABLET | Freq: Two times a day (BID) | ORAL | Status: DC
  Administered 2020-01-18 – 2020-01-30 (×26): 5 mg via ORAL
  Filled 2020-01-18 (×26): qty 1

## 2020-01-18 NOTE — ED Notes (Signed)
Per lab- previous covid swab can be changed to 2hr test reflected in new order. No need to reswab patient.

## 2020-01-18 NOTE — Progress Notes (Signed)
Pharmacy Electrolyte Monitoring Consult:  Pharmacy consulted to assist in monitoring and replacing electrolytes in this 69 y.o. male admitted on 2020-02-01 with hypoxic and hypercapnic respiratory failure from bilateral pneumonia. Patient with past medical history significant for Hypertension, Diastolic CHF, Bipolar Disorder, and Anxiety.  Labs:  Sodium (mmol/L)  Date Value  01/18/2020 131 (L)  12/05/2013 142   Potassium (mmol/L)  Date Value  01/18/2020 3.4 (L)  12/05/2013 3.1 (L)   Magnesium (mg/dL)  Date Value  11/65/7903 1.6 (L)  12/05/2013 1.8   Phosphorus (mg/dL)  Date Value  83/33/8329 3.2   Calcium (mg/dL)  Date Value  19/16/6060 7.9 (L)   Calcium, Total (mg/dL)  Date Value  04/59/9774 8.4 (L)   Albumin (g/dL)  Date Value  14/23/9532 2.7 (L)  12/01/2013 2.7 (L)    Assessment/Plan: Patient received magnesium 2g IV x 1 and total of of IV potassium.   Patient ordered LR bolus on am rounds and started on LR at 68mL/hr.   Will order additional magnesium 2g IV x 1 for total replacement for 4g. Will order additional potassium PO x 1.   Will replace for goal potassium ~ 4 and goal magnesium ~ 2.   Electrolytes with am labs.   Pharmacy will continue to monitor and adjust per consult.   Tynesia Harral L 01/18/2020 2:01 PM

## 2020-01-18 NOTE — Consult Note (Signed)
Name: Dwayne Bridges MRN: [id number] DOB: Apr 14, 1951    ADMISSION DATE:  02/07/2020 CONSULTATION DATE: 01/18/2020  REFERRING MD : Webb Silversmith, NP  CHIEF COMPLAINT: Hypotension   BRIEF PATIENT DESCRIPTION:  69 yo male admitted with hypotension likely secondary to hypovolemic shock secondary to dehydration/overdiuresis and possible sepsis with possible bilateral lower lobe pneumonia vs. partial compressive atelectasis   SIGNIFICANT EVENTS/STUDIES:  03/7: Pt admitted to the stepdown unit with hypotension 03/7: CTA Chest revealed no CT evidence of central pulmonary artery embolus. Moderate cardiomegaly with findings of right heart dysfunction. Correlation with echocardiogram recommended. Small bilateral pleural effusions with bilateral lower lobe partial compressive atelectasis versus pneumonia. Clinical correlation is recommended. Aortic Atherosclerosis  03/8: Pts status changed to ICU due to continue hypotension despite aggressive fluid resuscitation   HISTORY OF PRESENT ILLNESS:   This is a 69 yo male with a PMH of OSA, Hypokalemia, HTN, GERD, Acute on Chronic Diastolic CHF, Disorientation, Bipolar Disorder, and Anxiety.  He presented to [facility] ER on 03/7 from [facility] via EMS with hypotension.  Per ER notes [facility] staff reported pts bp 56/33, however EMS reported bp 88/41 he received 500 ml NS bolus en route to the ER.  He was recently discharged from [facility] on 01/12/2020 following treatment of acute on chronic diastolic CHF exacerbation, and discharged on 40 mg po bid lasix.  During current ER presentation pt remained hypotensive sbp 70-90's.  Therefore, he was fluid resuscitated with 3 liters of NS with slight improvement of bp.  Lab results revealed Na+ 128, K+ 3.2, chloride 73, CO2 39, glucose 113, BUN 45, creatinine 1.82, anion gap 16, lactic acid 3.4, pct 0.16, hgb 11.5, and UA negative.  CXR, Influenza PCR, and COVID-19 negative.  CTA Chest negative for PE, however concerning  for moderate cardiomegaly with findings of right heart dysfunction, small pleural effusions with bilateral lower lobe partial compressive atelectasis vs. pneumonia.  Sepsis protocol initiated and pt received vancomycin and cefepime. He was subsequently admitted to the stepdown unit per hospitalist team for additional workup and treatment.  However, due to continued hypotension pt transferred to ICU for neo-synephrine gtt.    PAST MEDICAL HISTORY :   has a past medical history of Anxiety, Bipolar disorder, unspecified (HCC), CHF (congestive heart failure) (HCC), Disorientation, unspecified, GERD (gastroesophageal reflux disease), Hypertension, Hypokalemia, and Sleep apnea, unspecified.  has no past surgical history on file. Prior to Admission medications   Medication Sig Start Date End Date Taking? Authorizing Provider  acetaminophen (TYLENOL) 325 MG tablet Take 650 mg by mouth every 6 (six) hours as needed (leg pain).   Yes [provider]  apixaban (ELIQUIS) 5 MG TABS tablet Take 1 tablet (5 mg total) by mouth 2 (two) times daily. 03/31/19  Yes Leroy Sea, MD  atorvastatin (LIPITOR) 20 MG tablet Take 20 mg by mouth at bedtime.    Yes [provider]  diltiazem (DILACOR XR) 180 MG 24 hr capsule Take 180 mg by mouth daily.   Yes [provider]  divalproex (DEPAKOTE SPRINKLE) 125 MG capsule Take 375 mg by mouth 2 (two) times daily.   Yes [provider]  finasteride (PROSCAR) 5 MG tablet Take 5 mg by mouth daily.   Yes [provider]  furosemide (LASIX) 40 MG tablet Take 1 tablet (40 mg total) by mouth 2 (two) times daily. 03/31/19  Yes Leroy Sea, MD  ipratropium-albuterol (DUONEB) 0.5-2.5 (3) MG/3ML SOLN Take 3 mLs by nebulization every  6 (six) hours as needed (wheezing, shortness of breath). 07/03/19  Yes Enid Baas, MD  lisinopril (ZESTRIL) 2.5 MG tablet Take 1 tablet (2.5 mg total) by mouth daily. 01/13/20 02/12/20 Yes Sreenath, Sudheer  B, MD  Multiple Vitamin (MULTIVITAMIN) tablet Take 1 tablet by mouth daily.   Yes [provider]  potassium chloride (K-DUR) 20 MEQ tablet Take 1 tablet (20 mEq total) by mouth daily. 03/31/19  Yes Leroy Sea, MD  sennosides-docusate sodium (SENOKOT-S) 8.6-50 MG tablet Take 1 tablet by mouth 2 (two) times daily.   Yes [provider]  terazosin (HYTRIN) 10 MG capsule Take 10 mg by mouth at bedtime.   Yes [provider]   No Known Allergies  FAMILY HISTORY:  family history includes Hypertension in an other family member. SOCIAL HISTORY:  reports that he quit smoking about 11 years ago. His smoking use included cigarettes. He has a 5.00 pack-year smoking history. He has never used smokeless tobacco. He reports that he does not drink alcohol.  REVIEW OF SYSTEMS:   Unable to assess pt confused   SUBJECTIVE:  Unable to assess pt confused   VITAL SIGNS: Temp:  [97.7 F (36.5 C)-98.6 F (37 C)] 98.6 F (37 C) (03/08 0105) Pulse Rate:  [56-82] 71 (03/08 0300) Resp:  [12-26] 14 (03/08 0300) BP: (72-184)/(49-174) 86/52 (03/08 0300) SpO2:  [91 %-100 %] 100 % (03/08 0300) Weight:  [99.6 kg-101.3 kg] 101.3 kg (03/08 0100)  PHYSICAL EXAMINATION: General: chronically ill appearing male, NAD resting in bed Neuro: alert, confused to time and situation, follows commands  HEENT: supple, no JVD  Cardiovascular: irregular irregular, no R/G, 1+ distal pulses via doppler   Lungs: clear throughout, even, non labored  Abdomen: +BS x4, obese, soft, non tender, mild distension Musculoskeletal: trace generalized edema  Skin: left great toe venous ulcerations   Recent Labs  Lab 01/11/20 0610 01/12/20 0641 30-Jan-2020 2117  NA 135 133* 128*  K 3.2* 2.9* 3.2*  CL 74* 71* 73*  CO2 48* 48* 39*  BUN 12 17 45*  CREATININE 0.56* 0.76 1.82*  GLUCOSE 101* 107* 113*   Recent Labs  Lab 01-30-20 2117  HGB 11.5*  HCT 34.8*  WBC 9.9  PLT 201   DG Abdomen 1  View  Result Date: 01/18/2020 CLINICAL DATA:  Abdominal distension EXAM: ABDOMEN - 1 VIEW COMPARISON:  None. FINDINGS: The bowel gas pattern is nonspecific with mild gaseous distention of loops of colon and small bowel scattered throughout the abdomen. The stool burden is average. There are no radiopaque kidney stones. The bladder is moderately distended. There is no evidence for an acute displaced fracture IMPRESSION: Nonspecific bowel gas pattern with mild gaseous distention of loops of colon and small bowel scattered throughout the abdomen. Electronically Signed   By: Katherine Mantle M.D.   On: 01/18/2020 00:31   CT Angio Chest PE W and/or Wo Contrast  Result Date: 2020-01-30 CLINICAL DATA:  69 year old male with concern for pulmonary embolism. EXAM: CT ANGIOGRAPHY CHEST WITH CONTRAST TECHNIQUE: Multidetector CT imaging of the chest was performed using the standard protocol during bolus administration of intravenous contrast. Multiplanar CT image reconstructions and MIPs were obtained to evaluate the vascular anatomy. CONTRAST:  70mL OMNIPAQUE IOHEXOL 350 MG/ML SOLN COMPARISON:  Chest radiograph dated 2020/01/30. FINDINGS: Evaluation of this exam is limited due to respiratory motion artifact. Cardiovascular: There is moderate cardiomegaly. No significant pericardial effusion. There is advanced 3 vessel coronary vascular calcification and calcification of the mitral annulus. There  is retrograde flow of contrast from the right atrium into the IVC indicative of right heart dysfunction. Correlation with echocardiogram recommended. There is mild atherosclerotic calcification of the thoracic aorta. No aneurysmal dilatation or dissection. Evaluation of the pulmonary arteries is limited due to respiratory motion artifact and suboptimal opacification of the peripheral branches. No large central pulmonary artery embolus identified. Mediastinum/Nodes: Mildly enlarged right hilar lymph nodes measure up to 17 mm. The  esophagus is grossly unremarkable. No mediastinal fluid collection. Lungs/Pleura: Small right and trace left pleural effusions. There are bilateral lower lobe partial compressive atelectasis versus pneumonia. Clinical correlation is recommended. There is no pneumothorax. The central airways are patent. Upper Abdomen: Probable sludge within the gallbladder. Colonic diverticulosis. Musculoskeletal: Osteopenia with degenerative changes of the spine. No acute osseous pathology. Review of the MIP images confirms the above findings. IMPRESSION: 1. No CT evidence of central pulmonary artery embolus. 2. Moderate cardiomegaly with findings of right heart dysfunction. Correlation with echocardiogram recommended. 3. Small bilateral pleural effusions with bilateral lower lobe partial compressive atelectasis versus pneumonia. Clinical correlation is recommended. 4. Aortic Atherosclerosis (ICD10-I70.0). Electronically Signed   By: Anner Crete M.D.   On: 02/07/20 23:34   DG Chest Port 1 View  Result Date: 2020/02/07 CLINICAL DATA:  69 year old male with hypotension and hypoxia. EXAM: PORTABLE CHEST 1 VIEW COMPARISON:  Portable chest 01/07/2020 and earlier. FINDINGS: Portable AP view at 2150 hours. Continued low lung volumes. Stable cardiac size and mediastinal contours. Visualized tracheal air column is within normal limits. Decreased veiling opacity and/or vascular congestion compared to last month. Allowing for portable technique the lungs are clear. No pneumothorax. Negative visible bowel gas pattern. No acute osseous abnormality identified. IMPRESSION: Low lung volumes but improved ventilation since last month. No acute cardiopulmonary abnormality. Electronically Signed   By: Genevie Ann M.D.   On: 02/07/2020 22:03    ASSESSMENT / PLAN:  Hypotension likely to dehydration/overdiuresis and possible sepsis  Hx: HTN, HLD, Chronic atrial fibrillation, and Chronic diastolic CHF  Continuous telemetry monitoring  Gentle  fluid resuscitation, and if pt remains hypotensive will start neo-synephrine gtt to maintain map >65 Hold outpatient antihypertensives  Hold outpatient lasix for now  Continue outpatient apixaban and atorvastatin   Acute renal failure and hyponatremia secondary to hypovolemic shock  Lactic acidosis-resolved  Trend BMP Replace electrolytes as indicated  Monitor UOP Avoid nephrotoxic medications   Bilateral lower lobe pneumonia vs. partial compressive atelectasis  Hx: Chronic home O2 @2L  and OSA Supplemental O2 for dyspnea and/or hypoxia  Prn bronchodilator therapy  Aggressive pulmonary hygiene  Trend WBC and monitor fever curve Trend PCT  Continue cefepime   Bipolar Disorder Anxiety Continue outpatient depakote   Marda Stalker, Leonard Pager [phone] (please enter 7 digits) PCCM Consult Pager [phone] (please enter 7 digits)

## 2020-01-18 NOTE — Progress Notes (Addendum)
    BRIEF OVERNIGHT PROGRESS REPORT  Patient still hypotensive despite bolus, current BP 86/58 mm Hg with map (68). Patient asymptomatic. Given hx of recent CHF exacerbation requiring diuresis, will consider adding pressors for BP optimization. PCCM has been consulted.    Webb Silversmith, DNP, CCRN, FNP-C [facility] Nurse Practitioner Between 7pm to 7am - Pager [phone]  After 7am go to [email or web address] - [email or web address]  [employer]  [phone]

## 2020-01-18 NOTE — H&P (Signed)
Dwayne Bridges:[id number] DOB: Apr 19, 1951 DOA: 01/16/2020     PCP: Patient, [name] Per   Outpatient Specialists:   CARDS:  Dr. Zonia Kief   Patient arrived to ER on 01/12/2020 at 2106    From facility [facility]  Chief Complaint:   Chief Complaint  Patient presents with  . Hypotension    HPI: Dwayne Bridges is a 68 y.o. male with medical history significant of hypertension, hyperlipidemia, GERD, anxiety,dCHF, bipolar disorder, COVID-19 infection May/2020, atrial fibrillation on Eliquis    Presented with hypotension and decreased p.o. intake Recently was admitted for CHF exacerbation and diuresed At baseline pt is alert, orientated x3 but has social deficits per family likely has autism.  Patient was diuresed and at the time of discharge on 2 March was down to 2 L nasal cannula and -4.8 L.  During hospital stay was noted to have troponin elevation up to 85 thought to be likely secondary to demand ischemia secondary to CHF exacerbation. Given unclear mental status change his had a CT of the head which was unremarkable.  he was able to be discharged to SNF.  Unfortunately there after he continued to decline decreased p.o. intake and decreased urine output.  He was noted to be significantly hypotensive down to 50s EMS was called and he was brought back to emergency department   Infectious risk factors:  Reports none     in house  PCR testing  Pending  Lab Results  Component Value Date   SARSCOV2NAA NEGATIVE 01/12/2020   SARSCOV2NAA NEGATIVE 01/07/2020   SARSCOV2NAA NEGATIVE 06/30/2019     Regarding pertinent Chronic problems:     Hyperlipidemia -  on statins Lipitor   HTN on Cardizem recently added lisinopril   chronic CHF diastolic/systolic/ combined - last echo 01/08/2020 EF 60 to 65%.mild left ventricular hypertrophy.  Left ventricular diastolic parameters  are indeterminate.      obesity-   BMI Readings from Last 1 Encounters:  02/08/2020 30.63 kg/m          COPD -was recently discharged on 2L     A. 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  Blood pressure now up to 90s.  Patient mental status is improved.  He remains on 2 L.  Patient is alert and oriented to self.  Knows that he is in the hospital. Work-up showed no evidence of infectious source.  Noted to have AKI  The following Work up has been ordered so far:  Orders Placed This Encounter  Procedures  . Critical Care  . Blood Culture (routine x 2)  . Urine culture  . SARS CORONAVIRUS 2 (TAT 6-24 HRS) Nasopharyngeal Nasopharyngeal Swab  . DG Chest Port 1 View  . CT Angio Chest PE W and/or Wo Contrast  . DG Abdomen 1 View  . Lactic acid, plasma  . Comprehensive metabolic panel  . CBC WITH DIFFERENTIAL  . APTT  . Protime-INR  . Urinalysis, Routine w reflex microscopic  . Diet NPO time specified  . Cardiac monitoring  . Refer to Sidebar Report:  Sepsis Sidebar ED/IP  . Document vital signs within 1-hour of fluid bolus completion and notify provider of bolus completion  . Document height and weight  . Insert peripheral IV x 2  . Initiate Carrier Fluid Protocol  . Initiate Code Sepsis ([facility] [phone])  When activated, this will prioritize pharmacy, lab, and radiology services for this patient for STAT collections and interventions.  . pharmacy consult  . Consult to hospitalist  . Pulse oximetry, continuous  . EKG 12-Lead  . ED EKG 12-Lead    Following Medications were ordered in ER: Medications  sodium chloride 0.9 % bolus 1,000 mL (0 mLs Intravenous Stopped 01/15/2020 2229)    And  sodium chloride 0.9 % bolus 1,000 mL (1,000 mLs  Intravenous New Bag/Given 02/05/2020 2232)    And  sodium chloride 0.9 % bolus 1,000 mL (has no administration in time range)  vancomycin (VANCOREADY) IVPB 2000 mg/400 mL (2,000 mg Intravenous New Bag/Given 02/06/2020 2212)  ceFEPIme (MAXIPIME) 2 g in sodium chloride 0.9 % 100 mL IVPB (0 g Intravenous Stopped 02/07/2020 2219)  iohexol (OMNIPAQUE) 350 MG/ML injection 75 mL (75 mLs Intravenous Contrast Given 01/20/2020 2301)        Consult Orders  (From admission, onward)         Start     Ordered   01/12/2020 2336  Consult to hospitalist  Once    Provider:  (Not yet assigned)  Question Answer Comment  Place call to: ED, callback [phone]   Reason for Consult Admit   Diagnosis/Clinical Info for Consult: sepsis      01/20/2020 2335          Significant initial  Findings: Abnormal Labs Reviewed  LACTIC ACID, PLASMA - Abnormal; Notable for the following components:      Result Value   Lactic Acid, Venous 3.5 (*)    All other components within normal limits  COMPREHENSIVE METABOLIC PANEL - Abnormal; Notable for the following components:   Sodium 128 (*)    Potassium 3.2 (*)    Chloride 73 (*)    CO2 39 (*)    Glucose, Bld 113 (*)    BUN 45 (*)    Creatinine, Ser 1.82 (*)    Calcium 8.1 (*)    Albumin 2.7 (*)    GFR calc non Af Amer 37 (*)    GFR calc Af Amer 43 (*)    Anion gap 16 (*)    All other components within normal limits  CBC WITH DIFFERENTIAL/PLATELET - Abnormal; Notable for the following components:   RBC 3.81 (*)    Hemoglobin 11.5 (*)    HCT 34.8 (*)    All other components within normal limits  PROTIME-INR - Abnormal; Notable for the following components:   Prothrombin Time 18.8 (*)    INR 1.6 (*)    All other components within normal limits  URINALYSIS, ROUTINE W REFLEX MICROSCOPIC - Abnormal; Notable for the following components:   Color, Urine YELLOW (*)    APPearance CLEAR (*)    All other components within normal limits     Otherwise labs showing:    Recent  Labs  Lab 01/11/20 0610 01/12/20 0641 02/06/2020 2117  NA 135 133* 128*  K 3.2* 2.9* 3.2*  CO2 48* 48* 39*  GLUCOSE 101* 107* 113*  BUN 12 17 45*  CREATININE 0.56* 0.76 1.82*  CALCIUM 9.1 9.0 8.1*  MG 2.0 2.0  --     Cr   Up from baseline see below  Lab Results  Component Value Date   CREATININE 1.82 (H) 02-08-20   CREATININE 0.76 01/12/2020   CREATININE 0.56 (L) 01/11/2020    Recent Labs  Lab February 08, 2020 2117  AST 26  ALT 15  ALKPHOS 63  BILITOT 0.9  PROT 6.7  ALBUMIN 2.7*   Lab Results  Component Value Date   CALCIUM 8.1 (L) Feb 08, 2020     WBC      Component Value Date/Time   WBC 9.9 08-Feb-2020 2117   ANC    Component Value Date/Time   NEUTROABS 7.7 02/08/2020 2117   NEUTROABS 5.8 12/02/2013 0545   ALC No components found for: LYMPHAB   Plt: Lab Results  Component Value Date   PLT 201 2020/02/08    Lactic Acid, Venous    Component Value Date/Time   LATICACIDVEN 3.5 (HH) 02-08-2020 2117    Procalcitonin   Ordered   COVID-19 Labs  No results for input(s): DDIMER, FERRITIN, LDH, CRP in the last 72 hours.  Lab Results  Component Value Date   SARSCOV2NAA NEGATIVE 01/12/2020   SARSCOV2NAA NEGATIVE 01/07/2020   SARSCOV2NAA NEGATIVE 06/30/2019    HG/HCT  stable,       Component Value Date/Time   HGB 11.5 (L) February 08, 2020 2117   HGB 11.6 (L) 12/02/2013 0545   HCT 34.8 (L) 2020/02/08 2117   HCT 35.2 (L) 12/02/2013 0545      ECG: Ordered Personally reviewed by me showing: HR : 68 Rhythm:    A.fib.   no evidence of ischemic changes [id number]   BNP (last 3 results) Recent Labs    03/30/19 0306 06/30/19 0906 01/07/20 1259  BNP 98.1 168.0* 240.0*    ProBNP (last 3 results) No results for input(s): PROBNP in the last 8760 hours.  DM  labs:  HbA1C: Recent Labs    01/08/20 0438  HGBA1C 5.9*        UA   no evidence of UTI     Urine analysis:    Component Value Date/Time   COLORURINE YELLOW (A) 02-08-2020 2154   APPEARANCEUR  CLEAR (A) February 08, 2020 2154   APPEARANCEUR Clear 03/25/2014 2000   LABSPEC 1.006 02-08-20 2154   LABSPEC 1.016 03/25/2014 2000   PHURINE 7.0 02/08/20 2154   GLUCOSEU NEGATIVE Feb 08, 2020 2154   GLUCOSEU Negative 03/25/2014 2000   HGBUR NEGATIVE 02-08-20 2154   BILIRUBINUR NEGATIVE 2020-02-08 2154   BILIRUBINUR Negative 03/25/2014 2000   KETONESUR NEGATIVE 08-Feb-2020 2154   PROTEINUR NEGATIVE 02-08-20 2154   NITRITE NEGATIVE February 08, 2020 2154   LEUKOCYTESUR NEGATIVE February 08, 2020 2154   LEUKOCYTESUR Negative 03/25/2014 2000    Ordered    CXR -  NON acute   CTA chest -  nonacute, no PE, atelectasis vs PNA     ED Triage Vitals  Enc Vitals Group     BP 02-08-20 2111 (!) 140/99     Pulse Rate 02/08/20 2111 64     Resp 08-Feb-2020 2111 12     Temp 02-08-2020 2154 97.7 F (36.5 C)     Temp Source 02-08-2020 2154 Oral     SpO2 2020-02-08 2111 94 %     Weight 02/08/20 2112 219 lb 9.6 oz (99.6 kg)     Height February 08, 2020 2112 5\' 11"  (1.803 m)     Head Circumference --      Peak Flow --      Pain Score 2020-02-08 2112 0     Pain Loc --      Pain Edu? --  Excl. in GC? --   TMAX(24)@       Latest  Blood pressure (!) 99/54, pulse 65, temperature 97.7 F (36.5 C), temperature source Oral, resp. rate 16, height  (1.803 m), weight 99.6 kg, SpO2 100 %.    Hospitalist was called for admission for dehydration   Review of Systems:    Pertinent positives include: decreased PO inake  Constitutional:  No weight loss, night sweats, Fevers, chills, fatigue, weight loss  HEENT:  No headaches, Difficulty swallowing,Tooth/dental problems,Sore throat,  No sneezing, itching, ear ache, nasal congestion, post nasal drip,  Cardio-vascular:  No chest pain, Orthopnea, PND, anasarca, dizziness, palpitations.no Bilateral lower extremity swelling  GI:  No heartburn, indigestion, abdominal pain, nausea, vomiting, diarrhea, change in bowel habits, loss of appetite, melena, blood in stool,  hematemesis Resp:  no shortness of breath at rest. No dyspnea on exertion, No excess mucus, no productive cough, No non-productive cough, No coughing up of blood.No change in color of mucus.No wheezing. Skin:  no rash or lesions. No jaundice GU:  no dysuria, change in color of urine, no urgency or frequency. No straining to urinate.  No flank pain.  Musculoskeletal:  No joint pain or no joint swelling. No decreased range of motion. No back pain.  Psych:  No change in mood or affect. No depression or anxiety. No memory loss.  Neuro: no localizing neurological complaints, no tingling, no weakness, no double vision, no gait abnormality, no slurred speech, no confusion  All systems reviewed and apart from HOPI all are negative  Past Medical History:   Past Medical History:  Diagnosis Date  . Anxiety   . Bipolar disorder, unspecified (HCC)   . CHF (congestive heart failure) (HCC)   . Disorientation, unspecified   . GERD (gastroesophageal reflux disease)   . Hypertension   . Hypokalemia   . Sleep apnea, unspecified     No past surgical history on file.  Social History:     reports that he quit smoking about 11 years ago. His smoking use included cigarettes. He has a 5.00 pack-year smoking history. He has never used smokeless tobacco. No history on file for alcohol and drug.   Family History:   Family History  Problem Relation Age of Onset  . Hypertension Other     Allergies: No Known Allergies   Prior to Admission medications   Medication Sig Start Date End Date Taking? Authorizing Provider  acetaminophen (TYLENOL) 325 MG tablet Take 650 mg by mouth every 6 (six) hours as needed (leg pain).   Yes [provider]  apixaban (ELIQUIS) 5 MG TABS tablet Take 1 tablet (5 mg total) by mouth 2 (two) times daily. 03/31/19  Yes Leroy Sea, MD  atorvastatin (LIPITOR) 20 MG tablet Take 20 mg by mouth at bedtime.    Yes [provider]  diltiazem (DILACOR XR)  180 MG 24 hr capsule Take 180 mg by mouth daily.   Yes [provider]  divalproex (DEPAKOTE SPRINKLE) 125 MG capsule Take 375 mg by mouth 2 (two) times daily.   Yes [provider]  finasteride (PROSCAR) 5 MG tablet Take 5 mg by mouth daily.   Yes [provider]  furosemide (LASIX) 40 MG tablet Take 1 tablet (40 mg total) by mouth 2 (two) times daily. 03/31/19  Yes Leroy Sea, MD  ipratropium-albuterol (DUONEB) 0.5-2.5 (3) MG/3ML SOLN Take 3 mLs by nebulization every 6 (six) hours as needed (wheezing, shortness of breath). 07/03/19  Yes  Enid BaasKalisetti, Radhika, MD  lisinopril (ZESTRIL) 2.5 MG tablet Take 1 tablet (2.5 mg total) by mouth daily. 01/13/20 02/12/20 Yes Sreenath, Sudheer B, MD  Multiple Vitamin (MULTIVITAMIN) tablet Take 1 tablet by mouth daily.   Yes [provider]  potassium chloride (K-DUR) 20 MEQ tablet Take 1 tablet (20 mEq total) by mouth daily. 03/31/19  Yes Leroy SeaSingh, Prashant K, MD  sennosides-docusate sodium (SENOKOT-S) 8.6-50 MG tablet Take 1 tablet by mouth 2 (two) times daily.   Yes [provider]  terazosin (HYTRIN) 10 MG capsule Take 10 mg by mouth at bedtime.   Yes [provider]   Physical Exam: Blood pressure (!) 99/54, pulse 65, temperature 97.7 F (36.5 C), temperature source Oral, resp. rate 16, height 5\' 11"  (1.803 m), weight 99.6 kg, SpO2 100 %. 1. General:  in No  Acute distress   Chronically ill  -appearing 2. Psychological: Alert and  Oriented to self and place 3. Head/ENT:     Dry Mucous Membranes                          Head Non traumatic, neck supple                           Poor Dentition 4. SKIN:   decreased Skin turgor,  Skin clean Dry and intact no rash 5. Heart: Regular rate and rhythm no Murmur, no Rub or gallop 6. Lungs:  no wheezes or crackles   7. Abdomen: Soft,  non-tender,  distended  obese  bowel sounds present 8. Lower extremities: no clubbing, cyanosis, no  edema 9. Neurologically  Grossly intact, moving all 4 extremities equally   10. MSK: Normal range of motion   All other LABS:     Recent Labs  Lab 01/21/2020 2117  WBC 9.9  NEUTROABS 7.7  HGB 11.5*  HCT 34.8*  MCV 91.3  PLT 201     Recent Labs  Lab 01/11/20 0610 01/12/20 0641 02/03/2020 2117  NA 135 133* 128*  K 3.2* 2.9* 3.2*  CL 74* 71* 73*  CO2 48* 48* 39*  GLUCOSE 101* 107* 113*  BUN 12 17 45*  CREATININE 0.56* 0.76 1.82*  CALCIUM 9.1 9.0 8.1*  MG 2.0 2.0  --      Recent Labs  Lab 01/30/2020 2117  AST 26  ALT 15  ALKPHOS 63  BILITOT 0.9  PROT 6.7  ALBUMIN 2.7*       Cultures:    Component Value Date/Time   SDES  06/30/2019 0906    BLOOD RIGHT ANTECUBITAL Performed at [facility], [address] [place], [place] [address]    [facility] 06/30/2019 0906   SPECREQUEST  06/30/2019 0906    BOTTLES DRAWN AEROBIC AND ANAEROBIC Blood Culture results may not be optimal due to an excessive volume of blood received in culture bottles Performed at [facility], [address] [place], [place] [address]    [facility]  06/30/2019 [id number]    BOTTLES DRAWN AEROBIC AND ANAEROBIC Blood Culture adequate volume   CULT (A) 06/30/2019 0906    STAPHYLOCOCCUS SPECIES (COAGULASE NEGATIVE) THE SIGNIFICANCE OF ISOLATING THIS ORGANISM FROM A SINGLE SET OF BLOOD CULTURES WHEN MULTIPLE SETS ARE DRAWN IS UNCERTAIN. PLEASE NOTIFY THE MICROBIOLOGY DEPARTMENT WITHIN ONE WEEK IF SPECIATION AND SENSITIVITIES ARE REQUIRED. Performed at [facility], [address] [place], [place] [address]  CULT  06/30/2019 0906    NO GROWTH 5 DAYS Performed at [facility], [address] [place], [place] [address]    REPTSTATUS 07/02/2019 FINAL 06/30/2019 0175   REPTSTATUS 07/05/2019 FINAL 06/30/2019 1025     Radiological Exams on Admission: CT Angio Chest PE W and/or Wo Contrast  Result Date: 2020-01-30 CLINICAL DATA:  69 year old male with concern for pulmonary embolism.  EXAM: CT ANGIOGRAPHY CHEST WITH CONTRAST TECHNIQUE: Multidetector CT imaging of the chest was performed using the standard protocol during bolus administration of intravenous contrast. Multiplanar CT image reconstructions and MIPs were obtained to evaluate the vascular anatomy. CONTRAST:  36mL OMNIPAQUE IOHEXOL 350 MG/ML SOLN COMPARISON:  Chest radiograph dated 2020-01-30. FINDINGS: Evaluation of this exam is limited due to respiratory motion artifact. Cardiovascular: There is moderate cardiomegaly. No significant pericardial effusion. There is advanced 3 vessel coronary vascular calcification and calcification of the mitral annulus. There is retrograde flow of contrast from the right atrium into the IVC indicative of right heart dysfunction. Correlation with echocardiogram recommended. There is mild atherosclerotic calcification of the thoracic aorta. No aneurysmal dilatation or dissection. Evaluation of the pulmonary arteries is limited due to respiratory motion artifact and suboptimal opacification of the peripheral branches. No large central pulmonary artery embolus identified. Mediastinum/Nodes: Mildly enlarged right hilar lymph nodes measure up to 17 mm. The esophagus is grossly unremarkable. No mediastinal fluid collection. Lungs/Pleura: Small right and trace left pleural effusions. There are bilateral lower lobe partial compressive atelectasis versus pneumonia. Clinical correlation is recommended. There is no pneumothorax. The central airways are patent. Upper Abdomen: Probable sludge within the gallbladder. Colonic diverticulosis. Musculoskeletal: Osteopenia with degenerative changes of the spine. No acute osseous pathology. Review of the MIP images confirms the above findings. IMPRESSION: 1. No CT evidence of central pulmonary artery embolus. 2. Moderate cardiomegaly with findings of right heart dysfunction. Correlation with echocardiogram recommended. 3. Small bilateral pleural effusions with bilateral  lower lobe partial compressive atelectasis versus pneumonia. Clinical correlation is recommended. 4. Aortic Atherosclerosis (ICD10-I70.0). Electronically Signed   By: Anner Crete M.D.   On: 01-30-20 23:34   DG Chest Port 1 View  Result Date: January 30, 2020 CLINICAL DATA:  69 year old male with hypotension and hypoxia. EXAM: PORTABLE CHEST 1 VIEW COMPARISON:  Portable chest 01/07/2020 and earlier. FINDINGS: Portable AP view at 2150 hours. Continued low lung volumes. Stable cardiac size and mediastinal contours. Visualized tracheal air column is within normal limits. Decreased veiling opacity and/or vascular congestion compared to last month. Allowing for portable technique the lungs are clear. No pneumothorax. Negative visible bowel gas pattern. No acute osseous abnormality identified. IMPRESSION: Low lung volumes but improved ventilation since last month. No acute cardiopulmonary abnormality. Electronically Signed   By: Genevie Ann M.D.   On: January 30, 2020 22:03    Chart has been reviewed    Assessment/Plan  69 y.o. male with medical history significant of hypertension, hyperlipidemia, GERD, anxiety,dCHF, bipolar disorder, COVID-19 infection May/2020, atrial fibrillation on Eliquis Admitted for dehydration and AKi  Present on Admission: . AKI (acute kidney injury) ([place]) most likely secondary to dehydration obtain urine electrolytes given abdominal distention will obtain bladder scan if any evidence of postvoid residual will need Foley catheter placement DC lisinopril   . HTN (hypertension) -hold home medications given significant hypotension   . BPH (benign prostatic hyperplasia) -given hypotension hold home medications resume when able   . HLD (hyperlipidemia)  Continue Lipitor   . Atrial fibrillation, chronic (HCC) resume diltiazem when able to tolerate  but hold off for tonight continue Eliquis   . Bipolar disorder, unspecified (HCC) continue Depakote but check level   . Dehydration  rehydrate gently given recent CHF exacerbation   Chronic diastolic CHF -appears to be on the dry side hold Lasix hold lisinopril gently rehydrate and monitor   . Hypotension-in the setting of    recent diuresis and decreased p.o. intake Improved with IV fluid rehydration no fevers or elevated white blood cell count to suggest infectious etiology blood cultures obtained.  Patient was given initial antibiotics in the emergency department will hold off for now and continue to evaluate   . Hypokalemia -replace and check magnesium level   . Abdominal distension -check KUB and bladder scan   . Hyponatremia -in the setting of decreased p.o. intake appears to be slightly dehydrated with AKI.  Obtain urine electrolytes rehydrate and follow   Other plan as per orders.  DVT prophylaxis:  Eliquis   Code Status:     DNR/DNI  as per  family per ER provider discussion    Family Communication:   Family not at  Bedside   Disposition Plan:                              Back to current facility when stable  BP improves and AKI resolves                                              Would benefit from PT/OT eval prior to DC  Ordered                                       Social Work  consulted                   Nutrition    consulted                                 Consults called: none  Admission status:  ED Disposition    ED Disposition Condition Comment   Admit  Hospital Area: [facility] [[id number]]  Level of Care: Stepdown [14]  Covid Evaluation: Asymptomatic Screening Protocol (No Symptoms)  Diagnosis: AKI (acute kidney injury) [facility]) [[id number]]  Admitting Physician: Therisa Doyne [[id number]]  Attending Physician: Therisa Doyne [[id number]]       Obs       Level of care          SDU tele indefinitely please discontinue once patient no longer qualifies   Precautions: admitted as asymptomatic screening protocol   PPE: Used by the provider:   P100  eye Goggles,   Gloves    Dwayne Bridges 01/18/2020, 12:34 AM    [facility]     after 2 AM please page floor coverage PA If 7AM-7PM, please contact the day team taking care of the patient using [email or web address]   Patient was evaluated in the context of the global COVID-19 pandemic, which necessitated consideration that the patient might be at risk for infection with the SARS-CoV-2 virus that causes COVID-19. Institutional protocols and algorithms that pertain to the evaluation of patients at risk for COVID-19 are in a state of rapid  change based on information released by regulatory bodies including the CDC and federal and state organizations. These policies and algorithms were followed during the patient's care.

## 2020-01-18 NOTE — Progress Notes (Signed)
Pharmacy Antibiotic Note  Dwayne Bridges is a 69 y.o. male admitted on 02/01/2020 with pneumonia.  Pharmacy has been consulted for Cefepime dosing.  Plan: Cefepime 2gm IV q12hrs (renally adjusted)  Height: 5\' 11"  (180.3 cm) Weight: 223 lb 5.2 oz (101.3 kg) IBW/kg (Calculated) : 75.3  Temp (24hrs), Avg:98.2 F (36.8 C), Min:97.7 F (36.5 C), Max:98.6 F (37 C)  Recent Labs  Lab 01/11/20 0610 01/12/20 0641 01/30/2020 2117 01/18/2020 2339 01/18/20 0157 01/18/20 0410  WBC  --   --  9.9  --   --  10.3  CREATININE 0.56* 0.76 1.82*  --   --   --   LATICACIDVEN  --   --  3.5* 1.3 1.0 1.9    Estimated Creatinine Clearance: 45.8 mL/min (A) (by C-G formula based on SCr of 1.82 mg/dL (H)).    No Known Allergies  Antimicrobials this admission:  >>   >>   Dose adjustments this admission:   Microbiology results:  BCx:   UCx:    Sputum:    MRSA PCR:   Thank you for allowing pharmacy to be a part of this patient's care.  03/19/20 A 01/18/2020 5:07 AM

## 2020-01-18 NOTE — TOC Progression Note (Signed)
Transition of Care [facility]) - Progression Note    Patient Details  Name: Dwayne Bridges MRN: [id number] Date of Birth: Aug 17, 1951  [facility] CM/SW Contact  Liliana Cline, LCSW Phone Number: 01/18/2020, 11:02 AM  Clinical Narrative:   CSW called patient's niece, Corrie Dandy. She reported patient is a long term resident at [facility] and they plan for him to return there at discharge. CSW provided number for ICU floor for [facility] per her request. CSW called Gavin Pound at [facility], left a voicemail.          Expected Discharge Plan and Services                                                 Social Determinants of Health (SDOH) Interventions    Readmission Risk Interventions No flowsheet data found.

## 2020-01-18 NOTE — Progress Notes (Signed)
Initial Nutrition Assessment  DOCUMENTATION CODES:   Obesity unspecified  INTERVENTION:  Provide Ensure Enlive po BID, each supplement provides 350 kcal and 20 grams of protein.  NUTRITION DIAGNOSIS:   Inadequate oral intake related to lethargy/confusion, decreased appetite as evidenced by meal completion < 50%.  GOAL:   Patient will meet greater than or equal to 90% of their needs  MONITOR:   PO intake, Supplement acceptance, Labs, Weight trends, I & O's  REASON FOR ASSESSMENT:   Consult Malnutrition Eval  ASSESSMENT:   69 year old male with PMHx of HTN, CHF, bipolar disorder, anxiety, GERD, sleep apnea admitted from [facility] with acute hypoxic and hypercapnic respiratory failure from bilateral PNA, septic shock.   Attempted to meet with patient at bedside but he was sleeping and would not awaken to name call or during Nutrition-Focused Physical Exam. He did not eat his breakfast tray this morning. No other meal documentation available at this time, but patient has only been here approximately 15 hours. Will continue to monitor for adequacy of intake.  According to chart patient was 112.2 kg on 03/31/2019. He lost 9 kg (8% body weight) over 3 months, which is significant for time frame. However, some of that weight loss may have been from fluid in setting of CHF.  Medications reviewed and include: Eliquis, Solu-Cortef 50 mg QID, senna-docusate 1 tablet BID, cefepime, LR at 75 mL/hr, magnesium sulfate 2 rams IV once today, Flagyl, norepinephrine gtt now off, phenylephrine gtt at 125 mcg/min.  Labs reviewed: Sodium 131, Potassium 3.4, Chloride 83, CO2 38, BUN 36, Magnesium 1.6.  Unable to determine if patient meets criteria for malnutrition at this time without full nutrition/weight history.  NUTRITION - FOCUSED PHYSICAL EXAM:    Most Recent Value  Orbital Region  No depletion  Upper Arm Region  No depletion  Thoracic and Lumbar Region  No depletion  Buccal Region   No depletion  Temple Region  Mild depletion  Clavicle Bone Region  No depletion  Clavicle and Acromion Bone Region  No depletion  Scapular Bone Region  No depletion  Dorsal Hand  Mild depletion  Patellar Region  No depletion  Anterior Thigh Region  No depletion  Posterior Calf Region  No depletion  Edema (RD Assessment)  None  Hair  Reviewed  Eyes  Unable to assess  Mouth  Unable to assess  Skin  Reviewed  Nails  Reviewed     Diet Order:   Diet Order            Diet Heart Room service appropriate? Yes; Fluid consistency: Thin  Diet effective now             EDUCATION NEEDS:   No education needs have been identified at this time  Skin:  Skin Assessment: Reviewed RN Assessment(ecchymosis)  Last BM:  Unknown  Height:   Ht Readings from Last 1 Encounters:  01/18/20 5\' 11"  (1.803 m)   Weight:   Wt Readings from Last 1 Encounters:  01/18/20 101.3 kg   Ideal Body Weight:  78.2 kg  BMI:  Body mass index is 31.15 kg/m.  Estimated Nutritional Needs:   Kcal:  2100-2300  Protein:  105-115 grams  Fluid:  2 L/day  03/19/20, MS, RD, LDN Pager number available on Amion

## 2020-01-18 NOTE — Progress Notes (Signed)
PT Cancellation Note  Patient Details Name: CARLIN ATTRIDGE MRN: [id number] DOB: 04/11/1951   Cancelled Treatment:    Reason Eval/Treat Not Completed: PT screened, no needs identified, will sign off. PT screened, PT to sign off. Per chart back in 2/21 pt PLOF is WC level with hoyer lift for transfers. PT spoke with CSW, plan is for patient to discharge back to long term care at [facility].    Olga Coaster PT, DPT 11:23 AM,01/18/20

## 2020-01-18 NOTE — Progress Notes (Signed)
Pharmacy Antibiotic Note  Dwayne Bridges is a 70 y.o. male admitted on 01/13/2020 with hypoxic and hypercapnic respiratory failure from bilateral pneumonia. Patient admitted from [facility]. Patient with past medical history significant for Hypertension, Diastolic CHF, Bipolar Disorder, and Anxiety. MRSA PCR is negative. Pharmacy has been consulted for cefepime dosing.  Plan: Continue Cefepime 2g IV Q8hr for 7 days.   Patient initiated on metronidazole 500mg  IV Q8hr during AM ICU rounds.   Height: 5\' 11"  (180.3 cm) Weight: 223 lb 5.2 oz (101.3 kg) IBW/kg (Calculated) : 75.3  Temp (24hrs), Avg:98.1 F (36.7 C), Min:97.7 F (36.5 C), Max:98.6 F (37 C)  Recent Labs  Lab 01/12/20 0641 01/12/2020 2117 01/16/2020 2339 01/18/20 0157 01/18/20 0410  WBC  --  9.9  --   --  10.3  CREATININE 0.76 1.82*  --   --  1.10  LATICACIDVEN  --  3.5* 1.3 1.0 1.9    Estimated Creatinine Clearance: 75.7 mL/min (by C-G formula based on SCr of 1.1 mg/dL).    No Known Allergies  Antimicrobials this admission: Vancomycin 3/7 x 1 Cefepime 3/7 >>  Metronidazole 3/7 >>   Dose adjustments this admission: 3/7 Cefepime transitioned to 2g IV Q8hr.   Microbiology results: 3/7 BCx: no growth < 12 hours  3/7 UCx: pending   3/8 MRSA PCR: negative  3/8 SARS Coronavirus 2: negative  3/8 Influenza A/B: negative   Thank you for allowing pharmacy to be a part of this patient's care.  Aleecia Tapia L 01/18/2020 1:46 PM

## 2020-01-18 NOTE — Progress Notes (Signed)
OT Cancellation Note  Patient Details Name: Dwayne Bridges MRN: [id number] DOB: [birth year]   Cancelled Treatment:    Reason Eval/Treat Not Completed: OT screened, no needs identified, will sign off  OT consult received and chart reviewed. Upon chart review, pt's PLOF noted to include hoyer lift to w/c at baseline. In addition, per PT, upon speaking with CSW, it is noted that plan for pt is to return to [facility] long term care. No acute OT needs identified at this time. Will complete order. Please re-consult should a change occur. Thank you.  Rejeana Brock, MS, OTR/L ascom [phone] 01/18/20, 12:37 PM

## 2020-01-19 ENCOUNTER — Ambulatory Visit: Payer: No Typology Code available for payment source | Admitting: Family

## 2020-01-19 LAB — BASIC METABOLIC PANEL
Anion gap: 12 (ref 5–15)
BUN: 16 mg/dL (ref 8–23)
CO2: 28 mmol/L (ref 22–32)
Calcium: 8.3 mg/dL — ABNORMAL LOW (ref 8.9–10.3)
Chloride: 95 mmol/L — ABNORMAL LOW (ref 98–111)
Creatinine, Ser: 0.49 mg/dL — ABNORMAL LOW (ref 0.61–1.24)
GFR calc Af Amer: >60 mL/min (ref >60)
GFR calc non Af Amer: >60 mL/min (ref >60)
Glucose, Bld: 124 mg/dL — ABNORMAL HIGH (ref 70–99)
Potassium: 3.4 mmol/L — ABNORMAL LOW (ref 3.5–5.1)
Sodium: 135 mmol/L (ref 135–145)

## 2020-01-19 LAB — CBC WITH DIFFERENTIAL/PLATELET
Abs Immature Granulocytes: 0.1 10*3/uL — ABNORMAL HIGH (ref 0.00–0.07)
Basophils Absolute: 0 10*3/uL (ref 0.0–0.1)
Basophils Relative: 0 %
Eosinophils Absolute: 0 10*3/uL (ref 0.0–0.5)
Eosinophils Relative: 0 %
HCT: 34 % — ABNORMAL LOW (ref 39.0–52.0)
Hemoglobin: 11 g/dL — ABNORMAL LOW (ref 13.0–17.0)
Immature Granulocytes: 1 %
Lymphocytes Relative: 7 %
Lymphs Abs: 0.6 10*3/uL — ABNORMAL LOW (ref 0.7–4.0)
MCH: 29.9 pg (ref 26.0–34.0)
MCHC: 32.4 g/dL (ref 30.0–36.0)
MCV: 92.4 fL (ref 80.0–100.0)
Monocytes Absolute: 0.2 10*3/uL (ref 0.1–1.0)
Monocytes Relative: 3 %
Neutro Abs: 8.1 10*3/uL — ABNORMAL HIGH (ref 1.7–7.7)
Neutrophils Relative %: 89 %
Platelets: 173 10*3/uL (ref 150–400)
RBC: 3.68 MIL/uL — ABNORMAL LOW (ref 4.22–5.81)
RDW: 14.9 % (ref 11.5–15.5)
WBC: 9.1 10*3/uL (ref 4.0–10.5)
nRBC: 0 % (ref 0.0–0.2)

## 2020-01-19 LAB — MAGNESIUM: Magnesium: 2.2 mg/dL (ref 1.7–2.4)

## 2020-01-19 LAB — URINE CULTURE: Culture: NO GROWTH

## 2020-01-19 LAB — PHOSPHORUS: Phosphorus: 2.2 mg/dL — ABNORMAL LOW (ref 2.5–4.6)

## 2020-01-19 MED ORDER — POTASSIUM CHLORIDE CRYS ER 20 MEQ PO TBCR: 40.0000 meq | EXTENDED_RELEASE_TABLET | Freq: Once | ORAL | Status: DC

## 2020-01-19 MED ORDER — POTASSIUM CHLORIDE CRYS ER 20 MEQ PO TBCR
20.0000 meq | EXTENDED_RELEASE_TABLET | Freq: Once | ORAL | Status: AC
  Administered 2020-01-19: 20 meq via ORAL
  Filled 2020-01-19: qty 1

## 2020-01-19 MED ORDER — POTASSIUM PHOSPHATES 15 MMOLE/5ML IV SOLN
10.0000 mmol | Freq: Once | INTRAVENOUS | Status: AC
  Administered 2020-01-19: 10 mmol via INTRAVENOUS
  Filled 2020-01-19: qty 3.33

## 2020-01-19 MED ORDER — POTASSIUM CHLORIDE 10 MEQ/100ML IV SOLN: 10.0000 meq | INTRAVENOUS | Status: DC

## 2020-01-19 MED ORDER — MAGNESIUM SULFATE 2 GM/50ML IV SOLN: 2.0000 g | Freq: Once | INTRAVENOUS | Status: DC

## 2020-01-19 NOTE — Progress Notes (Signed)
Transfer from ICU 69 yo male admitted with hypotension likely secondary to hypovolemic shock secondary to dehydration/overdiuresis and possible sepsis with possible bilateral lower lobe pneumonia vs. partial compressive atelectasis  Patient is currently off IV pressors and has minimal oxygen requirement Needs to complete IV antibiotic therapy Optimize diet PT evaluation

## 2020-01-19 NOTE — Consult Note (Signed)
Name: Dwayne Bridges MRN: [id number] DOB: [birth year]    ADMISSION DATE:  02/06/2020 CONSULTATION DATE: 01/18/2020  REFERRING MD : Webb Silversmith, NP    BRIEF PATIENT DESCRIPTION:  69 yo male admitted with hypotension likely secondary to hypovolemic shock secondary to dehydration/overdiuresis and possible sepsis with possible bilateral lower lobe pneumonia vs. partial compressive atelectasis   SIGNIFICANT EVENTS/STUDIES:  03/7: Pt admitted to the stepdown unit with hypotension 03/7: CTA Chest revealed no CT evidence of central pulmonary artery embolus. Moderate cardiomegaly with findings of right heart dysfunction. Correlation with echocardiogram recommended. Small bilateral pleural effusions with bilateral lower lobe partial compressive atelectasis versus pneumonia. Clinical correlation is recommended. Aortic Atherosclerosis  03/8: Pts status changed to ICU due to continue hypotension despite aggressive fluid resuscitation   CC Sleepy today Follow up septic shock  HPI Off vasopressors lethargic but arousable Vs stable DNR/DNI     Review of Systems: LIMITED DUE TO COGNITIVE DYSFUNCTION  Other:  All other systems negative  VITAL SIGNS: Temp:  [97.6 F (36.4 C)-97.9 F (36.6 C)] 97.8 F (36.6 C) (03/09 0400) Pulse Rate:  [32-91] 76 (03/09 0700) Resp:  [14-25] 15 (03/09 0700) BP: (74-158)/(50-120) 116/76 (03/09 0700) SpO2:  [95 %-100 %] 98 % (03/09 0700)  Physical Examination:   General Appearance: No distress  Neuro:without focal findings,   HEENT: PERRLA, EOM intact.   Pulmonary: normal breath sounds, No wheezing.  CardiovascularNormal S1,S2.  No m/r/g.   Abdomen: Benign, Soft, non-tender. Renal:  No costovertebral tenderness  GU:  Not performed at this time. Endoc: No evident thyromegaly Skin:   warm, no rashes, no ecchymosis  Extremities: normal, no cyanosis, clubbing. PSYCHIATRIC: Mood, affect within normal limits.   ALL OTHER ROS ARE  NEGATIVE   Recent Labs  Lab 01/16/2020 2117 01/18/20 0410 01/19/20 0425  NA 128* 131* 135  K 3.2* 3.4* 3.4*  CL 73* 83* 95*  CO2 39* 38* 28  BUN 45* 36* 16  CREATININE 1.82* 1.10 0.49*  GLUCOSE 113* 96 124*   Recent Labs  Lab 02/07/2020 2117 01/18/20 0410 01/19/20 0425  HGB 11.5* 12.0* 11.0*  HCT 34.8* 36.4* 34.0*  WBC 9.9 10.3 9.1  PLT 201 177 173   DG Abdomen 1 View  Result Date: 01/18/2020 CLINICAL DATA:  Abdominal distension EXAM: ABDOMEN - 1 VIEW COMPARISON:  None. FINDINGS: The bowel gas pattern is nonspecific with mild gaseous distention of loops of colon and small bowel scattered throughout the abdomen. The stool burden is average. There are no radiopaque kidney stones. The bladder is moderately distended. There is no evidence for an acute displaced fracture IMPRESSION: Nonspecific bowel gas pattern with mild gaseous distention of loops of colon and small bowel scattered throughout the abdomen. Electronically Signed   By: Katherine Mantle M.D.   On: 01/18/2020 00:31   CT Angio Chest PE W and/or Wo Contrast  Result Date: 01/26/2020 CLINICAL DATA:  70 year old male with concern for pulmonary embolism. EXAM: CT ANGIOGRAPHY CHEST WITH CONTRAST TECHNIQUE: Multidetector CT imaging of the chest was performed using the standard protocol during bolus administration of intravenous contrast. Multiplanar CT image reconstructions and MIPs were obtained to evaluate the vascular anatomy. CONTRAST:  63mL OMNIPAQUE IOHEXOL 350 MG/ML SOLN COMPARISON:  Chest radiograph dated 01/30/2020. FINDINGS: Evaluation of this exam is limited due to respiratory motion artifact. Cardiovascular: There is moderate cardiomegaly. No significant pericardial effusion. There is advanced 3 vessel coronary vascular calcification and calcification of the mitral annulus. There is retrograde flow of contrast from  the right atrium into the IVC indicative of right heart dysfunction. Correlation with echocardiogram  recommended. There is mild atherosclerotic calcification of the thoracic aorta. No aneurysmal dilatation or dissection. Evaluation of the pulmonary arteries is limited due to respiratory motion artifact and suboptimal opacification of the peripheral branches. No large central pulmonary artery embolus identified. Mediastinum/Nodes: Mildly enlarged right hilar lymph nodes measure up to 17 mm. The esophagus is grossly unremarkable. No mediastinal fluid collection. Lungs/Pleura: Small right and trace left pleural effusions. There are bilateral lower lobe partial compressive atelectasis versus pneumonia. Clinical correlation is recommended. There is no pneumothorax. The central airways are patent. Upper Abdomen: Probable sludge within the gallbladder. Colonic diverticulosis. Musculoskeletal: Osteopenia with degenerative changes of the spine. No acute osseous pathology. Review of the MIP images confirms the above findings. IMPRESSION: 1. No CT evidence of central pulmonary artery embolus. 2. Moderate cardiomegaly with findings of right heart dysfunction. Correlation with echocardiogram recommended. 3. Small bilateral pleural effusions with bilateral lower lobe partial compressive atelectasis versus pneumonia. Clinical correlation is recommended. 4. Aortic Atherosclerosis (ICD10-I70.0). Electronically Signed   By: Anner Crete M.D.   On: 01/19/2020 23:34   DG Chest Port 1 View  Result Date: 01/11/2020 CLINICAL DATA:  69 year old male with hypotension and hypoxia. EXAM: PORTABLE CHEST 1 VIEW COMPARISON:  Portable chest 01/07/2020 and earlier. FINDINGS: Portable AP view at 2150 hours. Continued low lung volumes. Stable cardiac size and mediastinal contours. Visualized tracheal air column is within normal limits. Decreased veiling opacity and/or vascular congestion compared to last month. Allowing for portable technique the lungs are clear. No pneumothorax. Negative visible bowel gas pattern. No acute osseous  abnormality identified. IMPRESSION: Low lung volumes but improved ventilation since last month. No acute cardiopulmonary abnormality. Electronically Signed   By: Genevie Ann M.D.   On: 02/10/2020 22:03    ASSESSMENT / PLAN:  SEPTIC SHOCK-RESOLVED  B/L PNEUMONIA -continue IV abx Oxygen as needed   ACUTE KIDNEY INJURY/Renal Failure-resolving -follow chem 7 -follow UO -continue Foley Catheter-assess need   TRANSFER TO TR[facility]    Maretta Bees Patricia Pesa, M.D.  [facility]  Medical Director [place] Director [place] Department

## 2020-01-19 NOTE — TOC Initial Note (Signed)
Transition of Care [facility]) - Initial/Assessment Note    Patient Details  Name: Dwayne Bridges MRN: [id number] Date of Birth: 04/11/1951  [facility] CM/SW Contact:    Liliana Cline, LCSW Phone Number: 01/19/2020, 10:54 AM  Clinical Narrative:                Patient is a long term resident at [facility]. They provide transportation, medications, and patient is followed by their PCP. CSW spoke with patient's niece on 3/8 who confirmed she would like patient to return to [facility] at discharge. CSW has left a Engineer, technical sales for Gavin Pound at [facility] to confirm this. CSW will continue to follow.   Expected Discharge Plan: Skilled Nursing Facility Barriers to Discharge: Continued Medical Work up   Patient Goals and CMS Choice        Expected Discharge Plan and Services Expected Discharge Plan: Skilled Nursing Facility       Living arrangements for the past 2 months: Skilled Nursing Facility                                      Prior Living Arrangements/Services Living arrangements for the past 2 months: Skilled Nursing Facility Lives with:: Facility Resident Patient language and need for interpreter reviewed:: Yes Do you feel safe going back to the place where you live?: Yes      Need for Family Participation in Patient Care: No (Comment) Care giver support system in place?: Yes (comment)   Criminal Activity/Legal Involvement Pertinent to Current Situation/Hospitalization: No - Comment as needed  Activities of Daily Living      Permission Sought/Granted                  Emotional Assessment         Alcohol / Substance Use: Not Applicable Psych Involvement: No (comment)  Admission diagnosis:  Dehydration [E86.0] AKI (acute kidney injury) (HCC) [N17.9] Septic shock (HCC) [A41.9, R65.21] Patient Active Problem List   Diagnosis Date Noted  . AKI (acute kidney injury) (HCC) 01/18/2020  . Dehydration 01/18/2020  . Hypotension  01/18/2020  . Hypokalemia 01/18/2020  . Abdominal distension 01/18/2020  . Hyponatremia 01/18/2020  . Septic shock (HCC) 01/18/2020  . Atrial fibrillation, chronic (HCC) 01/07/2020  . Acute metabolic encephalopathy 01/07/2020  . Bipolar disorder, unspecified (HCC)   . Elevated troponin   . Acute respiratory failure with hypoxia (HCC) 06/30/2019  . Atrial fibrillation with RVR (HCC) 03/28/2019  . Acute on chronic diastolic CHF (congestive heart failure) (HCC) 03/27/2019  . Acute on chronic respiratory failure with hypoxia (HCC) 03/27/2019  . COVID-19 virus infection 03/27/2019  . HTN (hypertension) 03/27/2019  . BPH (benign prostatic hyperplasia) 03/27/2019  . HLD (hyperlipidemia) 03/27/2019  . CHF exacerbation (HCC) 03/27/2019   PCP:  Patient, [name] Per Pharmacy:  No Pharmacies Listed    Social Determinants of Health (SDOH) Interventions    Readmission Risk Interventions Readmission Risk Prevention Plan 01/19/2020  Transportation Screening Complete  PCP or Specialist Appt within 3-5 Days Complete  Medication Review (RN Care Manager) Complete  Some recent data might be hidden

## 2020-01-19 NOTE — Progress Notes (Addendum)
Pharmacy Electrolyte Monitoring Consult:  Pharmacy consulted to assist in monitoring and replacing electrolytes in this 69 y.o. male admitted on 02/10/2020 with hypoxic and hypercapnic respiratory failure from bilateral pneumonia. Patient with past medical history significant for Hypertension, Diastolic CHF, Bipolar Disorder, and Anxiety.  Labs:  Sodium (mmol/L)  Date Value  01/19/2020 135  12/05/2013 142   Potassium (mmol/L)  Date Value  01/19/2020 3.4 (L)  12/05/2013 3.1 (L)   Magnesium (mg/dL)  Date Value  91/79/2178 2.2  12/05/2013 1.8   Phosphorus (mg/dL)  Date Value  37/54/2370 2.2 (L)   Calcium (mg/dL)  Date Value  23/11/7207 8.3 (L)   Calcium, Total (mg/dL)  Date Value  10/68/1661 8.4 (L)   Albumin (g/dL)  Date Value  96/94/0982 2.7 (L)  12/01/2013 2.7 (L)    Assessment/Plan: IV fluids discontinued.   Patient ordered potassium phosphate IV x 1 this am. Will order Potassium PO x 1.   Will replace for goal potassium ~ 4, magnesium ~ 2, and phosphorus ~ 3.   All electrolytes with am labs.   Pharmacy will continue to monitor and adjust per consult.   Coletta Lockner L 01/19/2020 4:37 PM

## 2020-01-19 NOTE — Progress Notes (Signed)
Pharmacy Antibiotic Note  Dwayne Bridges is a 69 y.o. male admitted on 2020-02-12 with hypoxic and hypercapnic respiratory failure from bilateral pneumonia. Patient admitted from [facility]. Patient with past medical history significant for Hypertension, Diastolic CHF, Bipolar Disorder, and Anxiety. MRSA PCR is negative. Pharmacy has been consulted for cefepime dosing.  Plan: Continue Cefepime 2g IV Q8hr for 7 days.   Continue metronidazole 500mg  IV Q8hr for 7 days.   Height: 5\' 11"  (180.3 cm) Weight: 223 lb 5.2 oz (101.3 kg) IBW/kg (Calculated) : 75.3  Temp (24hrs), Avg:98 F (36.7 C), Min:97.6 F (36.4 C), Max:98.8 F (37.1 C)  Recent Labs  Lab 2020-02-12 2117 02/12/2020 2339 01/18/20 0157 01/18/20 0410 01/19/20 0425  WBC 9.9  --   --  10.3 9.1  CREATININE 1.82*  --   --  1.10 0.49*  LATICACIDVEN 3.5* 1.3 1.0 1.9  --     Estimated Creatinine Clearance: 104.1 mL/min (A) (by C-G formula based on SCr of 0.49 mg/dL (L)).    No Known Allergies  Antimicrobials this admission: Vancomycin 3/7 x 1 Cefepime 3/7 >>  Metronidazole 3/7 >>   Dose adjustments this admission: 3/7 Cefepime transitioned to 2g IV Q8hr.   Microbiology results: 3/7 BCx: no growth x 2 days  3/7 UCx: no growth  3/8 MRSA PCR: negative  3/8 SARS Coronavirus 2: negative  3/8 Influenza A/B: negative   Thank you for allowing pharmacy to be a part of this patient's care.  Cybele Maule L 01/19/2020 4:40 PM

## 2020-01-20 ENCOUNTER — Inpatient Hospital Stay
Admit: 2020-01-20 | Discharge: 2020-01-20 | Disposition: A | Discharge status: Home / Self Care | Payer: No Typology Code available for payment source | Attending: Family Medicine | Admitting: Family Medicine

## 2020-01-20 DIAGNOSIS — I5033 Acute on chronic diastolic (congestive) heart failure: Secondary | ICD-10-CM

## 2020-01-20 DIAGNOSIS — E860 Dehydration: Secondary | ICD-10-CM

## 2020-01-20 LAB — BASIC METABOLIC PANEL
Anion gap: 6 (ref 5–15)
BUN: 17 mg/dL (ref 8–23)
CO2: 34 mmol/L — ABNORMAL HIGH (ref 22–32)
Calcium: 8.2 mg/dL — ABNORMAL LOW (ref 8.9–10.3)
Chloride: 95 mmol/L — ABNORMAL LOW (ref 98–111)
Creatinine, Ser: 0.45 mg/dL — ABNORMAL LOW (ref 0.61–1.24)
GFR calc Af Amer: >60 mL/min (ref >60)
GFR calc non Af Amer: >60 mL/min (ref >60)
Glucose, Bld: 90 mg/dL (ref 70–99)
Potassium: 3.9 mmol/L (ref 3.5–5.1)
Sodium: 135 mmol/L (ref 135–145)

## 2020-01-20 LAB — CBC
HCT: 34.4 % — ABNORMAL LOW (ref 39.0–52.0)
Hemoglobin: 10.9 g/dL — ABNORMAL LOW (ref 13.0–17.0)
MCH: 29.5 pg (ref 26.0–34.0)
MCHC: 31.7 g/dL (ref 30.0–36.0)
MCV: 93.2 fL (ref 80.0–100.0)
Platelets: 161 10*3/uL (ref 150–400)
RBC: 3.69 MIL/uL — ABNORMAL LOW (ref 4.22–5.81)
RDW: 14.6 % (ref 11.5–15.5)
WBC: 9.2 10*3/uL (ref 4.0–10.5)
nRBC: 0 % (ref 0.0–0.2)

## 2020-01-20 LAB — PHOSPHORUS: Phosphorus: 1.6 mg/dL — ABNORMAL LOW (ref 2.5–4.6)

## 2020-01-20 LAB — MAGNESIUM: Magnesium: 1.9 mg/dL (ref 1.7–2.4)

## 2020-01-20 MED ORDER — PERFLUTREN LIPID MICROSPHERE
1.0000 mL | INTRAVENOUS | Status: AC | PRN
  Administered 2020-01-20: 2 mL via INTRAVENOUS
  Filled 2020-01-20: qty 10

## 2020-01-20 MED ORDER — SODIUM PHOSPHATES 45 MMOLE/15ML IV SOLN
30.0000 mmol | Freq: Once | INTRAVENOUS | Status: AC
  Administered 2020-01-20: 17:00:00 30 mmol via INTRAVENOUS
  Filled 2020-01-20: qty 10

## 2020-01-20 NOTE — Progress Notes (Signed)
PROGRESS NOTE    Dwayne Bridges  SLH:[id number] DOB: Jul 04, 1951 DOA: 01/16/2020 PCP: Patient, [name] Per   Brief Narrative:  69 yo with hx of HTN, HLD, GERD, anxiety, dHF, bipolar disorder, COVID infectioon in May 2020, atrial fibrillation on eliquis who presented with hypotension and decreased PO intake.  He was recently discharged on 3/2, he'd been treated for HF exacerbation at that admission and was diuresed.  He represented with hypotension and concern for possible sepsis from pneumonia.  He was admitted to the ICU due to persistent hypotension despite aggressive IVF.  He's been transferred back to the hospitalist service on 3/10.  Assessment & Plan:   Active Problems:   HTN (hypertension)   BPH (benign prostatic hyperplasia)   HLD (hyperlipidemia)   Atrial fibrillation, chronic (HCC)   Bipolar disorder, unspecified (HCC)   AKI (acute kidney injury) ([place])   Dehydration   Hypotension   Hypokalemia   Abdominal distension   Hyponatremia   Septic shock (HCC)  Hypotension secondary to Septic Shock vs Overdiuresis  Bilateral Lower Lobe Pneumonia:  Continue cefepime, flagyl Blood cx NGTD, urine cx negative I/O, daily weights.  IS, flutter valve. SLP eval -> recommending mechanical soft BP's improved, will transfer to telemetry  Acute Hypoxic Respiratory Failure 2/2 Pneumonia: Continue abx as noted above  HFpEF: echo from 2/26 with EF 60-65%, indeterminate LV diastolic parameters (see report).  Holding diuresis at this time due to hypotension at presentation. Repeat echo with findings of R heart dysfunction on CT scan Follow I/O, daily weights   HTN: BP meds on hold  HLD: continue lipitor  Atrial Fibrillation: continue eliquis  Bipolar Disorder: continue depakote  ? cognitive deficit: Dwayne Bridges&Ox3, but niece suspects he may have undiagnosed autism.  Delirium precautions.  DVT prophylaxis: eliquis  Code Status: DNR Family Communication: niece Disposition Plan:  . Patient  came from: SNF            . Anticipated d/c place: SNF . Barriers to d/c OR conditions which need to be met to effect Dwayne Bridges safe d/c: pending improvement in oxygenation, blood pressure  Consultants:   Critical Care  Procedures:  none  Antimicrobials: Anti-infectives (From admission, onward)   Start     Dose/Rate Route Frequency Ordered Stop   01/18/20 1400  ceFEPIme (MAXIPIME) 2 g in sodium chloride 0.9 % 100 mL IVPB     2 g 200 mL/hr over 30 Minutes Intravenous Every 8 hours 01/18/20 1012 01/25/20 1359   01/18/20 1015  metroNIDAZOLE (FLAGYL) IVPB 500 mg     500 mg 100 mL/hr over 60 Minutes Intravenous Every 8 hours 01/18/20 1012 01/25/20 1014   01/18/20 0900  ceFEPIme (MAXIPIME) 2 g in sodium chloride 0.9 % 100 mL IVPB  Status:  Discontinued     2 g 200 mL/hr over 30 Minutes Intravenous Every 12 hours 01/18/20 0505 01/18/20 1012   01/13/2020 2130  vancomycin (VANCOCIN) IVPB 1000 mg/200 mL premix  Status:  Discontinued     1,000 mg 200 mL/hr over 60 Minutes Intravenous  Once 01/15/2020 2116 01/16/2020 2128   02/07/2020 2130  ceFEPIme (MAXIPIME) 2 g in sodium chloride 0.9 % 100 mL IVPB     2 g 200 mL/hr over 30 Minutes Intravenous  Once 02/03/2020 2116 01/23/2020 2219   02/05/2020 2130  vancomycin (VANCOREADY) IVPB 2000 mg/400 mL     2,000 mg 200 mL/hr over 120 Minutes Intravenous  Once 01/14/2020 2128 01/18/20 0038     Subjective: Dwayne Bridges&Ox3 No new concerns  Objective: Vitals:   01/20/20 1000 01/20/20 1100 01/20/20 1130 01/20/20 1200  BP: (!) 93/56 101/65 103/65 108/70  Pulse: 67 86 73 95  Resp: _0 Temp:      TempSrc:      SpO2: 98% 97% 97% 96%  Weight:      Height:        Intake/Output Summary (Last 24 hours) at 01/20/2020 1247 Last data filed at 01/20/2020 0911 Gross per 24 hour  Intake 966.19 ml  Output 975 ml  Net -8.81 ml   Filed Weights   02/06/2020 2112 01/18/20 0100  Weight: 99.6 kg 101.3 kg    Examination:  General exam: Appears calm and comfortable    Respiratory system: Clear to auscultation. Respiratory effort normal. Cardiovascular system: S1 & S2 heard, RRR Gastrointestinal system: Abdomen is nondistended, soft and nontender.  Central nervous system: Alert and oriented. No focal neurological deficits. Extremities: moving all extremities Skin: No rashes, lesions or ulcers Psychiatry: Judgement and insight appear normal. Mood & affect appropriate.     Data Reviewed: I have personally reviewed following labs and imaging studies  CBC: Recent Labs  Lab 02/07/2020 2117 01/18/20 0410 01/19/20 0425 01/20/20 0258  WBC 9.9 10.3 9.1 9.2  NEUTROABS 7.7  --  8.1*  --   HGB 11.5* 12.0* 11.0* 10.9*  HCT 34.8* 36.4* 34.0* 34.4*  MCV 91.3 92.6 92.4 93.2  PLT 201 177 173 867   Basic Metabolic Panel: Recent Labs  Lab 02/06/2020 2117 01/18/20 0410 01/19/20 0425 01/20/20 0258  NA 128* 131* 135 135  K 3.2* 3.4* 3.4* 3.9  CL 73* 83* 95* 95*  CO2 39* 38* 28 34*  GLUCOSE 113* 96 124* 90  BUN 45* 36* 16 17  CREATININE 1.82* 1.10 0.49* 0.45*  CALCIUM 8.1* 7.9* 8.3* 8.2*  MG 1.8 1.6* 2.2 1.9  PHOS 4.2 3.2 2.2* 1.6*   GFR: Estimated Creatinine Clearance: 104.1 mL/min (Dwayne Bridges) (by C-G formula based on SCr of 0.45 mg/dL (L)). Liver Function Tests: Recent Labs  Lab 02/07/2020 2117 01/18/20 0410  AST 26 23  ALT 15 15  ALKPHOS 63 61  BILITOT 0.9 0.7  PROT 6.7 6.5  ALBUMIN 2.7* 2.7*   No results for input(s): LIPASE, AMYLASE in the last 168 hours. No results for input(s): AMMONIA in the last 168 hours. Coagulation Profile: Recent Labs  Lab 02/02/2020 2117  INR 1.6*   Cardiac Enzymes: Recent Labs  Lab 01/13/2020 2117  CKTOTAL 104   BNP (last 3 results) No results for input(s): PROBNP in the last 8760 hours. HbA1C: No results for input(s): HGBA1C in the last 72 hours. CBG: Recent Labs  Lab 01/18/20 0052  GLUCAP 80   Lipid Profile: No results for input(s): CHOL, HDL, LDLCALC, TRIG, CHOLHDL, LDLDIRECT in the last 72  hours. Thyroid Function Tests: Recent Labs    01/18/20 0410  TSH 1.281   Anemia Panel: No results for input(s): VITAMINB12, FOLATE, FERRITIN, TIBC, IRON, RETICCTPCT in the last 72 hours. Sepsis Labs: Recent Labs  Lab 01/14/2020 2117 01/30/2020 2339 01/18/20 0157 01/18/20 0410  PROCALCITON 0.16  --   --   --   LATICACIDVEN 3.5* 1.3 1.0 1.9    Recent Results (from the past 240 hour(s))  Respiratory Panel by RT PCR (Flu Saul Dorsi&B, Covid) - Nasopharyngeal Swab     Status: None   Collection Time: 01/12/20  1:07 PM   Specimen: Nasopharyngeal Swab  Result Value Ref Range Status   SARS Coronavirus 2 by RT  PCR NEGATIVE NEGATIVE Final    Comment: (NOTE) SARS-CoV-2 target nucleic acids are NOT DETECTED. The SARS-CoV-2 RNA is generally detectable in upper respiratoy specimens during the acute phase of infection. The lowest concentration of SARS-CoV-2 viral copies this assay can detect is 131 copies/mL. Dwayne Bridges negative result does not preclude SARS-Cov-2 infection and should not be used as the sole basis for treatment or other patient management decisions. Dwayne Bridges negative result may occur with  improper specimen collection/handling, submission of specimen other than nasopharyngeal swab, presence of viral mutation(s) within the areas targeted by this assay, and inadequate number of viral copies (<131 copies/mL). Dwayne Bridges negative result must be combined with clinical observations, patient history, and epidemiological information. The expected result is Negative. Fact Sheet for Patients:  PinkCheek.be Fact Sheet for Healthcare Providers:  GravelBags.it This test is not yet ap proved or cleared by the [place] FDA and  has been authorized for detection and/or diagnosis of SARS-CoV-2 by FDA under an Emergency Use Authorization (EUA). This EUA will remain  in effect (meaning this test can be used) for the duration of the COVID-19 declaration under  Section 564(b)(1) of the Act, 21 U.S.C. section 360bbb-3(b)(1), unless the authorization is terminated or revoked sooner.    Influenza Dwayne Bridges by PCR NEGATIVE NEGATIVE Final   Influenza B by PCR NEGATIVE NEGATIVE Final    Comment: (NOTE) The Xpert Xpress SARS-CoV-2/FLU/RSV assay is intended as an aid in  the diagnosis of influenza from Nasopharyngeal swab specimens and  should not be used as Dwayne Bridges sole basis for treatment. Nasal washings and  aspirates are unacceptable for Xpert Xpress SARS-CoV-2/FLU/RSV  testing. Fact Sheet for Patients: PinkCheek.be Fact Sheet for Healthcare Providers: GravelBags.it This test is not yet approved or cleared by the [place] FDA and  has been authorized for detection and/or diagnosis of SARS-CoV-2 by  FDA under an Emergency Use Authorization (EUA). This EUA will remain  in effect (meaning this test can be used) for the duration of the  Covid-19 declaration under Section 564(b)(1) of the Act, 21  U.S.C. section 360bbb-3(b)(1), unless the authorization is  terminated or revoked. Performed at [facility], [address] [place], [place] [address]   Blood Culture (routine x 2)     Status: None (Preliminary result)   Collection Time: 01/24/2020  9:31 PM   Specimen: BLOOD  Result Value Ref Range Status   Specimen Description BLOOD LEFT ANTECUBITAL  Final   Special Requests   Final    BOTTLES DRAWN AEROBIC AND ANAEROBIC Blood Culture adequate volume   Culture   Final    NO GROWTH 3 DAYS Performed at [facility], [address] [place], [place] [address]    Report Status PENDING  Incomplete  Blood Culture (routine x 2)     Status: None (Preliminary result)   Collection Time: 02/06/2020  9:36 PM   Specimen: BLOOD  Result Value Ref Range Status   Specimen Description BLOOD LEFT HAND  Final   Special Requests   Final    BOTTLES DRAWN AEROBIC AND ANAEROBIC Blood Culture adequate  volume   Culture   Final    NO GROWTH 3 DAYS Performed at [facility], [address] [place], [place] [address]    Report Status PENDING  Incomplete  Urine culture     Status: None   Collection Time: 02/06/2020  9:54 PM   Specimen: In/Out Cath Urine  Result Value Ref Range Status   Specimen Description   Final  IN/OUT CATH URINE Performed at [facility], [address], [place], [place] [address]    Special Requests   Final    NONE Performed at [facility], [address], [place], [place] [address]    Culture   Final    NO GROWTH Performed at [facility], [place] [address] [place], [place] [address]    Report Status 01/19/2020 FINAL  Final  Respiratory Panel by RT PCR (Flu Dwayne Bridges&B, Covid) - Nasopharyngeal Swab     Status: None   Collection Time: 01/11/2020  9:54 PM   Specimen: Nasopharyngeal Swab  Result Value Ref Range Status   SARS Coronavirus 2 by RT PCR NEGATIVE NEGATIVE Final    Comment: (NOTE) SARS-CoV-2 target nucleic acids are NOT DETECTED. The SARS-CoV-2 RNA is generally detectable in upper respiratoy specimens during the acute phase of infection. The lowest concentration of SARS-CoV-2 viral copies this assay can detect is 131 copies/mL. Dwayne Bridges negative result does not preclude SARS-Cov-2 infection and should not be used as the sole basis for treatment or other patient management decisions. Dwayne Bridges negative result may occur with  improper specimen collection/handling, submission of specimen other than nasopharyngeal swab, presence of viral mutation(s) within the areas targeted by this assay, and inadequate number of viral copies (<131 copies/mL). Dwayne Bridges negative result must be combined with clinical observations, patient history, and epidemiological information. The expected result is Negative. Fact Sheet for Patients:  PinkCheek.be Fact Sheet for Healthcare Providers:   GravelBags.it This test is not yet ap proved or cleared by the [place] FDA and  has been authorized for detection and/or diagnosis of SARS-CoV-2 by FDA under an Emergency Use Authorization (EUA). This EUA will remain  in effect (meaning this test can be used) for the duration of the COVID-19 declaration under Section 564(b)(1) of the Act, 21 U.S.C. section 360bbb-3(b)(1), unless the authorization is terminated or revoked sooner.    Influenza Dwayne Bridges by PCR NEGATIVE NEGATIVE Final   Influenza B by PCR NEGATIVE NEGATIVE Final    Comment: (NOTE) The Xpert Xpress SARS-CoV-2/FLU/RSV assay is intended as an aid in  the diagnosis of influenza from Nasopharyngeal swab specimens and  should not be used as Dwayne Bridges sole basis for treatment. Nasal washings and  aspirates are unacceptable for Xpert Xpress SARS-CoV-2/FLU/RSV  testing. Fact Sheet for Patients: PinkCheek.be Fact Sheet for Healthcare Providers: GravelBags.it This test is not yet approved or cleared by the [place] FDA and  has been authorized for detection and/or diagnosis of SARS-CoV-2 by  FDA under an Emergency Use Authorization (EUA). This EUA will remain  in effect (meaning this test can be used) for the duration of the  Covid-19 declaration under Section 564(b)(1) of the Act, 21  U.S.C. section 360bbb-3(b)(1), unless the authorization is  terminated or revoked. Performed at [facility], [address] [place], [place] [address]   MRSA PCR Screening     Status: None   Collection Time: 01/18/20 12:57 AM   Specimen: Nasopharyngeal  Result Value Ref Range Status   MRSA by PCR NEGATIVE NEGATIVE Final    Comment:        The GeneXpert MRSA Assay (FDA approved for NASAL specimens only), is one component of [place] comprehensive MRSA colonization surveillance program. It is not intended to diagnose MRSA infection nor to guide or monitor  treatment for MRSA infections. Performed at [facility], [address], [place], [place] [address]          Radiology Studies:  No results found.      Scheduled Meds: . apixaban  5 mg Oral BID  . atorvastatin  20 mg Oral QHS  . Chlorhexidine Gluconate Cloth  6 each Topical Daily  . divalproex  375 mg Oral BID  . feeding supplement (ENSURE ENLIVE)  237 mL Oral BID BM  . senna-docusate  1 tablet Oral BID   Continuous Infusions: . sodium chloride    . ceFEPime (MAXIPIME) IV 2 g (01/20/20 0600)  . metronidazole 500 mg (01/20/20 0911)     LOS: 2 days    Time spent: over 32 min    Fayrene Helper, MD [facility]   To contact the attending provider between 7A-7P or the covering provider during after hours 7P-7A, please log into the web site [email or web address] and access using universal [place] password for that web site. If you do not have the password, please call the hospital operator.  01/20/2020, 12:47 PM

## 2020-01-20 NOTE — Evaluation (Signed)
Clinical/Bedside Swallow Evaluation Patient Details  Name: Dwayne Bridges MRN: [id number] Date of Birth: [birth year]  Today's Date: 01/20/2020 Time: SLP Start Time (ACUTE ONLY): 0930 SLP Stop Time (ACUTE ONLY): 1025 SLP Time Calculation (min) (ACUTE ONLY): 55 min  Past Medical History:  Past Medical History:  Diagnosis Date  . Anxiety   . Bipolar disorder, unspecified (HCC)   . CHF (congestive heart failure) (HCC)   . Disorientation, unspecified   . GERD (gastroesophageal reflux disease)   . Hypertension   . Hypokalemia   . Sleep apnea, unspecified    Past Surgical History: History reviewed. No pertinent surgical history. HPI:  Pt is a 69 y.o. male with a history of hypertension, atrial fibrillation, GERD, CHF, bipolar disorder, baseline social deficits per family - likely has Autism who was sent to the ED from [facility] due to hypotension.  Initial blood pressure at the facility was 56/33.  Multiple repeat checks by EMS have been in the range of 70/40.  Patient denies any acute complaints.  He is Lasix.  He was recently hospitalized for CHF exacerbation, discharged 5 days ago back to his nursing facility.  Reportedly patient's oral intake has been poor since returning home, and mental status has been declining.  CXR: "Low lung volumes but improved ventilation since last month. No acute abnormality".    Assessment / Plan / Recommendation Clinical Impression  Pt appears to present w/ adequate oropharyngeal phase swallow function w/ no neuromuscular deficits noted. Pt did exhibit consistent Belching which can increase risk for Retrograde bolus activity thus possible aspiration of Reflux material, and Pulmonary decline. Pt's risk for prandial aspiration is reduced when following general aspiration precautions including sitting upright when eating/drinking. No clinical, overt s/s of aspiration noted; no wet vocal quality or decline in respiratory status(O2 sats 99%). During the oral phase,  pt exhibited adequate bolus management and mastication w/ timely A-P transfer; oral clearing achieved w/ all trials. Pt fed self w/ setup support. OM exam was [facility] w/ no unilateral weakness noted. Recommend a more mech soft/regular diet (cooked, moist foods, less dry particulates) d/t GERD, thin liquids. General aspiration and Reflux precautions. Pills in puree IF any difficulty swallowing w/ water, and recommend swallowing pills w/ water NOT sodas d/t the Carbonation/Belching. NSG stated No difficulty swallowing pills w/ water today. Tray setup at meals for support. NSG updated. Precautions posted at bedside.  SLP Visit Diagnosis: Dysphagia, unspecified (R13.10)    Aspiration Risk  (reduced following general precs)    Diet Recommendation  Mech Soft/Regular diet - moist, cut foods(meats); Thin liquids. Less dry, particulates in diet. General aspiration precautions. REFLUX precautions.   Medication Administration: Whole meds with liquid(USE WATER. Whole in puree if needed. )    Other  Recommendations Recommended Consults: Consider GI evaluation;Consider esophageal assessment(Dietician f/u for support) Oral Care Recommendations: Oral care BID;Oral care before and after PO;Staff/trained caregiver to provide oral care Other Recommendations: (n/a)   Follow up Recommendations None      Frequency and Duration (n/a)  (n/a)       Prognosis Prognosis for Safe Diet Advancement: Good      Swallow Study   General Date of Onset: 2020/02/11 HPI: Pt is a 69 y.o. male with a history of hypertension, atrial fibrillation, GERD, CHF, bipolar disorder, baseline social deficits per family - likely has Autism who was sent to the ED from [facility] due to hypotension.  Initial blood pressure at the facility was 56/33.  Multiple repeat checks  by EMS have been in the range of 70/40.  Patient denies any acute complaints.  He is Lasix.  He was recently hospitalized for CHF exacerbation, discharged 5 days ago back  to his nursing facility.  Reportedly patient's oral intake has been poor since returning home, and mental status has been declining.  CXR: "Low lung volumes but improved ventilation since last month. No acute abnormality".  Type of Study: Bedside Swallow Evaluation Previous Swallow Assessment: none Diet Prior to this Study: Regular;Thin liquids Temperature Spikes Noted: No(wbc 9.2) Respiratory Status: Nasal cannula(2L) History of Recent Intubation: No Behavior/Cognition: Alert;Cooperative;Pleasant mood;Distractible;Requires cueing Oral Cavity Assessment: Within Functional Limits Oral Care Completed by SLP: Recent completion by staff Oral Cavity - Dentition: Adequate natural dentition Vision: Functional for self-feeding Self-Feeding Abilities: Able to feed self;Needs assist;Needs set up Patient Positioning: Upright in bed(needed cues/support to position more upright) Baseline Vocal Quality: Normal Volitional Cough: Strong Volitional Swallow: Able to elicit    Oral/Motor/Sensory Function Overall Oral Motor/Sensory Function: Within functional limits   Ice Chips Ice chips: Within functional limits Presentation: Spoon(fed; 2 trials)   Thin Liquid Thin Liquid: Within functional limits Presentation: Cup;Self Fed;Straw(~3-4 ozs)    Nectar Thick Nectar Thick Liquid: Not tested   Honey Thick Honey Thick Liquid: Not tested   Puree Puree: Within functional limits Presentation: Spoon(fed; 6 trials)   Solid     Solid: Within functional limits Presentation: Spoon(fed; 6 trials)       Orinda Kenner, MS, CCC-SLP [place] 01/20/2020,4:48 PM

## 2020-01-20 NOTE — Progress Notes (Signed)
Pharmacy Electrolyte Monitoring Consult:  Pharmacy consulted to assist in monitoring and replacing electrolytes in this 69 y.o. male admitted on 01/12/2020 with hypoxic and hypercapnic respiratory failure from bilateral pneumonia. Patient with past medical history significant for Hypertension, Diastolic CHF, Bipolar Disorder, and Anxiety.  Labs:  Sodium (mmol/L)  Date Value  01/20/2020 135  12/05/2013 142   Potassium (mmol/L)  Date Value  01/20/2020 3.9  12/05/2013 3.1 (L)   Magnesium (mg/dL)  Date Value  46/43/1427 2.2  12/05/2013 1.8   Phosphorus (mg/dL)  Date Value  67/11/1001 2.2 (L)   Calcium (mg/dL)  Date Value  49/61/1643 8.2 (L)   Calcium, Total (mg/dL)  Date Value  53/91/2258 8.4 (L)   Albumin (g/dL)  Date Value  34/62/1947 2.7 (L)  12/01/2013 2.7 (L)    Assessment/Plan: Sodium phosphate 30 mmol IV x 1.   Will replace for goal potassium ~ 4, magnesium ~ 2, and phosphorus ~ 3.   All electrolytes with am labs.   Pharmacy will continue to monitor and adjust per consult.   Milani Lowenstein L 01/20/2020 8:53 AM

## 2020-01-21 LAB — COMPREHENSIVE METABOLIC PANEL
ALT: 13 U/L (ref 0–44)
AST: 16 U/L (ref 15–41)
Albumin: 2.1 g/dL — ABNORMAL LOW (ref 3.5–5.0)
Alkaline Phosphatase: 50 U/L (ref 38–126)
Anion gap: 6 (ref 5–15)
BUN: 13 mg/dL (ref 8–23)
CO2: 36 mmol/L — ABNORMAL HIGH (ref 22–32)
Calcium: 8 mg/dL — ABNORMAL LOW (ref 8.9–10.3)
Chloride: 95 mmol/L — ABNORMAL LOW (ref 98–111)
Creatinine, Ser: 0.49 mg/dL — ABNORMAL LOW (ref 0.61–1.24)
GFR calc Af Amer: >60 mL/min (ref >60)
GFR calc non Af Amer: >60 mL/min (ref >60)
Glucose, Bld: 79 mg/dL (ref 70–99)
Potassium: 3.6 mmol/L (ref 3.5–5.1)
Sodium: 137 mmol/L (ref 135–145)
Total Bilirubin: 0.7 mg/dL (ref 0.3–1.2)
Total Protein: 5.5 g/dL — ABNORMAL LOW (ref 6.5–8.1)

## 2020-01-21 LAB — CBC WITH DIFFERENTIAL/PLATELET
Abs Immature Granulocytes: 0.09 10*3/uL — ABNORMAL HIGH (ref 0.00–0.07)
Basophils Absolute: 0.1 10*3/uL (ref 0.0–0.1)
Basophils Relative: 1 %
Eosinophils Absolute: 0.3 10*3/uL (ref 0.0–0.5)
Eosinophils Relative: 4 %
HCT: 34.6 % — ABNORMAL LOW (ref 39.0–52.0)
Hemoglobin: 11.1 g/dL — ABNORMAL LOW (ref 13.0–17.0)
Immature Granulocytes: 1 %
Lymphocytes Relative: 17 %
Lymphs Abs: 1.3 10*3/uL (ref 0.7–4.0)
MCH: 29.8 pg (ref 26.0–34.0)
MCHC: 32.1 g/dL (ref 30.0–36.0)
MCV: 92.8 fL (ref 80.0–100.0)
Monocytes Absolute: 0.5 10*3/uL (ref 0.1–1.0)
Monocytes Relative: 7 %
Neutro Abs: 5.1 10*3/uL (ref 1.7–7.7)
Neutrophils Relative %: 70 %
Platelets: 182 10*3/uL (ref 150–400)
RBC: 3.73 MIL/uL — ABNORMAL LOW (ref 4.22–5.81)
RDW: 14.9 % (ref 11.5–15.5)
WBC: 7.3 10*3/uL (ref 4.0–10.5)
nRBC: 0 % (ref 0.0–0.2)

## 2020-01-21 LAB — MAGNESIUM: Magnesium: 1.7 mg/dL (ref 1.7–2.4)

## 2020-01-21 LAB — ECHOCARDIOGRAM LIMITED
Height: 71 in
Weight: 3573.22 oz

## 2020-01-21 LAB — PHOSPHORUS: Phosphorus: 2.5 mg/dL (ref 2.5–4.6)

## 2020-01-21 NOTE — Progress Notes (Signed)
PROGRESS NOTE    Dwayne Bridges  VZD:[id number] DOB: 02/22/1951 DOA: 01/18/2020 PCP: Patient, [name] Per   Brief Narrative:  69 yo with hx of HTN, HLD, GERD, anxiety, dHF, bipolar disorder, COVID infectioon in May 2020, atrial fibrillation on eliquis who presented with hypotension and decreased PO intake.  He was recently discharged on 3/2, he'd been treated for HF exacerbation at that admission and was diuresed.  He represented with hypotension and concern for possible sepsis from pneumonia.  He was admitted to the ICU due to persistent hypotension despite aggressive IVF.  He's been transferred back to the hospitalist service on 3/10.  Assessment & Plan:   Active Problems:   HTN (hypertension)   BPH (benign prostatic hyperplasia)   HLD (hyperlipidemia)   Atrial fibrillation, chronic (HCC)   Bipolar disorder, unspecified (HCC)   AKI (acute kidney injury) ([place])   Dehydration   Hypotension   Hypokalemia   Abdominal distension   Hyponatremia   Septic shock (HCC)  Hypotension secondary to Septic Shock vs Overdiuresis  Bilateral Lower Lobe Pneumonia:  Continue cefepime (3/7 - present), flagyl (3/8 - present) Blood cx NGTD, urine cx negative I/O, daily weights.  IS, flutter valve. SLP eval -> recommending mechanical soft BP's improved, will transfer to telemetry  Acute Hypoxic Respiratory Failure 2/2 Pneumonia: Continue abx as noted above Requiring 2 L this AM -> wean as tolerated  HFpEF: echo from 2/26 with EF 60-65%, indeterminate LV diastolic parameters (see report).  Holding diuresis at this time due to hypotension at presentation. Repeat echo with findings of R heart dysfunction on CT scan Follow I/O, daily weights (net positive 8 L and weight up to 111 kg --- he was 99.6 kg on 3/2 day of discharge)  HTN: BP meds on hold  HLD: continue lipitor  Atrial Fibrillation: continue eliquis  Bipolar Disorder: continue depakote  ? cognitive deficit: Dwayne Bridges&Ox3, but niece suspects he  may have undiagnosed autism.  Delirium precautions.  DVT prophylaxis: eliquis  Code Status: DNR Family Communication: niece Disposition Plan:  . Patient came from: SNF            . Anticipated d/c place: SNF . Barriers to d/c OR conditions which need to be met to effect Dwayne Bridges safe d/c: pending improvement in oxygenation, blood pressure  Consultants:   Critical Care  Procedures:  none  Antimicrobials: Anti-infectives (From admission, onward)   Start     Dose/Rate Route Frequency Ordered Stop   01/18/20 1400  ceFEPIme (MAXIPIME) 2 g in sodium chloride 0.9 % 100 mL IVPB     2 g 200 mL/hr over 30 Minutes Intravenous Every 8 hours 01/18/20 1012 01/25/20 1359   01/18/20 1015  metroNIDAZOLE (FLAGYL) IVPB 500 mg     500 mg 100 mL/hr over 60 Minutes Intravenous Every 8 hours 01/18/20 1012 01/25/20 1014   01/18/20 0900  ceFEPIme (MAXIPIME) 2 g in sodium chloride 0.9 % 100 mL IVPB  Status:  Discontinued     2 g 200 mL/hr over 30 Minutes Intravenous Every 12 hours 01/18/20 0505 01/18/20 1012   02/08/2020 2130  vancomycin (VANCOCIN) IVPB 1000 mg/200 mL premix  Status:  Discontinued     1,000 mg 200 mL/hr over 60 Minutes Intravenous  Once 01/24/2020 2116 01/21/2020 2128   01/25/2020 2130  ceFEPIme (MAXIPIME) 2 g in sodium chloride 0.9 % 100 mL IVPB     2 g 200 mL/hr over 30 Minutes Intravenous  Once 02/02/2020 2116 02/02/2020 2219   02/01/2020 2130  vancomycin (VANCOREADY) IVPB 2000 mg/400 mL     2,000 mg 200 mL/hr over 120 Minutes Intravenous  Once 02/06/2020 2128 01/18/20 0038     Subjective: No new concerns Dwayne Bridges&Ox3  Objective: Vitals:   01/21/20 0300 01/21/20 0500 01/21/20 0505 01/21/20 0833  BP: 100/66  115/88 124/74  Pulse: 69  70 68  Resp: (!) 27  15 20   Temp: 98.3 F (36.8 C)  98.2 F (36.8 C) (!) 97.5 F (36.4 C)  TempSrc: Oral  Oral Oral  SpO2: 99%  99% 99%  Weight:  111.3 kg    Height:        Intake/Output Summary (Last 24 hours) at 01/21/2020 1338 Last data filed at 01/21/2020  1314 Gross per 24 hour  Intake 1200 ml  Output 950 ml  Net 250 ml   Filed Weights   01/26/2020 2112 01/18/20 0100 01/21/20 0500  Weight: 99.6 kg 101.3 kg 111.3 kg    Examination:  General: No acute distress. Cardiovascular: Heart sounds show Dwayne Bridges regular rate, and rhythm.  Lungs: Clear to auscultation bilaterally  Abdomen: Soft, nontender, nondistended  Neurological: Alert and oriented 3. Moves all extremities 4. Cranial nerves II through XII grossly intact. Skin: Warm and dry. No rashes or lesions. Extremities: No clubbing or cyanosis. No edema.   Data Reviewed: I have personally reviewed following labs and imaging studies  CBC: Recent Labs  Lab 01/13/2020 2117 01/18/20 0410 01/19/20 0425 01/20/20 0258 01/21/20 0416  WBC 9.9 10.3 9.1 9.2 7.3  NEUTROABS 7.7  --  8.1*  --  5.1  HGB 11.5* 12.0* 11.0* 10.9* 11.1*  HCT 34.8* 36.4* 34.0* 34.4* 34.6*  MCV 91.3 92.6 92.4 93.2 92.8  PLT 201 177 173 161 102   Basic Metabolic Panel: Recent Labs  Lab 01/23/2020 2117 01/18/20 0410 01/19/20 0425 01/20/20 0258 01/21/20 0416  NA 128* 131* 135 135 137  K 3.2* 3.4* 3.4* 3.9 3.6  CL 73* 83* 95* 95* 95*  CO2 39* 38* 28 34* 36*  GLUCOSE 113* 96 124* 90 79  BUN 45* 36* 16 17 13   CREATININE 1.82* 1.10 0.49* 0.45* 0.49*  CALCIUM 8.1* 7.9* 8.3* 8.2* 8.0*  MG 1.8 1.6* 2.2 1.9 1.7  PHOS 4.2 3.2 2.2* 1.6* 2.5   GFR: Estimated Creatinine Clearance: 109 mL/min (Dwayne Bridges) (by C-G formula based on SCr of 0.49 mg/dL (L)). Liver Function Tests: Recent Labs  Lab 01/22/2020 2117 01/18/20 0410 01/21/20 0416  AST 26 23 16   ALT 15 15 13   ALKPHOS 63 61 50  BILITOT 0.9 0.7 0.7  PROT 6.7 6.5 5.5*  ALBUMIN 2.7* 2.7* 2.1*   No results for input(s): LIPASE, AMYLASE in the last 168 hours. No results for input(s): AMMONIA in the last 168 hours. Coagulation Profile: Recent Labs  Lab 01/21/2020 2117  INR 1.6*   Cardiac Enzymes: Recent Labs  Lab 02/01/2020 2117  CKTOTAL 104   BNP (last 3  results) No results for input(s): PROBNP in the last 8760 hours. HbA1C: No results for input(s): HGBA1C in the last 72 hours. CBG: Recent Labs  Lab 01/18/20 0052  GLUCAP 80   Lipid Profile: No results for input(s): CHOL, HDL, LDLCALC, TRIG, CHOLHDL, LDLDIRECT in the last 72 hours. Thyroid Function Tests: No results for input(s): TSH, T4TOTAL, FREET4, T3FREE, THYROIDAB in the last 72 hours. Anemia Panel: No results for input(s): VITAMINB12, FOLATE, FERRITIN, TIBC, IRON, RETICCTPCT in the last 72 hours. Sepsis Labs: Recent Labs  Lab 01/15/2020 2117 01/19/2020 2339 01/18/20 0157 01/18/20 0410  PROCALCITON 0.16  --   --   --   LATICACIDVEN 3.5* 1.3 1.0 1.9    Recent Results (from the past 240 hour(s))  Respiratory Panel by RT PCR (Flu Jordynne Mccown&B, Covid) - Nasopharyngeal Swab     Status: None   Collection Time: 01/12/20  1:07 PM   Specimen: Nasopharyngeal Swab  Result Value Ref Range Status   SARS Coronavirus 2 by RT PCR NEGATIVE NEGATIVE Final    Comment: (NOTE) SARS-CoV-2 target nucleic acids are NOT DETECTED. The SARS-CoV-2 RNA is generally detectable in upper respiratoy specimens during the acute phase of infection. The lowest concentration of SARS-CoV-2 viral copies this assay can detect is 131 copies/mL. Marjo Grosvenor negative result does not preclude SARS-Cov-2 infection and should not be used as the sole basis for treatment or other patient management decisions. Kailyn Dubie negative result may occur with  improper specimen collection/handling, submission of specimen other than nasopharyngeal swab, presence of viral mutation(s) within the areas targeted by this assay, and inadequate number of viral copies (<131 copies/mL). Floree Zuniga negative result must be combined with clinical observations, patient history, and epidemiological information. The expected result is Negative. Fact Sheet for Patients:  PinkCheek.be Fact Sheet for Healthcare Providers:   GravelBags.it This test is not yet ap proved or cleared by the [place] FDA and  has been authorized for detection and/or diagnosis of SARS-CoV-2 by FDA under an Emergency Use Authorization (EUA). This EUA will remain  in effect (meaning this test can be used) for the duration of the COVID-19 declaration under Section 564(b)(1) of the Act, 21 U.S.C. section 360bbb-3(b)(1), unless the authorization is terminated or revoked sooner.    Influenza Elisheva Fallas by PCR NEGATIVE NEGATIVE Final   Influenza B by PCR NEGATIVE NEGATIVE Final    Comment: (NOTE) The Xpert Xpress SARS-CoV-2/FLU/RSV assay is intended as an aid in  the diagnosis of influenza from Nasopharyngeal swab specimens and  should not be used as Kirklin Mcduffee sole basis for treatment. Nasal washings and  aspirates are unacceptable for Xpert Xpress SARS-CoV-2/FLU/RSV  testing. Fact Sheet for Patients: PinkCheek.be Fact Sheet for Healthcare Providers: GravelBags.it This test is not yet approved or cleared by the [place] FDA and  has been authorized for detection and/or diagnosis of SARS-CoV-2 by  FDA under an Emergency Use Authorization (EUA). This EUA will remain  in effect (meaning this test can be used) for the duration of the  Covid-19 declaration under Section 564(b)(1) of the Act, 21  U.S.C. section 360bbb-3(b)(1), unless the authorization is  terminated or revoked. Performed at [facility], [address] [place], [place] [address]   Blood Culture (routine x 2)     Status: None (Preliminary result)   Collection Time: 02/04/2020  9:31 PM   Specimen: BLOOD  Result Value Ref Range Status   Specimen Description BLOOD LEFT ANTECUBITAL  Final   Special Requests   Final    BOTTLES DRAWN AEROBIC AND ANAEROBIC Blood Culture adequate volume   Culture   Final    NO GROWTH 4 DAYS Performed at [facility], [address],  [place],  [address]    Report Status PENDING  Incomplete  Blood Culture (routine x 2)     Status: None (Preliminary result)   Collection Time: 01/20/2020  9:36 PM   Specimen: BLOOD  Result Value Ref Range Status   Specimen Description BLOOD LEFT HAND  Final   Special Requests   Final    BOTTLES DRAWN AEROBIC AND ANAEROBIC Blood Culture adequate  volume   Culture   Final    NO GROWTH 4 DAYS Performed at [facility], [address] [place], [place] [address]    Report Status PENDING  Incomplete  Urine culture     Status: None   Collection Time: 02/08/2020  9:54 PM   Specimen: In/Out Cath Urine  Result Value Ref Range Status   Specimen Description   Final    IN/OUT CATH URINE Performed at [facility], [address], [place], [place] [address]    Special Requests   Final    NONE Performed at [facility], [address], [place], [place] [address]    Culture   Final    NO GROWTH Performed at [facility], [place] [address] [place], [place] [address]    Report Status 01/19/2020 FINAL  Final  Respiratory Panel by RT PCR (Flu Simona Rocque&B, Covid) - Nasopharyngeal Swab     Status: None   Collection Time: 01/19/2020  9:54 PM   Specimen: Nasopharyngeal Swab  Result Value Ref Range Status   SARS Coronavirus 2 by RT PCR NEGATIVE NEGATIVE Final    Comment: (NOTE) SARS-CoV-2 target nucleic acids are NOT DETECTED. The SARS-CoV-2 RNA is generally detectable in upper respiratoy specimens during the acute phase of infection. The lowest concentration of SARS-CoV-2 viral copies this assay can detect is 131 copies/mL. Tatia Petrucci negative result does not preclude SARS-Cov-2 infection and should not be used as the sole basis for treatment or other patient management decisions. Rebekah Zackery negative result may occur with  improper specimen collection/handling, submission of specimen other than nasopharyngeal swab, presence of viral mutation(s) within the areas targeted by this assay, and  inadequate number of viral copies (<131 copies/mL). Kaavya Puskarich negative result must be combined with clinical observations, patient history, and epidemiological information. The expected result is Negative. Fact Sheet for Patients:  PinkCheek.be Fact Sheet for Healthcare Providers:  GravelBags.it This test is not yet ap proved or cleared by the [place] FDA and  has been authorized for detection and/or diagnosis of SARS-CoV-2 by FDA under an Emergency Use Authorization (EUA). This EUA will remain  in effect (meaning this test can be used) for the duration of the COVID-19 declaration under Section 564(b)(1) of the Act, 21 U.S.C. section 360bbb-3(b)(1), unless the authorization is terminated or revoked sooner.    Influenza Keitra Carusone by PCR NEGATIVE NEGATIVE Final   Influenza B by PCR NEGATIVE NEGATIVE Final    Comment: (NOTE) The Xpert Xpress SARS-CoV-2/FLU/RSV assay is intended as an aid in  the diagnosis of influenza from Nasopharyngeal swab specimens and  should not be used as Vasco Chong sole basis for treatment. Nasal washings and  aspirates are unacceptable for Xpert Xpress SARS-CoV-2/FLU/RSV  testing. Fact Sheet for Patients: PinkCheek.be Fact Sheet for Healthcare Providers: GravelBags.it This test is not yet approved or cleared by the [place] FDA and  has been authorized for detection and/or diagnosis of SARS-CoV-2 by  FDA under an Emergency Use Authorization (EUA). This EUA will remain  in effect (meaning this test can be used) for the duration of the  Covid-19 declaration under Section 564(b)(1) of the Act, 21  U.S.C. section 360bbb-3(b)(1), unless the authorization is  terminated or revoked. Performed at [facility], [address] [place], [place] [address]   MRSA PCR Screening     Status: None   Collection Time: 01/18/20 12:57 AM   Specimen: Nasopharyngeal    Result Value Ref Range Status   MRSA by PCR  NEGATIVE NEGATIVE Final    Comment:        The GeneXpert MRSA Assay (FDA approved for NASAL specimens only), is one component of Makaylah Oddo comprehensive MRSA colonization surveillance program. It is not intended to diagnose MRSA infection nor to guide or monitor treatment for MRSA infections. Performed at [facility], [address] [place], [place] [address]          Radiology Studies: No results found.      Scheduled Meds: . apixaban  5 mg Oral BID  . atorvastatin  20 mg Oral QHS  . Chlorhexidine Gluconate Cloth  6 each Topical Daily  . divalproex  375 mg Oral BID  . feeding supplement (ENSURE ENLIVE)  237 mL Oral BID BM  . senna-docusate  1 tablet Oral BID   Continuous Infusions: . sodium chloride    . ceFEPime (MAXIPIME) IV Stopped (01/21/20 1324)  . metronidazole Stopped (01/21/20 1314)     LOS: 3 days    Time spent: over 30 min    Fayrene Helper, MD [facility]   To contact the attending provider between 7A-7P or the covering provider during after hours 7P-7A, please log into the web site [email or web address] and access using universal [place] password for that web site. If you do not have the password, please call the hospital operator.  01/21/2020, 1:38 PM

## 2020-01-21 NOTE — TOC Progression Note (Signed)
Transition of Care [facility]) - Progression Note    Patient Details  Name: Dwayne Bridges MRN: [id number] Date of Birth: 15-Mar-1951  Transition of [facility]) CM/SW Contact  Jaionna Weisse, Lemar Livings, LCSW Phone Number: 01/21/2020, 1:14 PM  Clinical Narrative:   Left message for Tiffany-Adm at [facility] to inform I will be following him until can return to their facility. Have also left message for niece. Pt reports feeling a little better today.     Expected Discharge Plan: Skilled Nursing Facility Barriers to Discharge: Continued Medical Work up  Expected Discharge Plan and Services Expected Discharge Plan: Skilled Nursing Facility       Living arrangements for the past 2 months: Skilled Nursing Facility                                       Social Determinants of Health (SDOH) Interventions    Readmission Risk Interventions Readmission Risk Prevention Plan 01/19/2020  Transportation Screening Complete  PCP or Specialist Appt within 3-5 Days Complete  Medication Review Oceanographer) Complete  Some recent data might be hidden

## 2020-01-21 NOTE — Plan of Care (Signed)

## 2020-01-21 NOTE — Progress Notes (Signed)
Nutrition Follow-up  DOCUMENTATION CODES:   Obesity unspecified  INTERVENTION:  Continue Ensure Enlive po BID, each supplement provides 350 kcal and 20 grams of protein.  Provide Magic cup TID with meals, each supplement provides 290 kcal and 9 grams of protein.  NUTRITION DIAGNOSIS:   Inadequate oral intake related to lethargy/confusion, decreased appetite as evidenced by meal completion < 50%.  Ongoing - addressing with oral nutrition supplements.  GOAL:   Patient will meet greater than or equal to 90% of their needs  Progressing.  MONITOR:   PO intake, Supplement acceptance, Labs, Weight trends, I & O's  REASON FOR ASSESSMENT:   Consult Malnutrition Eval  ASSESSMENT:   69 year old male with PMHx of HTN, CHF, bipolar disorder, anxiety, GERD, sleep apnea admitted from [facility] with acute hypoxic and hypercapnic respiratory failure from bilateral PNA, septic shock.  Met with patient at bedside. He is not a great historian. He reports his appetite is improving. He has been eating 25-50% of his meals per chart. He is drinking Ensure BID. In the past 24 hrs he has had approximately 1409 kcal (67% minimum estimated needs) and 101 grams of protein (96% minimum estimated needs). Patient amenable to also trying Magic Cup to help meet calorie/protein needs. Patient was assessed by SLP on 3/10 who recommended mechanical soft/regular diet (cut meats, moistened food) with thin liquids.  Medications reviewed and include: senna-docusate 1 tablet BID, cefepime, Flagyl.  Labs reviewed: Chloride 95, CO2 36, Creatinine 0.49.  Diet Order:   Diet Order            Diet Heart Room service appropriate? Yes with Assist; Fluid consistency: Thin  Diet effective now             EDUCATION NEEDS:   No education needs have been identified at this time  Skin:  Skin Assessment: Skin Integrity Issues:(wound to sacrum, ecchymosis)  Last BM:  01/20/2020 per chart  Height:   Ht  Readings from Last 1 Encounters:  01/18/20 5' 11"  (1.803 m)   Weight:   Wt Readings from Last 1 Encounters:  01/21/20 111.3 kg   Ideal Body Weight:  78.2 kg  BMI:  Body mass index is 34.22 kg/m.  Estimated Nutritional Needs:   Kcal:  2100-2300  Protein:  105-115 grams  Fluid:  2 L/day  Jacklynn Barnacle, MS, RD, LDN Pager number available on Amion

## 2020-01-21 NOTE — Progress Notes (Signed)
Pharmacy Electrolyte Monitoring Consult:  Pharmacy consulted to assist in monitoring and replacing electrolytes in this 69 y.o. male admitted on 12-Feb-2020 with hypoxic and hypercapnic respiratory failure from bilateral pneumonia. Patient with past medical history significant for Hypertension, Diastolic CHF, Bipolar Disorder, and Anxiety.  Labs:  Sodium (mmol/L)  Date Value  01/21/2020 137  12/05/2013 142   Potassium (mmol/L)  Date Value  01/21/2020 3.6  12/05/2013 3.1 (L)   Magnesium (mg/dL)  Date Value  08/67/6195 1.7  12/05/2013 1.8   Phosphorus (mg/dL)  Date Value  09/32/6712 2.5   Calcium (mg/dL)  Date Value  45/80/9983 8.0 (L)   Calcium, Total (mg/dL)  Date Value  38/25/0539 8.4 (L)   Albumin (g/dL)  Date Value  76/73/4193 2.1 (L)  12/01/2013 2.7 (L)    Assessment/Plan: Labs improved. Diet ordered. Will defer replacement today. Electrolytes with morning labs. If remain stable, will defer management to MD.  Will replace for goal potassium ~ 4, magnesium ~ 2, and phosphorus ~ 3.    Pricilla Riffle, PharmD 01/21/2020 11:38 AM

## 2020-01-21 NOTE — NC FL2 (Signed)
Dwayne LEVEL OF CARE SCREENING TOOL     IDENTIFICATION  Patient Name: Dwayne Bridges Birthdate: Bridges Sex: male Admission Date (Current Location): 01-22-20  [name] and [place] Number:  Dwayne Bridges [id number] [place] and Address:  [facility], [address], [place], [place] [address]      Provider Number: [id number]  Attending Physician Name and Address:  Elodia Florence., *  Relative Name and Phone Number:  Dwayne Bridges [phone]    Current Level of Care: Hospital Recommended Level of Care: [place] Prior Approval Number:    Date Approved/Denied:   PASRR Number: [id number]  Discharge Plan: SNF    Current Diagnoses: Patient Active Problem List   Diagnosis Date Noted  . AKI (acute kidney injury) ([place]) 01/18/2020  . Dehydration 01/18/2020  . Hypotension 01/18/2020  . Hypokalemia 01/18/2020  . Abdominal distension 01/18/2020  . Hyponatremia 01/18/2020  . Septic shock ([place]) 01/18/2020  . Atrial fibrillation, chronic ([place]) 01/07/2020  . Acute metabolic encephalopathy 40/97/3532  . Bipolar disorder, unspecified ([place])   . Elevated troponin   . Acute respiratory failure with hypoxia ([place]) 06/30/2019  . Atrial fibrillation with RVR ([place]) 03/28/2019  . Acute on chronic diastolic CHF (congestive heart failure) ([place]) 03/27/2019  . Acute on chronic respiratory failure with hypoxia ([place]) 03/27/2019  . COVID-19 virus infection 03/27/2019  . HTN (hypertension) 03/27/2019  . BPH (benign prostatic hyperplasia) 03/27/2019  . HLD (hyperlipidemia) 03/27/2019  . CHF exacerbation ([place]) 03/27/2019    Orientation RESPIRATION BLADDER Height & Weight     Self, Situation, Place  O2(2Liters continuous) Incontinent Weight: 245 lb 6 oz (111.3 kg) Height:  5\' 11"  (180.3 cm)  BEHAVIORAL SYMPTOMS/MOOD NEUROLOGICAL BOWEL NUTRITION STATUS      Incontinent Diet(Dys 3 - soft diet thin liquids)  AMBULATORY  STATUS COMMUNICATION OF NEEDS Skin   Total Care Verbally Normal                       Personal Care Assistance Level of Assistance  Bathing, Feeding, Dressing Bathing Assistance: Limited assistance Feeding assistance: Limited assistance Dressing Assistance: Limited assistance     Functional Limitations Info             SPECIAL CARE FACTORS FREQUENCY  PT (By licensed PT)                    Contractures Contractures Info: Not present    Additional Factors Info  Code Status Code Status Info: DNR             Current Medications (01/21/2020):  This is the current hospital active medication list Current Facility-Administered Medications  Medication Dose Route Frequency Provider Last Rate Last Admin  . 0.9 %  sodium chloride infusion  250 mL Intravenous Continuous Flora Lipps, MD      . acetaminophen (TYLENOL) tablet 650 mg  650 mg Oral Q6H PRN Doutova, Anastassia, MD       Or  . acetaminophen (TYLENOL) suppository 650 mg  650 mg Rectal Q6H PRN Doutova, Anastassia, MD      . apixaban (ELIQUIS) tablet 5 mg  5 mg Oral BID Toy Baker, MD   5 mg at 01/21/20 0956  . atorvastatin (LIPITOR) tablet 20 mg  20 mg Oral QHS Toy Baker, MD   20 mg at 01/20/20 2131  . ceFEPIme (MAXIPIME) 2 g in sodium chloride 0.9 % 100 mL IVPB  2 g Intravenous Q8H Flora Lipps, MD 200  mL/hr at 01/21/20 0517 2 g at 01/21/20 0517  . Chlorhexidine Gluconate Cloth 2 % PADS 6 each  6 each Topical Daily Therisa Doyne, MD   6 each at 01/20/20 1200  . divalproex (DEPAKOTE SPRINKLE) capsule 375 mg  375 mg Oral BID Therisa Doyne, MD   375 mg at 01/21/20 0956  . feeding supplement (ENSURE ENLIVE) (ENSURE ENLIVE) liquid 237 mL  237 mL Oral BID BM Erin Fulling, MD   237 mL at 01/21/20 0956  . HYDROcodone-acetaminophen (NORCO/VICODIN) 5-325 MG per tablet 1-2 tablet  1-2 tablet Oral Q4H PRN Doutova, Anastassia, MD      . ipratropium-albuterol (DUONEB) 0.5-2.5 (3) MG/3ML nebulizer  solution 3 mL  3 mL Nebulization Q6H PRN Doutova, Anastassia, MD      . metroNIDAZOLE (FLAGYL) IVPB 500 mg  500 mg Intravenous Q8H Kasa, Kurian, MD 100 mL/hr at 01/21/20 1008 500 mg at 01/21/20 1008  . ondansetron (ZOFRAN) tablet 4 mg  4 mg Oral Q6H PRN Therisa Doyne, MD       Or  . ondansetron (ZOFRAN) injection 4 mg  4 mg Intravenous Q6H PRN Doutova, Anastassia, MD      . senna-docusate (Senokot-S) tablet 1 tablet  1 tablet Oral BID Therisa Doyne, MD   1 tablet at 01/21/20 0459     Discharge Medications: Please see discharge summary for a list of discharge medications.  Relevant Imaging Results:  Relevant Lab Results:   Additional Information SSN: [id number]  Jordyn Hofacker, Lemar Livings, LCSW

## 2020-01-22 LAB — CULTURE, BLOOD (ROUTINE X 2)
Culture: NO GROWTH
Culture: NO GROWTH
Special Requests: ADEQUATE
Special Requests: ADEQUATE

## 2020-01-22 LAB — COMPREHENSIVE METABOLIC PANEL
ALT: 13 U/L (ref 0–44)
AST: 17 U/L (ref 15–41)
Albumin: 2.1 g/dL — ABNORMAL LOW (ref 3.5–5.0)
Alkaline Phosphatase: 49 U/L (ref 38–126)
Anion gap: 6 (ref 5–15)
BUN: 11 mg/dL (ref 8–23)
CO2: 35 mmol/L — ABNORMAL HIGH (ref 22–32)
Calcium: 8.2 mg/dL — ABNORMAL LOW (ref 8.9–10.3)
Chloride: 96 mmol/L — ABNORMAL LOW (ref 98–111)
Creatinine, Ser: 0.42 mg/dL — ABNORMAL LOW (ref 0.61–1.24)
GFR calc Af Amer: >60 mL/min (ref >60)
GFR calc non Af Amer: >60 mL/min (ref >60)
Glucose, Bld: 75 mg/dL (ref 70–99)
Potassium: 3.4 mmol/L — ABNORMAL LOW (ref 3.5–5.1)
Sodium: 137 mmol/L (ref 135–145)
Total Bilirubin: 0.6 mg/dL (ref 0.3–1.2)
Total Protein: 5.4 g/dL — ABNORMAL LOW (ref 6.5–8.1)

## 2020-01-22 LAB — CBC WITH DIFFERENTIAL/PLATELET
Abs Immature Granulocytes: 0.1 10*3/uL — ABNORMAL HIGH (ref 0.00–0.07)
Basophils Absolute: 0.1 10*3/uL (ref 0.0–0.1)
Basophils Relative: 1 %
Eosinophils Absolute: 0.3 10*3/uL (ref 0.0–0.5)
Eosinophils Relative: 4 %
HCT: 34.3 % — ABNORMAL LOW (ref 39.0–52.0)
Hemoglobin: 10.7 g/dL — ABNORMAL LOW (ref 13.0–17.0)
Immature Granulocytes: 1 %
Lymphocytes Relative: 14 %
Lymphs Abs: 1 10*3/uL (ref 0.7–4.0)
MCH: 29.2 pg (ref 26.0–34.0)
MCHC: 31.2 g/dL (ref 30.0–36.0)
MCV: 93.7 fL (ref 80.0–100.0)
Monocytes Absolute: 0.5 10*3/uL (ref 0.1–1.0)
Monocytes Relative: 6 %
Neutro Abs: 5.5 10*3/uL (ref 1.7–7.7)
Neutrophils Relative %: 74 %
Platelets: 174 10*3/uL (ref 150–400)
RBC: 3.66 MIL/uL — ABNORMAL LOW (ref 4.22–5.81)
RDW: 15.4 % (ref 11.5–15.5)
WBC: 7.5 10*3/uL (ref 4.0–10.5)
nRBC: 0 % (ref 0.0–0.2)

## 2020-01-22 LAB — BRAIN NATRIURETIC PEPTIDE: B Natriuretic Peptide: 215 pg/mL — ABNORMAL HIGH (ref 0.0–100.0)

## 2020-01-22 LAB — MAGNESIUM: Magnesium: 1.6 mg/dL — ABNORMAL LOW (ref 1.7–2.4)

## 2020-01-22 LAB — PHOSPHORUS: Phosphorus: 2.6 mg/dL (ref 2.5–4.6)

## 2020-01-22 MED ORDER — MAGNESIUM SULFATE 2 GM/50ML IV SOLN
2.0000 g | Freq: Once | INTRAVENOUS | Status: AC
  Administered 2020-01-22: 2 g via INTRAVENOUS
  Filled 2020-01-22: qty 50

## 2020-01-22 MED ORDER — FUROSEMIDE 40 MG PO TABS: 40.0000 mg | ORAL_TABLET | Freq: Every day | ORAL | Status: DC

## 2020-01-22 MED ORDER — POTASSIUM CHLORIDE CRYS ER 20 MEQ PO TBCR
40.0000 meq | EXTENDED_RELEASE_TABLET | Freq: Once | ORAL | Status: AC
  Administered 2020-01-22: 40 meq via ORAL
  Filled 2020-01-22: qty 2

## 2020-01-22 NOTE — Progress Notes (Signed)
Pharmacy Antibiotic Note  Dwayne Bridges is a 69 y.o. male admitted on 01/23/2020 with hypoxic and hypercapnic respiratory failure from bilateral pneumonia. Patient admitted from [facility]. Patient with past medical history significant for Hypertension, Diastolic CHF, Bipolar Disorder, and Anxiety. MRSA PCR is negative. Pharmacy has been consulted for cefepime dosing.  Plan: Continue Cefepime 2g IV Q8hr for 7 days total.   Continue metronidazole 500mg  IV Q8hr for 7 days total.   Height: 5\' 11"  (180.3 cm) Weight: 244 lb 14.9 oz (111.1 kg) IBW/kg (Calculated) : 75.3  Temp (24hrs), Avg:98.1 F (36.7 C), Min:97.5 F (36.4 C), Max:98.8 F (37.1 C)  Recent Labs  Lab 01/29/2020 2117 01/19/2020 2117 01/16/2020 2339 01/18/20 0157 01/18/20 0410 01/19/20 0425 01/20/20 0258 01/21/20 0416 01/22/20 0538  WBC 9.9   < >  --   --  10.3 9.1 9.2 7.3 7.5  CREATININE 1.82*   < >  --   --  1.10 0.49* 0.45* 0.49* 0.42*  LATICACIDVEN 3.5*  --  1.3 1.0 1.9  --   --   --   --    < > = values in this interval not displayed.    Estimated Creatinine Clearance: 108.9 mL/min (A) (by C-G formula based on SCr of 0.42 mg/dL (L)).    No Known Allergies  Antimicrobials this admission: Vancomycin 3/7 x 1 Cefepime 3/7 >>  Metronidazole 3/7 >>   Dose adjustments this admission: 3/7 Cefepime transitioned to 2g IV Q8hr.   Microbiology results: 3/7 BCx: no growth 3/7 UCx: no growth  3/8 MRSA PCR: negative  3/8 SARS Coronavirus 2: negative  3/8 Influenza A/B: negative   Thank you for allowing pharmacy to be a part of this patient's care.  5/8, PharmD 01/22/2020 8:12 AM

## 2020-01-22 NOTE — Progress Notes (Addendum)
PROGRESS NOTE    Dwayne Bridges  VVO:[id number] DOB: 19-Dec-1950 DOA: 02/02/2020 PCP: Patient, [name] Per   Brief Narrative:  69 yo with hx of HTN, HLD, GERD, anxiety, dHF, bipolar disorder, COVID infectioon in May 2020, atrial fibrillation on eliquis who presented with hypotension and decreased PO intake.  He was recently discharged on 3/2, he'd been treated for HF exacerbation at that admission and was diuresed.  He represented with hypotension and concern for possible sepsis from pneumonia.  He was admitted to the ICU due to persistent hypotension despite aggressive IVF.  He's been transferred back to the hospitalist service on 3/10.  Assessment & Plan:   Active Problems:   HTN (hypertension)   BPH (benign prostatic hyperplasia)   HLD (hyperlipidemia)   Atrial fibrillation, chronic (HCC)   Bipolar disorder, unspecified (HCC)   AKI (acute kidney injury) ([place])   Dehydration   Hypotension   Hypokalemia   Abdominal distension   Hyponatremia   Septic shock (HCC)  Hypotension secondary to Septic Shock vs Overdiuresis  Bilateral Lower Lobe Pneumonia:  Continue cefepime (3/7 - present), flagyl (3/8 - present) - plan for 7 days Blood cx NGTD, urine cx negative I/O, daily weights.  IS, flutter valve. SLP eval -> recommending mechanical soft BP's improved, will transfer to telemetry  Acute Hypoxic Respiratory Failure 2/2 Pneumonia: Continue abx as noted above Requiring 2 L this AM -> wean as tolerated  HFpEF: echo from 2/26 with EF 60-65%, indeterminate LV diastolic parameters (see report).  Holding diuresis at this time due to hypotension at presentation. Repeat echo with findings of R heart dysfunction on CT scan - repeat echo with mildly elevate pulmonary artery systolic pressure Follow I/O, daily weights (net positive 7 L and weight up to 111 kg, stable today --- he was 99.6 kg on 3/2 day of discharge) Will resume lasix daily (instead of BID) - he does have elevated BNP and LE  edema  Wt Readings from Last 3 Encounters:  01/22/20 111.1 kg  01/12/20 99.6 kg  07/02/19 103.2 kg   HTN: BP meds on hold  HLD: continue lipitor  Atrial Fibrillation: continue eliquis  Bipolar Disorder: continue depakote  ? cognitive deficit: Alfrieda Tarry&Ox3, but niece suspects he may have undiagnosed autism.  Delirium precautions.  DVT prophylaxis: eliquis  Code Status: DNR Family Communication: niece 3/12 Disposition Plan:  . Patient came from: SNF            . Anticipated d/c place: SNF . Barriers to d/c OR conditions which need to be met to effect Beverly Suriano safe d/c: pending improvement in oxygenation, blood pressure  Consultants:   Critical Care  Procedures:  Echo IMPRESSIONS    1. Left ventricular ejection fraction, by estimation, is 60 to 65%. The  left ventricle has normal function. The left ventricle has no regional  wall motion abnormalities.  2. Right ventricular systolic function is normal. The right ventricular  size is normal. There is mildly elevated pulmonary artery systolic  pressure.  3. The mitral valve is normal in structure. Mild mitral valve  regurgitation. No evidence of mitral stenosis.  4. The aortic valve is normal in structure. Aortic valve regurgitation is  not visualized. No aortic stenosis is present.  5. The inferior vena cava is normal in size with greater than 50%  respiratory variability, suggesting right atrial pressure of 3 mmHg.   Antimicrobials: Anti-infectives (From admission, onward)   Start     Dose/Rate Route Frequency Ordered Stop   01/18/20  1400  ceFEPIme (MAXIPIME) 2 g in sodium chloride 0.9 % 100 mL IVPB     2 g 200 mL/hr over 30 Minutes Intravenous Every 8 hours 01/18/20 1012 01/25/20 1359   01/18/20 1015  metroNIDAZOLE (FLAGYL) IVPB 500 mg     500 mg 100 mL/hr over 60 Minutes Intravenous Every 8 hours 01/18/20 1012 01/25/20 1014   01/18/20 0900  ceFEPIme (MAXIPIME) 2 g in sodium chloride 0.9 % 100 mL IVPB  Status:   Discontinued     2 g 200 mL/hr over 30 Minutes Intravenous Every 12 hours 01/18/20 0505 01/18/20 1012   01/16/2020 2130  vancomycin (VANCOCIN) IVPB 1000 mg/200 mL premix  Status:  Discontinued     1,000 mg 200 mL/hr over 60 Minutes Intravenous  Once 01/16/2020 2116 01/29/2020 2128   01/26/2020 2130  ceFEPIme (MAXIPIME) 2 g in sodium chloride 0.9 % 100 mL IVPB     2 g 200 mL/hr over 30 Minutes Intravenous  Once 01/13/2020 2116 02/09/2020 2219   01/30/2020 2130  vancomycin (VANCOREADY) IVPB 2000 mg/400 mL     2,000 mg 200 mL/hr over 120 Minutes Intravenous  Once 02/06/2020 2128 01/18/20 0038     Subjective: No new concerns  Objective: Vitals:   01/22/20 0500 01/22/20 0801 01/22/20 0802 01/22/20 1535  BP:  130/81 130/81 103/61  Pulse:  85 85 70  Resp:  (!) 21 (!) 21 19  Temp:  98.1 F (36.7 C) 98.1 F (36.7 C) 98.2 F (36.8 C)  TempSrc:  Oral Oral Axillary  SpO2:   98% 98%  Weight: 111.1 kg     Height:        Intake/Output Summary (Last 24 hours) at 01/22/2020 1900 Last data filed at 01/22/2020 1712 Gross per 24 hour  Intake 230 ml  Output 1600 ml  Net -1370 ml   Filed Weights   01/18/20 0100 01/21/20 0500 01/22/20 0500  Weight: 101.3 kg 111.3 kg 111.1 kg    Examination:  General: No acute distress. Cardiovascular: Heart sounds show Jaklyn Alen regular rate, and rhythm.  Lungs: Clear to auscultation bilaterally  Abdomen: Soft, nontender, nondistended Neurological: Alert and oriented 3. Moves all extremities 4 . Cranial nerves II through XII grossly intact. Skin: Warm and dry. No rashes or lesions. Extremities: No clubbing or cyanosis. Trace edema   Data Reviewed: I have personally reviewed following labs and imaging studies  CBC: Recent Labs  Lab 01/25/2020 2117 01/20/2020 2117 01/18/20 0410 01/19/20 0425 01/20/20 0258 01/21/20 0416 01/22/20 0538  WBC 9.9   < > 10.3 9.1 9.2 7.3 7.5  NEUTROABS 7.7  --   --  8.1*  --  5.1 5.5  HGB 11.5*   < > 12.0* 11.0* 10.9* 11.1* 10.7*  HCT  34.8*   < > 36.4* 34.0* 34.4* 34.6* 34.3*  MCV 91.3   < > 92.6 92.4 93.2 92.8 93.7  PLT 201   < > 177 173 161 182 174   < > = values in this interval not displayed.   Basic Metabolic Panel: Recent Labs  Lab 01/18/20 0410 01/19/20 0425 01/20/20 0258 01/21/20 0416 01/22/20 0538  NA 131* 135 135 137 137  K 3.4* 3.4* 3.9 3.6 3.4*  CL 83* 95* 95* 95* 96*  CO2 38* 28 34* 36* 35*  GLUCOSE 96 124* 90 79 75  BUN 36* 16 17 13 11   CREATININE 1.10 0.49* 0.45* 0.49* 0.42*  CALCIUM 7.9* 8.3* 8.2* 8.0* 8.2*  MG 1.6* 2.2 1.9 1.7 1.6*  PHOS  3.2 2.2* 1.6* 2.5 2.6   GFR: Estimated Creatinine Clearance: 108.9 mL/min (Piero Mustard) (by C-G formula based on SCr of 0.42 mg/dL (L)). Liver Function Tests: Recent Labs  Lab 01/23/2020 2117 01/18/20 0410 01/21/20 0416 01/22/20 0538  AST 26 23 16 17   ALT 15 15 13 13   ALKPHOS 63 61 50 49  BILITOT 0.9 0.7 0.7 0.6  PROT 6.7 6.5 5.5* 5.4*  ALBUMIN 2.7* 2.7* 2.1* 2.1*   No results for input(s): LIPASE, AMYLASE in the last 168 hours. No results for input(s): AMMONIA in the last 168 hours. Coagulation Profile: Recent Labs  Lab 01/27/2020 2117  INR 1.6*   Cardiac Enzymes: Recent Labs  Lab 01/18/2020 2117  CKTOTAL 104   BNP (last 3 results) No results for input(s): PROBNP in the last 8760 hours. HbA1C: No results for input(s): HGBA1C in the last 72 hours. CBG: Recent Labs  Lab 01/18/20 0052  GLUCAP 80   Lipid Profile: No results for input(s): CHOL, HDL, LDLCALC, TRIG, CHOLHDL, LDLDIRECT in the last 72 hours. Thyroid Function Tests: No results for input(s): TSH, T4TOTAL, FREET4, T3FREE, THYROIDAB in the last 72 hours. Anemia Panel: No results for input(s): VITAMINB12, FOLATE, FERRITIN, TIBC, IRON, RETICCTPCT in the last 72 hours. Sepsis Labs: Recent Labs  Lab 01/19/2020 2117 01/13/2020 2339 01/18/20 0157 01/18/20 0410  PROCALCITON 0.16  --   --   --   LATICACIDVEN 3.5* 1.3 1.0 1.9    Recent Results (from the past 240 hour(s))  Blood Culture  (routine x 2)     Status: None   Collection Time: 01/25/2020  9:31 PM   Specimen: BLOOD  Result Value Ref Range Status   Specimen Description BLOOD LEFT ANTECUBITAL  Final   Special Requests   Final    BOTTLES DRAWN AEROBIC AND ANAEROBIC Blood Culture adequate volume   Culture   Final    NO GROWTH 5 DAYS Performed at [facility], [address] [place], [place] [address]    Report Status 01/22/2020 FINAL  Final  Blood Culture (routine x 2)     Status: None   Collection Time: 02/07/2020  9:36 PM   Specimen: BLOOD  Result Value Ref Range Status   Specimen Description BLOOD LEFT HAND  Final   Special Requests   Final    BOTTLES DRAWN AEROBIC AND ANAEROBIC Blood Culture adequate volume   Culture   Final    NO GROWTH 5 DAYS Performed at [facility], [address] [place], [place] [address]    Report Status 01/22/2020 FINAL  Final  Urine culture     Status: None   Collection Time: 01/16/2020  9:54 PM   Specimen: In/Out Cath Urine  Result Value Ref Range Status   Specimen Description   Final    IN/OUT CATH URINE Performed at [facility], [address], [place], [place] [address]    Special Requests   Final    NONE Performed at [facility], [address] [place], [place] [address]    Culture   Final    NO GROWTH Performed at [facility], [place] [address], [place], [place] [address]    Report Status 01/19/2020 FINAL  Final  Respiratory Panel by RT PCR (Flu Chery Giusto&B, Covid) - Nasopharyngeal Swab     Status: None   Collection Time: 01/12/2020  9:54 PM   Specimen: Nasopharyngeal Swab  Result Value Ref Range Status   SARS Coronavirus 2 by RT PCR NEGATIVE NEGATIVE Final  Comment: (NOTE) SARS-CoV-2 target nucleic acids are NOT DETECTED. The SARS-CoV-2 RNA is generally detectable in upper respiratoy specimens during the acute phase of infection. The lowest concentration of SARS-CoV-2 viral copies this assay can detect is 131 copies/mL. Jream Broyles  negative result does not preclude SARS-Cov-2 infection and should not be used as the sole basis for treatment or other patient management decisions. Marcellis Frampton negative result may occur with  improper specimen collection/handling, submission of specimen other than nasopharyngeal swab, presence of viral mutation(s) within the areas targeted by this assay, and inadequate number of viral copies (<131 copies/mL). Lachlyn Vanderstelt negative result must be combined with clinical observations, patient history, and epidemiological information. The expected result is Negative. Fact Sheet for Patients:  PinkCheek.be Fact Sheet for Healthcare Providers:  GravelBags.it This test is not yet ap proved or cleared by the [place] FDA and  has been authorized for detection and/or diagnosis of SARS-CoV-2 by FDA under an Emergency Use Authorization (EUA). This EUA will remain  in effect (meaning this test can be used) for the duration of the COVID-19 declaration under Section 564(b)(1) of the Act, 21 U.S.C. section 360bbb-3(b)(1), unless the authorization is terminated or revoked sooner.    Influenza Sabri Teal by PCR NEGATIVE NEGATIVE Final   Influenza B by PCR NEGATIVE NEGATIVE Final    Comment: (NOTE) The Xpert Xpress SARS-CoV-2/FLU/RSV assay is intended as an aid in  the diagnosis of influenza from Nasopharyngeal swab specimens and  should not be used as Aseel Uhde sole basis for treatment. Nasal washings and  aspirates are unacceptable for Xpert Xpress SARS-CoV-2/FLU/RSV  testing. Fact Sheet for Patients: PinkCheek.be Fact Sheet for Healthcare Providers: GravelBags.it This test is not yet approved or cleared by the [place] FDA and  has been authorized for detection and/or diagnosis of SARS-CoV-2 by  FDA under an Emergency Use Authorization (EUA). This EUA will remain  in effect (meaning this test can be used) for  the duration of the  Covid-19 declaration under Section 564(b)(1) of the Act, 21  U.S.C. section 360bbb-3(b)(1), unless the authorization is  terminated or revoked. Performed at [facility], [address] [place], [place] [address]   MRSA PCR Screening     Status: None   Collection Time: 01/18/20 12:57 AM   Specimen: Nasopharyngeal  Result Value Ref Range Status   MRSA by PCR NEGATIVE NEGATIVE Final    Comment:        The GeneXpert MRSA Assay (FDA approved for NASAL specimens only), is one component of Mclain Freer comprehensive MRSA colonization surveillance program. It is not intended to diagnose MRSA infection nor to guide or monitor treatment for MRSA infections. Performed at [facility], [address] [place], [place] [address]          Radiology Studies: ECHOCARDIOGRAM LIMITED  Result Date: 01/21/2020    ECHOCARDIOGRAM LIMITED REPORT   Patient Name:   CRESCENCIO JOZWIAK Date of Exam: 01/20/2020 Medical Rec #:  [id number]      Height:       71.0 in Accession #:    [id number]     Weight:       223.3 lb Date of Birth:  1951-05-20      BSA:          2.210 m Patient Age:    41 years       BP:           109/69 mmHg Patient Gender: M  HR:           68 bpm. Exam Location:  [facility] Procedure: 2D Echo, Limited Echo, Intracardiac Opacification Agent, Limited            Color Doppler and Cardiac Doppler Indications:     R06.00 Dyspnea  History:         Patient has prior history of Echocardiogram examinations, most                  recent 01/08/2020. Risk Factors:Hypertension. Sleep apnea.                  Congestive heart failure.  Sonographer:     Wilford Sports Rodgers-Jones Referring Phys:  [place] Diagnosing Phys: Isaias Cowman MD IMPRESSIONS  1. Left ventricular ejection fraction, by estimation, is 60 to 65%. The left ventricle has normal function. The left ventricle has no regional wall motion abnormalities.  2. Right ventricular systolic function  is normal. The right ventricular size is normal. There is mildly elevated pulmonary artery systolic pressure.  3. The mitral valve is normal in structure. Mild mitral valve regurgitation. No evidence of mitral stenosis.  4. The aortic valve is normal in structure. Aortic valve regurgitation is not visualized. No aortic stenosis is present.  5. The inferior vena cava is normal in size with greater than 50% respiratory variability, suggesting right atrial pressure of 3 mmHg. FINDINGS  Left Ventricle: Left ventricular ejection fraction, by estimation, is 60 to 65%. The left ventricle has normal function. The left ventricle has no regional wall motion abnormalities. Definity contrast agent was given IV to delineate the left ventricular  endocardial borders. The left ventricular internal cavity size was normal in size. There is no left ventricular hypertrophy. Right Ventricle: The right ventricular size is normal. No increase in right ventricular wall thickness. Right ventricular systolic function is normal. There is mildly elevated pulmonary artery systolic pressure. The tricuspid regurgitant velocity is 2.28  m/s, and with an assumed right atrial pressure of 10 mmHg, the estimated right ventricular systolic pressure is 36.1 mmHg. Left Atrium: Left atrial size was normal in size. Right Atrium: Right atrial size was normal in size. Pericardium: There is no evidence of pericardial effusion. Mitral Valve: The mitral valve is normal in structure. Normal mobility of the mitral valve leaflets. Moderate mitral annular calcification. Mild mitral valve regurgitation. No evidence of mitral valve stenosis. Tricuspid Valve: The tricuspid valve is normal in structure. Tricuspid valve regurgitation is mild . No evidence of tricuspid stenosis. Aortic Valve: The aortic valve is normal in structure. Aortic valve regurgitation is not visualized. No aortic stenosis is present. Aortic valve mean gradient measures 31.0 mmHg. Aortic valve  peak gradient measures 43.6 mmHg. Pulmonic Valve: The pulmonic valve was normal in structure. Pulmonic valve regurgitation is not visualized. No evidence of pulmonic stenosis. Aorta: The aortic root is normal in size and structure. Venous: The inferior vena cava is normal in size with greater than 50% respiratory variability, suggesting right atrial pressure of 3 mmHg. IAS/Shunts: No atrial level shunt detected by color flow Doppler.  LEFT VENTRICLE PLAX 2D LVIDd:         4.33 cm LVIDs:         2.30 cm LV PW:         1.18 cm LV IVS:        1.18 cm  RIGHT VENTRICLE RV Basal diam:  3.89 cm RV S prime:     13.45 cm/s TAPSE (  M-mode): 1.7 cm LEFT ATRIUM         Index LA diam:    5.20 cm 2.35 cm/m  AORTIC VALVE AV Vmax:      330.00 cm/s AV Vmean:     264.000 cm/s AV VTI:       0.631 m AV Peak Grad: 43.6 mmHg AV Mean Grad: 31.0 mmHg  AORTA Ao Root diam: 3.30 cm TRICUSPID VALVE TR Peak grad:   20.8 mmHg TR Vmax:        228.00 cm/s Isaias Cowman MD Electronically signed by Isaias Cowman MD Signature Date/Time: 01/21/2020/4:11:56 PM    Final         Scheduled Meds: . apixaban  5 mg Oral BID  . atorvastatin  20 mg Oral QHS  . Chlorhexidine Gluconate Cloth  6 each Topical Daily  . divalproex  375 mg Oral BID  . feeding supplement (ENSURE ENLIVE)  237 mL Oral BID BM  . senna-docusate  1 tablet Oral BID   Continuous Infusions: . sodium chloride    . ceFEPime (MAXIPIME) IV 2 g (01/22/20 1438)  . metronidazole 500 mg (01/22/20 1833)     LOS: 4 days    Time spent: over 30 min    Fayrene Helper, MD [facility]   To contact the attending provider between 7A-7P or the covering provider during after hours 7P-7A, please log into the web site [email or web address] and access using universal [place] password for that web site. If you do not have the password, please call the hospital operator.  01/22/2020, 7:00 PM

## 2020-01-23 ENCOUNTER — Inpatient Hospital Stay: Payer: No Typology Code available for payment source

## 2020-01-23 DIAGNOSIS — R0902 Hypoxemia: Secondary | ICD-10-CM

## 2020-01-23 LAB — BRAIN NATRIURETIC PEPTIDE: B Natriuretic Peptide: 236 pg/mL — ABNORMAL HIGH (ref 0.0–100.0)

## 2020-01-23 LAB — COMPREHENSIVE METABOLIC PANEL
ALT: 10 U/L (ref 0–44)
AST: 14 U/L — ABNORMAL LOW (ref 15–41)
Albumin: 2.2 g/dL — ABNORMAL LOW (ref 3.5–5.0)
Alkaline Phosphatase: 49 U/L (ref 38–126)
Anion gap: 6 (ref 5–15)
BUN: 11 mg/dL (ref 8–23)
CO2: 39 mmol/L — ABNORMAL HIGH (ref 22–32)
Calcium: 8.4 mg/dL — ABNORMAL LOW (ref 8.9–10.3)
Chloride: 95 mmol/L — ABNORMAL LOW (ref 98–111)
Creatinine, Ser: 0.37 mg/dL — ABNORMAL LOW (ref 0.61–1.24)
GFR calc Af Amer: >60 mL/min (ref >60)
GFR calc non Af Amer: >60 mL/min (ref >60)
Glucose, Bld: 77 mg/dL (ref 70–99)
Potassium: 4.4 mmol/L (ref 3.5–5.1)
Sodium: 140 mmol/L (ref 135–145)
Total Bilirubin: 0.6 mg/dL (ref 0.3–1.2)
Total Protein: 5.5 g/dL — ABNORMAL LOW (ref 6.5–8.1)

## 2020-01-23 LAB — CBC WITH DIFFERENTIAL/PLATELET
Abs Immature Granulocytes: 0.1 10*3/uL — ABNORMAL HIGH (ref 0.00–0.07)
Basophils Absolute: 0.1 10*3/uL (ref 0.0–0.1)
Basophils Relative: 1 %
Eosinophils Absolute: 0.3 10*3/uL (ref 0.0–0.5)
Eosinophils Relative: 4 %
HCT: 33.8 % — ABNORMAL LOW (ref 39.0–52.0)
Hemoglobin: 10.5 g/dL — ABNORMAL LOW (ref 13.0–17.0)
Immature Granulocytes: 1 %
Lymphocytes Relative: 12 %
Lymphs Abs: 1 10*3/uL (ref 0.7–4.0)
MCH: 29.9 pg (ref 26.0–34.0)
MCHC: 31.1 g/dL (ref 30.0–36.0)
MCV: 96.3 fL (ref 80.0–100.0)
Monocytes Absolute: 0.5 10*3/uL (ref 0.1–1.0)
Monocytes Relative: 6 %
Neutro Abs: 6.5 10*3/uL (ref 1.7–7.7)
Neutrophils Relative %: 76 %
Platelets: 181 10*3/uL (ref 150–400)
RBC: 3.51 MIL/uL — ABNORMAL LOW (ref 4.22–5.81)
RDW: 15.4 % (ref 11.5–15.5)
WBC: 8.4 10*3/uL (ref 4.0–10.5)
nRBC: 0 % (ref 0.0–0.2)

## 2020-01-23 LAB — MAGNESIUM: Magnesium: 1.9 mg/dL (ref 1.7–2.4)

## 2020-01-23 LAB — BLOOD GAS, VENOUS
Acid-Base Excess: 17.1 mmol/L — ABNORMAL HIGH (ref 0.0–2.0)
Bicarbonate: 45.6 mmol/L — ABNORMAL HIGH (ref 20.0–28.0)
O2 Saturation: 99.6 %
Patient temperature: 37
pCO2, Ven: 72 mmHg (ref 44.0–60.0)
pH, Ven: 7.41 (ref 7.250–7.430)
pO2, Ven: 182 mmHg — ABNORMAL HIGH (ref 32.0–45.0)

## 2020-01-23 LAB — PHOSPHORUS: Phosphorus: 2.6 mg/dL (ref 2.5–4.6)

## 2020-01-23 MED ORDER — DILTIAZEM HCL 30 MG PO TABS
60.0000 mg | ORAL_TABLET | Freq: Four times a day (QID) | ORAL | Status: DC
  Administered 2020-01-23 – 2020-01-30 (×22): 60 mg via ORAL
  Filled 2020-01-23 (×23): qty 2

## 2020-01-23 MED ORDER — METOPROLOL TARTRATE 5 MG/5ML IV SOLN
2.5000 mg | Freq: Four times a day (QID) | INTRAVENOUS | Status: DC | PRN
  Administered 2020-01-29: 2.5 mg via INTRAVENOUS
  Filled 2020-01-23 (×2): qty 5

## 2020-01-23 MED ORDER — FUROSEMIDE 10 MG/ML IJ SOLN: 40.0000 mg | Freq: Two times a day (BID) | INTRAMUSCULAR | Status: DC

## 2020-01-23 MED ORDER — FUROSEMIDE 10 MG/ML IJ SOLN
40.0000 mg | Freq: Once | INTRAMUSCULAR | Status: AC
  Administered 2020-01-23: 40 mg via INTRAVENOUS
  Filled 2020-01-23: qty 4

## 2020-01-23 NOTE — Plan of Care (Signed)
  Problem: Education: Goal: Knowledge of General Education information will improve Description Including pain rating scale, medication(s)/side effects and non-pharmacologic comfort measures Outcome: Progressing   Problem: Health Behavior/Discharge Planning: Goal: Ability to manage health-related needs will improve Outcome: Progressing   Problem: Clinical Measurements: Goal: Ability to maintain clinical measurements within normal limits will improve Outcome: Progressing Goal: Will remain free from infection Outcome: Progressing Goal: Diagnostic test results will improve Outcome: Progressing Goal: Respiratory complications will improve Outcome: Progressing   Problem: Coping: Goal: Level of anxiety will decrease Outcome: Progressing   Problem: Pain Managment: Goal: General experience of comfort will improve Outcome: Progressing   Problem: Safety: Goal: Ability to remain free from injury will improve Outcome: Progressing   

## 2020-01-23 NOTE — Progress Notes (Signed)
Pt's O2 sats 67% on RA.  HR up to 120. No increased confusion, chest pain nor other distress noted. 2L O2 appliede. O2 sats up to mid 90's within few min.  VSS. Dr. Lowell Guitar made aware.  No new orders.

## 2020-01-23 NOTE — Progress Notes (Signed)
HR fluctuates 120's-150's A.fib.  O2 sats 87% on 2L O2 per [place]. Rest of VSS. Pt denies chest pain or any distress. Dr. Lowell Guitar made aware. MD placed orders.

## 2020-01-23 NOTE — Progress Notes (Addendum)
PROGRESS NOTE    Dwayne Bridges  SFK:[id number] DOB: 1951-06-05 DOA: 01/22/2020 PCP: Patient, [name] Per   Brief Narrative:  69 yo with hx of HTN, HLD, GERD, anxiety, dHF, bipolar disorder, COVID infectioon in May 2020, atrial fibrillation on eliquis who presented with hypotension and decreased PO intake.  He was recently discharged on 3/2, he'd been treated for HF exacerbation at that admission and was diuresed.  He represented with hypotension and concern for possible sepsis from pneumonia.  He was admitted to the ICU due to persistent hypotension despite aggressive IVF.  He's been transferred back to the hospitalist service on 3/10.  Assessment & Plan:   Active Problems:   HTN (hypertension)   BPH (benign prostatic hyperplasia)   HLD (hyperlipidemia)   Atrial fibrillation, chronic (HCC)   Bipolar disorder, unspecified (HCC)   AKI (acute kidney injury) ([name])   Dehydration   Hypotension   Hypokalemia   Abdominal distension   Hyponatremia   Septic shock (HCC)  Hypotension secondary to Septic Shock vs Overdiuresis  Bilateral Lower Lobe Pneumonia:  Continue cefepime (3/7 - present), flagyl (3/8 - present) - plan for 7 days Blood cx NGTD, urine cx negative I/O, daily weights.  IS, flutter valve. SLP eval -> recommending mechanical soft BP's improved, will transfer to telemetry  Acute Hypoxic Respiratory Failure 2/2 Pneumonia: Continue abx as noted above Requiring 2 L this AM -> wean as tolerated Repeat CXR with hazy opacity of R chest, R effusion, vascular congestion and cardiomegaly - suspect 2/2 overload Repeat cxr in AM  HFpEF: echo from 2/26 with EF 60-65%, indeterminate LV diastolic parameters (see report).  Holding diuresis at this time due to hypotension at presentation. Repeat echo with findings of R heart dysfunction on CT scan - repeat echo with mildly elevate pulmonary artery systolic pressure Follow I/O, daily weights (net positive 6 L and weight up to 112.6 kg,  stable today --- he was 99.6 kg on 3/2 day of discharge) Will resume lasix 40 IV BID - scrotal edema, LE edema, elevated BNP, will continue to monitor  Wt Readings from Last 3 Encounters:  01/23/20 112.6 kg  01/12/20 99.6 kg  07/02/19 174.9 kg   Metabolic Alkalosis: follow VBG  HTN: BP meds on hold  HLD: continue lipitor  Atrial Fibrillation: continue eliquis  Bipolar Disorder: continue depakote  ? cognitive deficit: Dwayne Bridges&Ox3, but niece suspects he may have undiagnosed autism.  Delirium precautions.  DVT prophylaxis: eliquis  Code Status: DNR Family Communication: niece 3/13 Disposition Plan:  . Patient came from: SNF            . Anticipated d/c place: SNF . Barriers to d/c OR conditions which need to be met to effect [name] safe d/c: pending improvement in oxygenation, blood pressure  Consultants:   Critical Care  Procedures:  Echo IMPRESSIONS    1. Left ventricular ejection fraction, by estimation, is 60 to 65%. The  left ventricle has normal function. The left ventricle has no regional  wall motion abnormalities.  2. Right ventricular systolic function is normal. The right ventricular  size is normal. There is mildly elevated pulmonary artery systolic  pressure.  3. The mitral valve is normal in structure. Mild mitral valve  regurgitation. No evidence of mitral stenosis.  4. The aortic valve is normal in structure. Aortic valve regurgitation is  not visualized. No aortic stenosis is present.  5. The inferior vena cava is normal in size with greater than 50%  respiratory variability, suggesting  right atrial pressure of 3 mmHg.   Antimicrobials: Anti-infectives (From admission, onward)   Start     Dose/Rate Route Frequency Ordered Stop   01/18/20 1400  ceFEPIme (MAXIPIME) 2 g in sodium chloride 0.9 % 100 mL IVPB     2 g 200 mL/hr over 30 Minutes Intravenous Every 8 hours 01/18/20 1012 01/25/20 1359   01/18/20 1015  metroNIDAZOLE (FLAGYL) IVPB 500 mg     500  mg 100 mL/hr over 60 Minutes Intravenous Every 8 hours 01/18/20 1012 01/25/20 1014   01/18/20 0900  ceFEPIme (MAXIPIME) 2 g in sodium chloride 0.9 % 100 mL IVPB  Status:  Discontinued     2 g 200 mL/hr over 30 Minutes Intravenous Every 12 hours 01/18/20 0505 01/18/20 1012   02/10/2020 2130  vancomycin (VANCOCIN) IVPB 1000 mg/200 mL premix  Status:  Discontinued     1,000 mg 200 mL/hr over 60 Minutes Intravenous  Once 01/11/2020 2116 02/08/2020 2128   01/24/2020 2130  ceFEPIme (MAXIPIME) 2 g in sodium chloride 0.9 % 100 mL IVPB     2 g 200 mL/hr over 30 Minutes Intravenous  Once 01/23/2020 2116 01/30/2020 2219   02/07/2020 2130  vancomycin (VANCOREADY) IVPB 2000 mg/400 mL     2,000 mg 200 mL/hr over 120 Minutes Intravenous  Once 01/30/2020 2128 01/18/20 0038     Subjective: No new complaints No pain or discomfort  Objective: Vitals:   01/23/20 0942 01/23/20 1055 01/23/20 1101 01/23/20 1305  BP: (!) 141/88  124/85 123/87  Pulse: 94 (!) 120 (!) 106 (!) 102  Resp: 18 18 18 19   Temp: 98.2 F (36.8 C)  98.2 F (36.8 C) 98.3 F (36.8 C)  TempSrc: Oral  Oral Oral  SpO2: 95% (!) 67% 97% 95%  Weight:      Height:        Intake/Output Summary (Last 24 hours) at 01/23/2020 1656 Last data filed at 01/23/2020 1428 Gross per 24 hour  Intake 895.73 ml  Output 2050 ml  Net -1154.27 ml   Filed Weights   01/21/20 0500 01/22/20 0500 01/23/20 0500  Weight: 111.3 kg 111.1 kg 112.6 kg    Examination:  General: No acute distress. Cardiovascular: Heart sounds show Myliah Medel regular rate, and rhythm.  Lungs: Clear to auscultation bilaterally Abdomen: Soft, nontender, nondistended  Neurological: Alert and oriented 3. Moves all extremities 4 . Cranial nerves II through XII grossly intact. Skin: Warm and dry. No rashes or lesions. Extremities: bilateral LE edema, scrotal edema    Data Reviewed: I have personally reviewed following labs and imaging studies  CBC: Recent Labs  Lab 01/27/2020 2117  01/18/20 0410 01/19/20 0425 01/20/20 0258 01/21/20 0416 01/22/20 0538 01/23/20 0543  WBC 9.9   < > 9.1 9.2 7.3 7.5 8.4  NEUTROABS 7.7  --  8.1*  --  5.1 5.5 6.5  HGB 11.5*   < > 11.0* 10.9* 11.1* 10.7* 10.5*  HCT 34.8*   < > 34.0* 34.4* 34.6* 34.3* 33.8*  MCV 91.3   < > 92.4 93.2 92.8 93.7 96.3  PLT 201   < > 173 161 182 174 181   < > = values in this interval not displayed.   Basic Metabolic Panel: Recent Labs  Lab 01/19/20 0425 01/20/20 0258 01/21/20 0416 01/22/20 0538 01/23/20 0543  NA 135 135 137 137 140  K 3.4* 3.9 3.6 3.4* 4.4  CL 95* 95* 95* 96* 95*  CO2 28 34* 36* 35* 39*  GLUCOSE 124* 90 79  75 77  BUN 16 17 13 11 11   CREATININE 0.49* 0.45* 0.49* 0.42* 0.37*  CALCIUM 8.3* 8.2* 8.0* 8.2* 8.4*  MG 2.2 1.9 1.7 1.6* 1.9  PHOS 2.2* 1.6* 2.5 2.6 2.6   GFR: Estimated Creatinine Clearance: 109.6 mL/min (Numa Heatwole) (by C-G formula based on SCr of 0.37 mg/dL (L)). Liver Function Tests: Recent Labs  Lab 01/26/2020 2117 01/18/20 0410 01/21/20 0416 01/22/20 0538 01/23/20 0543  AST 26 23 16 17  14*  ALT 15 15 13 13 10   ALKPHOS 63 61 50 49 49  BILITOT 0.9 0.7 0.7 0.6 0.6  PROT 6.7 6.5 5.5* 5.4* 5.5*  ALBUMIN 2.7* 2.7* 2.1* 2.1* 2.2*   No results for input(s): LIPASE, AMYLASE in the last 168 hours. No results for input(s): AMMONIA in the last 168 hours. Coagulation Profile: Recent Labs  Lab 01/30/2020 2117  INR 1.6*   Cardiac Enzymes: Recent Labs  Lab 02/05/2020 2117  CKTOTAL 104   BNP (last 3 results) No results for input(s): PROBNP in the last 8760 hours. HbA1C: No results for input(s): HGBA1C in the last 72 hours. CBG: Recent Labs  Lab 01/18/20 0052  GLUCAP 80   Lipid Profile: No results for input(s): CHOL, HDL, LDLCALC, TRIG, CHOLHDL, LDLDIRECT in the last 72 hours. Thyroid Function Tests: No results for input(s): TSH, T4TOTAL, FREET4, T3FREE, THYROIDAB in the last 72 hours. Anemia Panel: No results for input(s): VITAMINB12, FOLATE, FERRITIN, TIBC, IRON,  RETICCTPCT in the last 72 hours. Sepsis Labs: Recent Labs  Lab 01/21/2020 2117 01/13/2020 2339 01/18/20 0157 01/18/20 0410  PROCALCITON 0.16  --   --   --   LATICACIDVEN 3.5* 1.3 1.0 1.9    Recent Results (from the past 240 hour(s))  Blood Culture (routine x 2)     Status: None   Collection Time: 01/16/2020  9:31 PM   Specimen: BLOOD  Result Value Ref Range Status   Specimen Description BLOOD LEFT ANTECUBITAL  Final   Special Requests   Final    BOTTLES DRAWN AEROBIC AND ANAEROBIC Blood Culture adequate volume   Culture   Final    NO GROWTH 5 DAYS Performed at [facility], [address] [place], [place] [address]    Report Status 01/22/2020 FINAL  Final  Blood Culture (routine x 2)     Status: None   Collection Time: 02/03/2020  9:36 PM   Specimen: BLOOD  Result Value Ref Range Status   Specimen Description BLOOD LEFT HAND  Final   Special Requests   Final    BOTTLES DRAWN AEROBIC AND ANAEROBIC Blood Culture adequate volume   Culture   Final    NO GROWTH 5 DAYS Performed at [facility], [address], [place], [place] [address]    Report Status 01/22/2020 FINAL  Final  Urine culture     Status: None   Collection Time: 01/16/2020  9:54 PM   Specimen: In/Out Cath Urine  Result Value Ref Range Status   Specimen Description   Final    IN/OUT CATH URINE Performed at [facility], [address], [place], [place] [address]    Special Requests   Final    NONE Performed at [facility], [address] [place], [place] [address]    Culture   Final    NO GROWTH Performed at [facility], [address] [place], [place] [address]    Report Status 01/19/2020 FINAL  Final  Respiratory Panel by RT PCR (Flu Adem Costlow&B, Covid) -  Nasopharyngeal Swab     Status: None   Collection Time: 01/14/2020  9:54 PM   Specimen: Nasopharyngeal Swab  Result Value Ref Range Status   SARS Coronavirus 2 by RT PCR NEGATIVE NEGATIVE Final    Comment:  (NOTE) SARS-CoV-2 target nucleic acids are NOT DETECTED. The SARS-CoV-2 RNA is generally detectable in upper respiratoy specimens during the acute phase of infection. The lowest concentration of SARS-CoV-2 viral copies this assay can detect is 131 copies/mL. Emrys Mceachron negative result does not preclude SARS-Cov-2 infection and should not be used as the sole basis for treatment or other patient management decisions. Moise Friday negative result may occur with  improper specimen collection/handling, submission of specimen other than nasopharyngeal swab, presence of viral mutation(s) within the areas targeted by this assay, and inadequate number of viral copies (<131 copies/mL). Herbert Marken negative result must be combined with clinical observations, patient history, and epidemiological information. The expected result is Negative. Fact Sheet for Patients:  PinkCheek.be Fact Sheet for Healthcare Providers:  GravelBags.it This test is not yet ap proved or cleared by the [place] FDA and  has been authorized for detection and/or diagnosis of SARS-CoV-2 by FDA under an Emergency Use Authorization (EUA). This EUA will remain  in effect (meaning this test can be used) for the duration of the COVID-19 declaration under Section 564(b)(1) of the Act, 21 U.S.C. section 360bbb-3(b)(1), unless the authorization is terminated or revoked sooner.    Influenza Edric Fetterman by PCR NEGATIVE NEGATIVE Final   Influenza B by PCR NEGATIVE NEGATIVE Final    Comment: (NOTE) The Xpert Xpress SARS-CoV-2/FLU/RSV assay is intended as an aid in  the diagnosis of influenza from Nasopharyngeal swab specimens and  should not be used as Shenea Giacobbe sole basis for treatment. Nasal washings and  aspirates are unacceptable for Xpert Xpress SARS-CoV-2/FLU/RSV  testing. Fact Sheet for Patients: PinkCheek.be Fact Sheet for Healthcare  Providers: GravelBags.it This test is not yet approved or cleared by the [place] FDA and  has been authorized for detection and/or diagnosis of SARS-CoV-2 by  FDA under an Emergency Use Authorization (EUA). This EUA will remain  in effect (meaning this test can be used) for the duration of the  Covid-19 declaration under Section 564(b)(1) of the Act, 21  U.S.C. section 360bbb-3(b)(1), unless the authorization is  terminated or revoked. Performed at [facility], [address] [place], [place] [address]   MRSA PCR Screening     Status: None   Collection Time: 01/18/20 12:57 AM   Specimen: Nasopharyngeal  Result Value Ref Range Status   MRSA by PCR NEGATIVE NEGATIVE Final    Comment:        The GeneXpert MRSA Assay (FDA approved for NASAL specimens only), is one component of Jovan Schickling comprehensive MRSA colonization surveillance program. It is not intended to diagnose MRSA infection nor to guide or monitor treatment for MRSA infections. Performed at [facility], [address], [place], [place] [address]          Radiology Studies: DG Chest Alpha 1 View  Result Date: 01/23/2020 CLINICAL DATA:  Hypoxia EXAM: PORTABLE CHEST 1 VIEW COMPARISON:  Eight days ago FINDINGS: Cardiomegaly and vascular pedicle widening. Increased hazy appearance of the right chest. There is vascular congestion on both sides. IMPRESSION: 1. Increased hazy opacity of the right chest, suspect increase or layering of Madelena Maturin right pleural effusion. 2. Cardiomegaly and vascular congestion. Electronically Signed   By: Monte Fantasia M.D.   On: 01/23/2020 06:20  Scheduled Meds: . apixaban  5 mg Oral BID  . atorvastatin  20 mg Oral QHS  . Chlorhexidine Gluconate Cloth  6 each Topical Daily  . divalproex  375 mg Oral BID  . feeding supplement (ENSURE ENLIVE)  237 mL Oral BID BM  . furosemide  40 mg Intravenous Once  . [START ON 01/24/2020] furosemide  40  mg Intravenous BID  . senna-docusate  1 tablet Oral BID   Continuous Infusions: . sodium chloride 10 mL/hr at 01/23/20 1430  . ceFEPime (MAXIPIME) IV 2 g (01/23/20 1432)  . metronidazole Stopped (01/23/20 1132)     LOS: 5 days    Time spent: over 30 min    Fayrene Helper, MD [facility]   To contact the attending provider between 7A-7P or the covering provider during after hours 7P-7A, please log into the web site [email or web address] and access using universal [place] password for that web site. If you do not have the password, please call the hospital operator.  01/23/2020, 4:56 PM

## 2020-01-23 NOTE — Progress Notes (Signed)
CRITICAL VALUE ALERT  Critical Value:  PCO2 72 on blood gas  Date & Time Notied:  01/23/20 1755  Provider Notified:Dr. Lowell Guitar   Orders Received/Actions taken: BiPAP at bedtime.

## 2020-01-24 ENCOUNTER — Inpatient Hospital Stay: Payer: No Typology Code available for payment source

## 2020-01-24 LAB — COMPREHENSIVE METABOLIC PANEL
ALT: 12 U/L (ref 0–44)
AST: 16 U/L (ref 15–41)
Albumin: 2.4 g/dL — ABNORMAL LOW (ref 3.5–5.0)
Alkaline Phosphatase: 58 U/L (ref 38–126)
Anion gap: 10 (ref 5–15)
BUN: 13 mg/dL (ref 8–23)
CO2: 40 mmol/L — ABNORMAL HIGH (ref 22–32)
Calcium: 8.7 mg/dL — ABNORMAL LOW (ref 8.9–10.3)
Chloride: 91 mmol/L — ABNORMAL LOW (ref 98–111)
Creatinine, Ser: 0.4 mg/dL — ABNORMAL LOW (ref 0.61–1.24)
GFR calc Af Amer: >60 mL/min (ref >60)
GFR calc non Af Amer: >60 mL/min (ref >60)
Glucose, Bld: 134 mg/dL — ABNORMAL HIGH (ref 70–99)
Potassium: 4.3 mmol/L (ref 3.5–5.1)
Sodium: 141 mmol/L (ref 135–145)
Total Bilirubin: 0.8 mg/dL (ref 0.3–1.2)
Total Protein: 6.2 g/dL — ABNORMAL LOW (ref 6.5–8.1)

## 2020-01-24 LAB — BLOOD GAS, VENOUS
Acid-Base Excess: 20.7 mmol/L — ABNORMAL HIGH (ref 0.0–2.0)
Bicarbonate: 50.1 mmol/L — ABNORMAL HIGH (ref 20.0–28.0)
FIO2: 0.6
O2 Saturation: 99.6 %
Patient temperature: 37
pCO2, Ven: 79 mmHg (ref 44.0–60.0)
pH, Ven: 7.41 (ref 7.250–7.430)
pO2, Ven: 184 mmHg — ABNORMAL HIGH (ref 32.0–45.0)

## 2020-01-24 LAB — CBC WITH DIFFERENTIAL/PLATELET
Abs Immature Granulocytes: 0.22 10*3/uL — ABNORMAL HIGH (ref 0.00–0.07)
Basophils Absolute: 0.1 10*3/uL (ref 0.0–0.1)
Basophils Relative: 1 %
Eosinophils Absolute: 0 10*3/uL (ref 0.0–0.5)
Eosinophils Relative: 0 %
HCT: 36.8 % — ABNORMAL LOW (ref 39.0–52.0)
Hemoglobin: 11.6 g/dL — ABNORMAL LOW (ref 13.0–17.0)
Immature Granulocytes: 2 %
Lymphocytes Relative: 6 %
Lymphs Abs: 0.8 10*3/uL (ref 0.7–4.0)
MCH: 30.3 pg (ref 26.0–34.0)
MCHC: 31.5 g/dL (ref 30.0–36.0)
MCV: 96.1 fL (ref 80.0–100.0)
Monocytes Absolute: 0.9 10*3/uL (ref 0.1–1.0)
Monocytes Relative: 6 %
Neutro Abs: 11.7 10*3/uL — ABNORMAL HIGH (ref 1.7–7.7)
Neutrophils Relative %: 85 %
Platelets: 208 10*3/uL (ref 150–400)
RBC: 3.83 MIL/uL — ABNORMAL LOW (ref 4.22–5.81)
RDW: 15.7 % — ABNORMAL HIGH (ref 11.5–15.5)
WBC: 13.6 10*3/uL — ABNORMAL HIGH (ref 4.0–10.5)
nRBC: 0 % (ref 0.0–0.2)

## 2020-01-24 LAB — BASIC METABOLIC PANEL
Anion gap: 10 (ref 5–15)
BUN: 13 mg/dL (ref 8–23)
CO2: 44 mmol/L — ABNORMAL HIGH (ref 22–32)
Calcium: 8.3 mg/dL — ABNORMAL LOW (ref 8.9–10.3)
Chloride: 89 mmol/L — ABNORMAL LOW (ref 98–111)
Creatinine, Ser: 0.47 mg/dL — ABNORMAL LOW (ref 0.61–1.24)
GFR calc Af Amer: >60 mL/min (ref >60)
GFR calc non Af Amer: >60 mL/min (ref >60)
Glucose, Bld: 125 mg/dL — ABNORMAL HIGH (ref 70–99)
Potassium: 3.7 mmol/L (ref 3.5–5.1)
Sodium: 143 mmol/L (ref 135–145)

## 2020-01-24 LAB — PHOSPHORUS: Phosphorus: 2.9 mg/dL (ref 2.5–4.6)

## 2020-01-24 LAB — BRAIN NATRIURETIC PEPTIDE: B Natriuretic Peptide: 303 pg/mL — ABNORMAL HIGH (ref 0.0–100.0)

## 2020-01-24 LAB — GLUCOSE, CAPILLARY: Glucose-Capillary: 187 mg/dL — ABNORMAL HIGH (ref 70–99)

## 2020-01-24 LAB — MAGNESIUM: Magnesium: 1.6 mg/dL — ABNORMAL LOW (ref 1.7–2.4)

## 2020-01-24 MED ORDER — MAGNESIUM SULFATE 2 GM/50ML IV SOLN
2.0000 g | Freq: Once | INTRAVENOUS | Status: AC
  Administered 2020-01-24: 2 g via INTRAVENOUS
  Filled 2020-01-24: qty 50

## 2020-01-24 MED ORDER — FUROSEMIDE 10 MG/ML IJ SOLN: 40.0000 mg | Freq: Two times a day (BID) | INTRAMUSCULAR | Status: DC

## 2020-01-24 MED ORDER — FUROSEMIDE 10 MG/ML IJ SOLN
40.0000 mg | Freq: Three times a day (TID) | INTRAMUSCULAR | Status: DC
  Administered 2020-01-24 – 2020-01-25 (×3): 40 mg via INTRAVENOUS
  Filled 2020-01-24 (×3): qty 4

## 2020-01-24 MED ORDER — FUROSEMIDE 10 MG/ML IJ SOLN: 40.0000 mg | Freq: Three times a day (TID) | INTRAMUSCULAR | Status: DC

## 2020-01-24 MED ORDER — FUROSEMIDE 10 MG/ML IJ SOLN
40.0000 mg | Freq: Four times a day (QID) | INTRAMUSCULAR | Status: DC
  Administered 2020-01-24 (×2): 40 mg via INTRAVENOUS
  Filled 2020-01-24 (×2): qty 4

## 2020-01-24 NOTE — Progress Notes (Signed)
During the midnight vitals check on patient, pt had O2 in 50s. RT was called to the bedside and MD notified. EKG was done prior and showed A fib RVR. Pt was on BiPAP when O2 was in the 50s. MD came to bedside and access and placed orders. Pt MEWS score went up to 4 and stayed 4 for a while. Nursing supervisor was amde aware of the situation. Pt was given two doses of cardizem about 3hrs apart with hospitalist permission. ABG showed CO2 up to 82. RT adjusted pt BiPAP and pt O2 came up to 90s. Pt mentation remain the same throughout the night. Per Hospitalist, pt does not need continuous BiPAP. Pt respirations remains high. No other signs of distress noted. Will pass on information unto oncoming nurse.

## 2020-01-24 NOTE — Progress Notes (Signed)
OVERNIGHT Was called to bedside by nurse who reported patietn with atrial fib RVR rhythm on monitor and oxygen sats in 50's.  Bedside patient had already been placed on bipap from his cpap per RT, (also at bedside).  Patient alert, with some abdominal muscle breathing pattern that did improve some after head of bead raised. He reports breathing not bad. Pressure stable and atrial fib with ventricular rate 123 on monitor.   ABG shows chronic compensated respiratory acidosis with hypercapnia in the 70's that appears to be close to baseline for patient as evident by past review.   Chest xray with increased vascular congestin, Diuretic therapy increased frequency to every 8 hours. Patient remains stable on bipap 14/8 with 12l bled in oxygen. Pans to wean back to nasal cannula supplementation with breakfast

## 2020-01-24 NOTE — Progress Notes (Addendum)
Dwayne Bridges  YKZ:[id number] DOB: March 09, 1951 DOA: 01/26/2020 PCP: Patient, [name] Per   Brief Narrative:  69 yo with hx of HTN, HLD, GERD, anxiety, dHF, bipolar disorder, COVID infectioon in May 2020, atrial fibrillation on eliquis who presented with hypotension and decreased PO intake.  He was recently discharged on 3/2, he'd been treated for HF exacerbation at that admission and was diuresed.  He represented with hypotension and concern for possible sepsis from pneumonia.  He was admitted to the ICU due to persistent hypotension despite aggressive IVF.  He's been transferred back to the hospitalist service on 3/10.  Assessment & Plan:   Active Problems:   HTN (hypertension)   BPH (benign prostatic hyperplasia)   HLD (hyperlipidemia)   Atrial fibrillation, chronic (HCC)   Bipolar disorder, unspecified (HCC)   AKI (acute kidney injury) ([facility])   Dehydration   Hypotension   Hypokalemia   Abdominal distension   Hyponatremia   Septic shock (HCC)   Hypoxia  Acute Hypoxic Respiratory Failure 2/2 Pneumonia and Heart Failure: Continue abx as below Requiring 2 L this AM -> wean as tolerated Worsening hypoxia overnight while on bipap -> CXR with worsening edema -> lasix frequency increased CXR 3/14 am with improved edema Continue lasix and nightly bipap  HFpEF Exacerbation: echo from 2/26 with EF 60-65%, indeterminate LV diastolic parameters (see report).  Holding diuresis at this time due to hypotension at presentation. Repeat echo with findings of R heart dysfunction on CT scan - repeat echo with mildly elevate pulmonary artery systolic pressure Follow I/O, daily weights (net positive 4 L and weight up to 109.3 kg --- he was 99.6 kg on 3/2 day of discharge) Continue lasix - penile edema, LE edema, elevated BNP, will continue to monitor Continue bipap at night  Wt Readings from Last 3 Encounters:  01/24/20 109.3 kg  01/12/20 99.6 kg  07/02/19 939.0 kg   Metabolic  Acidosis  Chronic Hypercarbic Respiratory Failure:  Continue nightly bipap.  ? Underlying COPD?  Hypotension secondary to Septic Shock vs Overdiuresis  Bilateral Lower Lobe Pneumonia:  Continue cefepime (3/7 - present), flagyl (3/8 - present) - plan for 7 days Blood cx NGTD, urine cx negative I/O, daily weights.  IS, flutter valve. SLP eval -> recommending mechanical soft  Metabolic Alkalosis: follow VBG - with chronic resp acidosis  HTN: BP meds on hold  HLD: continue lipitor  Atrial Fibrillation: continue eliquis.  Continue diltiazem and prn metop.  Bipolar Disorder: continue depakote  ? cognitive deficit: Lillith Mcneff&Ox3, but niece suspects he may have undiagnosed autism.  Delirium precautions.  DVT prophylaxis: eliquis  Code Status: DNR Family Communication: niece 3/14 Disposition Plan:  . Patient came from: SNF            . Anticipated d/c place: SNF . Barriers to d/c OR conditions which need to be met to effect Jahvier Aldea safe d/c: pending improvement in oxygenation, blood pressure  Consultants:   Critical Care  Procedures:  Echo IMPRESSIONS    1. Left ventricular ejection fraction, by estimation, is 60 to 65%. The  left ventricle has normal function. The left ventricle has no regional  wall motion abnormalities.  2. Right ventricular systolic function is normal. The right ventricular  size is normal. There is mildly elevated pulmonary artery systolic  pressure.  3. The mitral valve is normal in structure. Mild mitral valve  regurgitation. No evidence of mitral stenosis.  4. The aortic valve is normal in structure. Aortic  valve regurgitation is  not visualized. No aortic stenosis is present.  5. The inferior vena cava is normal in size with greater than 50%  respiratory variability, suggesting right atrial pressure of 3 mmHg.   Antimicrobials: Anti-infectives (From admission, onward)   Start     Dose/Rate Route Frequency Ordered Stop   01/18/20 1400  ceFEPIme  (MAXIPIME) 2 g in sodium chloride 0.9 % 100 mL IVPB     2 g 200 mL/hr over 30 Minutes Intravenous Every 8 hours 01/18/20 1012 01/25/20 1359   01/18/20 1015  metroNIDAZOLE (FLAGYL) IVPB 500 mg     500 mg 100 mL/hr over 60 Minutes Intravenous Every 8 hours 01/18/20 1012 01/25/20 1014   01/18/20 0900  ceFEPIme (MAXIPIME) 2 g in sodium chloride 0.9 % 100 mL IVPB  Status:  Discontinued     2 g 200 mL/hr over 30 Minutes Intravenous Every 12 hours 01/18/20 0505 01/18/20 1012   01/27/2020 2130  vancomycin (VANCOCIN) IVPB 1000 mg/200 mL premix  Status:  Discontinued     1,000 mg 200 mL/hr over 60 Minutes Intravenous  Once 02/02/2020 2116 01/18/2020 2128   01/30/2020 2130  ceFEPIme (MAXIPIME) 2 g in sodium chloride 0.9 % 100 mL IVPB     2 g 200 mL/hr over 30 Minutes Intravenous  Once 02/02/2020 2116 02/01/2020 2219   02/07/2020 2130  vancomycin (VANCOREADY) IVPB 2000 mg/400 mL     2,000 mg 200 mL/hr over 120 Minutes Intravenous  Once 01/18/2020 2128 01/18/20 0038     Subjective: Brittne Kawasaki&O Sleepy (per nursing, didn't sleep well last night) No pain  Objective: Vitals:   01/24/20 0654 01/24/20 0827 01/24/20 1041 01/24/20 1259  BP: 119/74 110/74 120/64 (!) 146/76  Pulse: (!) 105 88 97 (!) 101  Resp: 20 18 (!) 28 (!) 29  Temp: 97.9 F (36.6 C) (!) 97.3 F (36.3 C) 99.1 F (37.3 C) 98.8 F (37.1 C)  TempSrc:  Oral Oral Axillary  SpO2: 90% 90% (!) 89% (!) 89%  Weight:    109.3 kg  Height:        Intake/Output Summary (Last 24 hours) at 01/24/2020 1544 Last data filed at 01/24/2020 1055 Gross per 24 hour  Intake 1150 ml  Output 3350 ml  Net -2200 ml   Filed Weights   01/23/20 0500 01/24/20 0500 01/24/20 1259  Weight: 112.6 kg 109.7 kg 109.3 kg    Examination:  General: No acute distress. Cardiovascular: Heart sounds show Kaleeya Hancock regular rate, and rhythm Lungs: Clear to auscultation bilaterally Abdomen: Soft, nontender, nondistended  Neurological: Alert and oriented 3. Moves all extremities 4 . Cranial  nerves II through XII grossly intact. Skin: Warm and dry. No rashes or lesions. Extremities: bilateral LE edema     Data Reviewed: I have personally reviewed following labs and imaging studies  CBC: Recent Labs  Lab 01/19/20 0425 01/19/20 0425 01/20/20 0258 01/21/20 0416 01/22/20 0538 01/23/20 0543 01/24/20 0529  WBC 9.1   < > 9.2 7.3 7.5 8.4 13.6*  NEUTROABS 8.1*  --   --  5.1 5.5 6.5 11.7*  HGB 11.0*   < > 10.9* 11.1* 10.7* 10.5* 11.6*  HCT 34.0*   < > 34.4* 34.6* 34.3* 33.8* 36.8*  MCV 92.4   < > 93.2 92.8 93.7 96.3 96.1  PLT 173   < > 161 182 174 181 208   < > = values in this interval not displayed.   Basic Metabolic Panel: Recent Labs  Lab 01/20/20 0258 01/21/20 0416 01/22/20  9030 01/23/20 0543 01/24/20 0529  NA 135 137 137 140 141  K 3.9 3.6 3.4* 4.4 4.3  CL 95* 95* 96* 95* 91*  CO2 34* 36* 35* 39* 40*  GLUCOSE 90 79 75 77 134*  BUN 17 13 11 11 13   CREATININE 0.45* 0.49* 0.42* 0.37* 0.40*  CALCIUM 8.2* 8.0* 8.2* 8.4* 8.7*  MG 1.9 1.7 1.6* 1.9 1.6*  PHOS 1.6* 2.5 2.6 2.6 2.9   GFR: Estimated Creatinine Clearance: 106.5 mL/min (Shephanie Romas) (by C-G formula based on SCr of 0.4 mg/dL (L)). Liver Function Tests: Recent Labs  Lab 01/18/20 0410 01/21/20 0416 01/22/20 0538 01/23/20 0543 01/24/20 0529  AST 23 16 17  14* 16  ALT 15 13 13 10 12   ALKPHOS 61 50 49 49 58  BILITOT 0.7 0.7 0.6 0.6 0.8  PROT 6.5 5.5* 5.4* 5.5* 6.2*  ALBUMIN 2.7* 2.1* 2.1* 2.2* 2.4*   No results for input(s): LIPASE, AMYLASE in the last 168 hours. No results for input(s): AMMONIA in the last 168 hours. Coagulation Profile: Recent Labs  Lab 01/21/2020 2117  INR 1.6*   Cardiac Enzymes: Recent Labs  Lab 01/14/2020 2117  CKTOTAL 104   BNP (last 3 results) No results for input(s): PROBNP in the last 8760 hours. HbA1C: No results for input(s): HGBA1C in the last 72 hours. CBG: Recent Labs  Lab 01/18/20 0052 01/24/20 1258  GLUCAP 80 187*   Lipid Profile: No results for  input(s): CHOL, HDL, LDLCALC, TRIG, CHOLHDL, LDLDIRECT in the last 72 hours. Thyroid Function Tests: No results for input(s): TSH, T4TOTAL, FREET4, T3FREE, THYROIDAB in the last 72 hours. Anemia Panel: No results for input(s): VITAMINB12, FOLATE, FERRITIN, TIBC, IRON, RETICCTPCT in the last 72 hours. Sepsis Labs: Recent Labs  Lab 01/19/2020 2117 01/12/2020 2339 01/18/20 0157 01/18/20 0410  PROCALCITON 0.16  --   --   --   LATICACIDVEN 3.5* 1.3 1.0 1.9    Recent Results (from the past 240 hour(s))  Blood Culture (routine x 2)     Status: None   Collection Time: 01/26/2020  9:31 PM   Specimen: BLOOD  Result Value Ref Range Status   Specimen Description BLOOD LEFT ANTECUBITAL  Final   Special Requests   Final    BOTTLES DRAWN AEROBIC AND ANAEROBIC Blood Culture adequate volume   Culture   Final    NO GROWTH 5 DAYS Performed at [facility], [address] [place], [place] [address]    Report Status 01/22/2020 FINAL  Final  Blood Culture (routine x 2)     Status: None   Collection Time: 02/10/2020  9:36 PM   Specimen: BLOOD  Result Value Ref Range Status   Specimen Description BLOOD LEFT HAND  Final   Special Requests   Final    BOTTLES DRAWN AEROBIC AND ANAEROBIC Blood Culture adequate volume   Culture   Final    NO GROWTH 5 DAYS Performed at [facility], [address], [place], [place] [address]    Report Status 01/22/2020 FINAL  Final  Urine culture     Status: None   Collection Time: 01/27/2020  9:54 PM   Specimen: In/Out Cath Urine  Result Value Ref Range Status   Specimen Description   Final    IN/OUT CATH URINE Performed at [facility], [address], [place], [place] [address]    Special Requests   Final    NONE Performed at [facility], [address], [place],  [address]  Culture   Final    NO GROWTH Performed at [facility], [place] [address], [place], [place] [address]    Report Status 01/19/2020 FINAL   Final  Respiratory Panel by RT PCR (Flu Delynda Sepulveda&B, Covid) - Nasopharyngeal Swab     Status: None   Collection Time: 02/04/2020  9:54 PM   Specimen: Nasopharyngeal Swab  Result Value Ref Range Status   SARS Coronavirus 2 by RT PCR NEGATIVE NEGATIVE Final    Comment: (NOTE) SARS-CoV-2 target nucleic acids are NOT DETECTED. The SARS-CoV-2 RNA is generally detectable in upper respiratoy specimens during the acute phase of infection. The lowest concentration of SARS-CoV-2 viral copies this assay can detect is 131 copies/mL. Kaysia Willard negative result does not preclude SARS-Cov-2 infection and should not be used as the sole basis for treatment or other patient management decisions. Japneet Staggs negative result may occur with  improper specimen collection/handling, submission of specimen other than nasopharyngeal swab, presence of viral mutation(s) within the areas targeted by this assay, and inadequate number of viral copies (<131 copies/mL). Taft Worthing negative result must be combined with clinical observations, patient history, and epidemiological information. The expected result is Negative. Fact Sheet for Patients:  PinkCheek.be Fact Sheet for Healthcare Providers:  GravelBags.it This test is not yet ap proved or cleared by the [place] FDA and  has been authorized for detection and/or diagnosis of SARS-CoV-2 by FDA under an Emergency Use Authorization (EUA). This EUA will remain  in effect (meaning this test can be used) for the duration of the COVID-19 declaration under Section 564(b)(1) of the Act, 21 U.S.C. section 360bbb-3(b)(1), unless the authorization is terminated or revoked sooner.    Influenza Anah Billard by PCR NEGATIVE NEGATIVE Final   Influenza B by PCR NEGATIVE NEGATIVE Final    Comment: (NOTE) The Xpert Xpress SARS-CoV-2/FLU/RSV assay is intended as an aid in  the diagnosis of influenza from Nasopharyngeal swab specimens and  should not be used as Yarelie Hams  sole basis for treatment. Nasal washings and  aspirates are unacceptable for Xpert Xpress SARS-CoV-2/FLU/RSV  testing. Fact Sheet for Patients: PinkCheek.be Fact Sheet for Healthcare Providers: GravelBags.it This test is not yet approved or cleared by the [place] FDA and  has been authorized for detection and/or diagnosis of SARS-CoV-2 by  FDA under an Emergency Use Authorization (EUA). This EUA will remain  in effect (meaning this test can be used) for the duration of the  Covid-19 declaration under Section 564(b)(1) of the Act, 21  U.S.C. section 360bbb-3(b)(1), unless the authorization is  terminated or revoked. Performed at [facility], [address] [place], [place] [address]   MRSA PCR Screening     Status: None   Collection Time: 01/18/20 12:57 AM   Specimen: Nasopharyngeal  Result Value Ref Range Status   MRSA by PCR NEGATIVE NEGATIVE Final    Comment:        The GeneXpert MRSA Assay (FDA approved for NASAL specimens only), is one component of [facility] comprehensive MRSA colonization surveillance program. It is not intended to diagnose MRSA infection nor to guide or monitor treatment for MRSA infections. Performed at [facility], [address], [place], [place] [address]          Radiology Studies: DG Chest 1 View  Result Date: 01/24/2020 CLINICAL DATA:  69 year old male with hypoxia. EXAM: CHEST  1 VIEW COMPARISON:  Chest radiograph dated 01/23/2020 FINDINGS: There is cardiomegaly with vascular congestion and edema. Overall interval worsening of the edema since  the earlier radiograph. Pneumonia is not excluded. Clinical correlation is recommended. Small bilateral pleural effusions. No pneumothorax. No acute osseous pathology. IMPRESSION: Cardiomegaly with interval worsening of the pulmonary edema. Electronically Signed   By: Anner Crete M.D.   On: 01/24/2020 01:23   DG Chest  Port 1 View  Result Date: 01/24/2020 CLINICAL DATA:  Hypoxia.  Follow-up exam. EXAM: PORTABLE CHEST 1 VIEW COMPARISON:  01/24/2020 at 1:01 Aubrina Nieman.m. FINDINGS: Hazy airspace lung opacities have improved from the earlier exam. There is persistent vascular congestion with mild interstitial thickening. Suspect small effusions. No new lung abnormalities.  No pneumothorax. IMPRESSION: 1. Improved pulmonary edema.  No new lung abnormalities. Electronically Signed   By: Lajean Manes M.D.   On: 01/24/2020 08:23   DG Chest Port 1 View  Result Date: 01/23/2020 CLINICAL DATA:  Hypoxia EXAM: PORTABLE CHEST 1 VIEW COMPARISON:  Eight days ago FINDINGS: Cardiomegaly and vascular pedicle widening. Increased hazy appearance of the right chest. There is vascular congestion on both sides. IMPRESSION: 1. Increased hazy opacity of the right chest, suspect increase or layering of Nikholas Geffre right pleural effusion. 2. Cardiomegaly and vascular congestion. Electronically Signed   By: Monte Fantasia M.D.   On: 01/23/2020 06:20        Scheduled Meds: . apixaban  5 mg Oral BID  . atorvastatin  20 mg Oral QHS  . Chlorhexidine Gluconate Cloth  6 each Topical Daily  . diltiazem  60 mg Oral Q6H  . divalproex  375 mg Oral BID  . feeding supplement (ENSURE ENLIVE)  237 mL Oral BID BM  . furosemide  40 mg Intravenous Q6H  . senna-docusate  1 tablet Oral BID   Continuous Infusions: . sodium chloride 10 mL/hr at 01/24/20 1056  . ceFEPime (MAXIPIME) IV Stopped (01/24/20 4469)  . metronidazole 500 mg (01/24/20 1058)     LOS: 6 days    Time spent: over 71 min    Fayrene Helper, MD [facility]   To contact the attending provider between 7A-7P or the covering provider during after hours 7P-7A, please log into the web site [email or web address] and access using universal [place] password for that web site. If you do not have the password, please call the hospital operator.  01/24/2020, 3:44 PM

## 2020-01-24 NOTE — Progress Notes (Signed)
Around 1040 prior to medication pass noted that patient drowsy and lethargic, but easily aroused.  Resp 29, O2 sats 89% on 2L O2 per [place].  Rest of VSS.  Pt able to take his meds and to drink Ensure. Notified Dr Lowell Guitar of the above.  MD came to see the pt and placed orders to transfer pt to stepdown for continuous BiPAP.  Pt's niece, Gennaro Africa notified and was calm and understanding.

## 2020-01-25 ENCOUNTER — Inpatient Hospital Stay: Payer: No Typology Code available for payment source

## 2020-01-25 LAB — COMPREHENSIVE METABOLIC PANEL
ALT: 11 U/L (ref 0–44)
AST: 13 U/L — ABNORMAL LOW (ref 15–41)
Albumin: 2.4 g/dL — ABNORMAL LOW (ref 3.5–5.0)
Alkaline Phosphatase: 50 U/L (ref 38–126)
Anion gap: 11 (ref 5–15)
BUN: 13 mg/dL (ref 8–23)
CO2: 45 mmol/L — ABNORMAL HIGH (ref 22–32)
Calcium: 8.5 mg/dL — ABNORMAL LOW (ref 8.9–10.3)
Chloride: 88 mmol/L — ABNORMAL LOW (ref 98–111)
Creatinine, Ser: 0.4 mg/dL — ABNORMAL LOW (ref 0.61–1.24)
GFR calc Af Amer: >60 mL/min (ref >60)
GFR calc non Af Amer: >60 mL/min (ref >60)
Glucose, Bld: 101 mg/dL — ABNORMAL HIGH (ref 70–99)
Potassium: 3.4 mmol/L — ABNORMAL LOW (ref 3.5–5.1)
Sodium: 144 mmol/L (ref 135–145)
Total Bilirubin: 0.8 mg/dL (ref 0.3–1.2)
Total Protein: 6 g/dL — ABNORMAL LOW (ref 6.5–8.1)

## 2020-01-25 LAB — BRAIN NATRIURETIC PEPTIDE: B Natriuretic Peptide: 247 pg/mL — ABNORMAL HIGH (ref 0.0–100.0)

## 2020-01-25 LAB — CBC WITH DIFFERENTIAL/PLATELET
Abs Immature Granulocytes: 0.11 10*3/uL — ABNORMAL HIGH (ref 0.00–0.07)
Basophils Absolute: 0.1 10*3/uL (ref 0.0–0.1)
Basophils Relative: 1 %
Eosinophils Absolute: 0.1 10*3/uL (ref 0.0–0.5)
Eosinophils Relative: 1 %
HCT: 33.5 % — ABNORMAL LOW (ref 39.0–52.0)
Hemoglobin: 10.3 g/dL — ABNORMAL LOW (ref 13.0–17.0)
Immature Granulocytes: 1 %
Lymphocytes Relative: 10 %
Lymphs Abs: 1 10*3/uL (ref 0.7–4.0)
MCH: 29.8 pg (ref 26.0–34.0)
MCHC: 30.7 g/dL (ref 30.0–36.0)
MCV: 96.8 fL (ref 80.0–100.0)
Monocytes Absolute: 0.8 10*3/uL (ref 0.1–1.0)
Monocytes Relative: 8 %
Neutro Abs: 7.9 10*3/uL — ABNORMAL HIGH (ref 1.7–7.7)
Neutrophils Relative %: 79 %
Platelets: 161 10*3/uL (ref 150–400)
RBC: 3.46 MIL/uL — ABNORMAL LOW (ref 4.22–5.81)
RDW: 15.8 % — ABNORMAL HIGH (ref 11.5–15.5)
WBC: 10.1 10*3/uL (ref 4.0–10.5)
nRBC: 0 % (ref 0.0–0.2)

## 2020-01-25 LAB — PHOSPHORUS: Phosphorus: 2.1 mg/dL — ABNORMAL LOW (ref 2.5–4.6)

## 2020-01-25 LAB — MAGNESIUM: Magnesium: 1.8 mg/dL (ref 1.7–2.4)

## 2020-01-25 MED ORDER — FUROSEMIDE 10 MG/ML IJ SOLN: 40.0000 mg | Freq: Three times a day (TID) | INTRAMUSCULAR | Status: DC

## 2020-01-25 MED ORDER — POLYVINYL ALCOHOL 1.4 % OP SOLN
1.0000 [drp] | Freq: Four times a day (QID) | OPHTHALMIC | Status: DC
  Administered 2020-01-25 – 2020-01-30 (×20): 1 [drp] via OPHTHALMIC
  Filled 2020-01-25: qty 15

## 2020-01-25 MED ORDER — FUROSEMIDE 10 MG/ML IJ SOLN: 40.0000 mg | Freq: Two times a day (BID) | INTRAMUSCULAR | Status: DC

## 2020-01-25 MED ORDER — SODIUM CHLORIDE 0.9 % IV SOLN
3.0000 g | Freq: Four times a day (QID) | INTRAVENOUS | Status: DC
  Administered 2020-01-25 – 2020-01-26 (×3): 3 g via INTRAVENOUS
  Filled 2020-01-25: qty 8
  Filled 2020-01-25: qty 3
  Filled 2020-01-25: qty 8
  Filled 2020-01-25: qty 3
  Filled 2020-01-25 (×2): qty 8

## 2020-01-25 MED ORDER — K PHOS MONO-SOD PHOS DI & MONO 155-852-130 MG PO TABS
500.0000 mg | ORAL_TABLET | Freq: Four times a day (QID) | ORAL | Status: AC
  Administered 2020-01-25 – 2020-01-26 (×4): 500 mg via ORAL
  Filled 2020-01-25 (×4): qty 2

## 2020-01-25 MED ORDER — POTASSIUM CHLORIDE CRYS ER 20 MEQ PO TBCR
40.0000 meq | EXTENDED_RELEASE_TABLET | Freq: Once | ORAL | Status: AC
  Administered 2020-01-25: 40 meq via ORAL
  Filled 2020-01-25: qty 2

## 2020-01-25 NOTE — Progress Notes (Signed)
Pharmacy Antibiotic Note  Dwayne Bridges is a 69 y.o. male admitted on 02/01/2020 with hypoxic and hypercapnic respiratory failure from bilateral pneumonia. Patient admitted from [facility]. Patient with past medical history significant for Hypertension, Diastolic CHF, Bipolar Disorder, and Anxiety. MRSA PCR is negative. Pharmacy has been consulted for Unasyn dosing. He has been treated with cefepime and metronidazole but his CXR today shows Increasing opacification in the right hemithorax due to possible infection/consolidation  Plan:   Start Unasyn 3 grams IV every 6 hours  Height: 5\' 11"  (180.3 cm) Weight: 240 lb 15.4 oz (109.3 kg) IBW/kg (Calculated) : 75.3  Temp (24hrs), Avg:98.1 F (36.7 C), Min:97.5 F (36.4 C), Max:99.6 F (37.6 C)  Recent Labs  Lab 01/21/20 0416 01/21/20 0416 01/22/20 0538 01/23/20 0543 01/24/20 0529 01/24/20 1542 01/25/20 0440  WBC 7.3  --  7.5 8.4 13.6*  --  10.1  CREATININE 0.49*   < > 0.42* 0.37* 0.40* 0.47* 0.40*   < > = values in this interval not displayed.    Estimated Creatinine Clearance: 106.5 mL/min (A) (by C-G formula based on SCr of 0.4 mg/dL (L)).    No Known Allergies  Antimicrobials this admission: Vancomycin 3/7 x 1 Cefepime 3/7 >> 3/15 Metronidazole 3/7 >> 3/15 Unasyn 3/15 >>  Microbiology results: 3/7 BCx: no growth 3/7 UCx: no growth  3/8 MRSA PCR: negative  3/8 SARS Coronavirus 2: negative  3/8 Influenza A/B: negative   Thank you for allowing pharmacy to be a part of this patient's care.  5/8, PharmD 01/25/2020 5:25 PM

## 2020-01-25 NOTE — Progress Notes (Addendum)
PROGRESS NOTE    Dwayne Bridges  PYK:[id number] DOB: Nov 24, 1950 DOA: 01/22/2020 PCP: Patient, [name] Per   Brief Narrative:  69 yo with hx of HTN, HLD, GERD, anxiety, dHF, bipolar disorder, COVID infectioon in May 2020, atrial fibrillation on eliquis who presented with hypotension and decreased PO intake.  He was recently discharged on 3/2, he'd been treated for HF exacerbation at that admission and was diuresed.  He represented with hypotension and concern for possible sepsis from pneumonia.  He was admitted to the ICU due to persistent hypotension despite aggressive IVF.  He's been transferred back to the hospitalist service on 3/10.  Assessment & Plan:   Active Problems:   HTN (hypertension)   BPH (benign prostatic hyperplasia)   HLD (hyperlipidemia)   Atrial fibrillation, chronic (HCC)   Bipolar disorder, unspecified (HCC)   AKI (acute kidney injury) ([place])   Dehydration   Hypotension   Hypokalemia   Abdominal distension   Hyponatremia   Septic shock (HCC)   Hypoxia  Acute Hypoxic Respiratory Failure 2/2 Pneumonia and Heart Failure: S/p broad spectrum abx - due to increasing opacity below, will continue abx, narrow to unasyn and follow (? Aspiration) SLP eval pending  On bipap nightly, per nursing, on 3 L [place] during day today CXR 3/14 am with improved edema CXR 3/15 with findings concerning for HF vs atypical infection - increasing opacification in R hemithorax 2/2 increasing volume of pleural fluid and atelectatic change vs developing infection/consolidation Continue lasix and nightly bipap - will hold off on bipap tonight, ? aspiration  HFpEF Exacerbation: echo from 2/26 with EF 60-65%, indeterminate LV diastolic parameters (see report).  Holding diuresis at this time due to hypotension at presentation. Repeat echo with findings of R heart dysfunction on CT scan - repeat echo with mildly elevate pulmonary artery systolic pressure Follow I/O, daily weights (net positive 2.8 L  and weight up to 109.3 kg --- he was 99.6 kg on 3/2 day of discharge) Continue lasix - penile edema, LE edema, elevated BNP, will continue to monitor Continue bipap at night  Wt Readings from Last 3 Encounters:  01/24/20 109.3 kg  01/12/20 99.6 kg  07/02/19 539.7 kg   Metabolic Acidosis  Chronic Hypercarbic Respiratory Failure:  Continue nightly bipap.  ? Underlying COPD?  Hypotension secondary to Septic Shock vs Overdiuresis  Bilateral Lower Lobe Pneumonia:  Intermittent low BP's today, holding parameters for lasix - consider adding midodrine Continue cefepime (3/7 - present), flagyl (3/8 - present) - plan for 7 days (adding abx as noted above) Blood cx NGTD, urine cx negative I/O, daily weights.  IS, flutter valve. SLP eval -> recommending mechanical soft  Hypokalemia  Hypophosphatemia: replace and follow  Metabolic Alkalosis: follow VBG - with chronic resp acidosis.  Continue nightly bipap.  HTN: BP meds on hold  HLD: continue lipitor  Atrial Fibrillation: continue eliquis.  Continue diltiazem and prn metop.  Bipolar Disorder: continue depakote  ? cognitive deficit: Dwayne Bridges&Ox3, but niece suspects he may have undiagnosed autism.  Delirium precautions.  DVT prophylaxis: eliquis  Code Status: DNR Family Communication: niece 3/15 Disposition Plan:  . Patient came from: SNF            . Anticipated d/c place: SNF . Barriers to d/c OR conditions which need to be met to effect Dwayne Bridges safe d/c: pending improvement in oxygenation, blood pressure  Consultants:   Critical Care  Procedures:  Echo IMPRESSIONS    1. Left ventricular ejection fraction, by estimation, is  60 to 65%. The  left ventricle has normal function. The left ventricle has no regional  wall motion abnormalities.  2. Right ventricular systolic function is normal. The right ventricular  size is normal. There is mildly elevated pulmonary artery systolic  pressure.  3. The mitral valve is normal in structure.  Mild mitral valve  regurgitation. No evidence of mitral stenosis.  4. The aortic valve is normal in structure. Aortic valve regurgitation is  not visualized. No aortic stenosis is present.  5. The inferior vena cava is normal in size with greater than 50%  respiratory variability, suggesting right atrial pressure of 3 mmHg.   Antimicrobials: Anti-infectives (From admission, onward)   Start     Dose/Rate Route Frequency Ordered Stop   01/18/20 1400  ceFEPIme (MAXIPIME) 2 g in sodium chloride 0.9 % 100 mL IVPB     2 g 200 mL/hr over 30 Minutes Intravenous Every 8 hours 01/18/20 1012 01/25/20 1230   01/18/20 1015  metroNIDAZOLE (FLAGYL) IVPB 500 mg     500 mg 100 mL/hr over 60 Minutes Intravenous Every 8 hours 01/18/20 1012 01/25/20 1014   01/18/20 0900  ceFEPIme (MAXIPIME) 2 g in sodium chloride 0.9 % 100 mL IVPB  Status:  Discontinued     2 g 200 mL/hr over 30 Minutes Intravenous Every 12 hours 01/18/20 0505 01/18/20 1012   01/19/2020 2130  vancomycin (VANCOCIN) IVPB 1000 mg/200 mL premix  Status:  Discontinued     1,000 mg 200 mL/hr over 60 Minutes Intravenous  Once 01/11/2020 2116 01/19/2020 2128   01/28/2020 2130  ceFEPIme (MAXIPIME) 2 g in sodium chloride 0.9 % 100 mL IVPB     2 g 200 mL/hr over 30 Minutes Intravenous  Once 01/11/2020 2116 02/10/2020 2219   02/09/2020 2130  vancomycin (VANCOREADY) IVPB 2000 mg/400 mL     2,000 mg 200 mL/hr over 120 Minutes Intravenous  Once 02/09/2020 2128 01/18/20 0038     Subjective: On bipap, alert No complaints  Objective: Vitals:   01/25/20 1045 01/25/20 1200 01/25/20 1300 01/25/20 1400  BP: 111/66 (!) 85/62 95/73 112/77  Pulse:  62 77 95  Resp:  (!) 21 (!) 23 (!) 33  Temp:  97.7 F (36.5 C)    TempSrc:      SpO2:  93% 99% 97%  Weight:      Height:        Intake/Output Summary (Last 24 hours) at 01/25/2020 1700 Last data filed at 01/25/2020 1046 Gross per 24 hour  Intake 400 ml  Output 2675 ml  Net -2275 ml   Filed Weights   01/23/20  0500 01/24/20 0500 01/24/20 1259  Weight: 112.6 kg 109.7 kg 109.3 kg    Examination:  General: No acute distress. Cardiovascular: Heart sounds show Dwayne Bridges regular rate, and rhythm Lungs: Clear to auscultation bilaterally Abdomen: Soft, nontender, nondistended  Neurological: Alert and oriented 3. Moves all extremities 4 . Cranial nerves II through XII grossly intact. Skin: Warm and dry. No rashes or lesions. Extremities: bilateral LE edema     Data Reviewed: I have personally reviewed following labs and imaging studies  CBC: Recent Labs  Lab 01/21/20 0416 01/22/20 0538 01/23/20 0543 01/24/20 0529 01/25/20 0440  WBC 7.3 7.5 8.4 13.6* 10.1  NEUTROABS 5.1 5.5 6.5 11.7* 7.9*  HGB 11.1* 10.7* 10.5* 11.6* 10.3*  HCT 34.6* 34.3* 33.8* 36.8* 33.5*  MCV 92.8 93.7 96.3 96.1 96.8  PLT 182 174 181 208 004   Basic Metabolic Panel: Recent Labs  Lab 01/21/20 0416 01/21/20 0416 01/22/20 0538 01/23/20 0543 01/24/20 0529 01/24/20 1542 01/25/20 0440  NA 137   < > 137 140 141 143 144  K 3.6   < > 3.4* 4.4 4.3 3.7 3.4*  CL 95*   < > 96* 95* 91* 89* 88*  CO2 36*   < > 35* 39* 40* 44* 45*  GLUCOSE 79   < > 75 77 134* 125* 101*  BUN 13   < > 11 11 13 13 13   CREATININE 0.49*   < > 0.42* 0.37* 0.40* 0.47* 0.40*  CALCIUM 8.0*   < > 8.2* 8.4* 8.7* 8.3* 8.5*  MG 1.7  --  1.6* 1.9 1.6*  --  1.8  PHOS 2.5  --  2.6 2.6 2.9  --  2.1*   < > = values in this interval not displayed.   GFR: Estimated Creatinine Clearance: 106.5 mL/min (Keoni Risinger) (by C-G formula based on SCr of 0.4 mg/dL (L)). Liver Function Tests: Recent Labs  Lab 01/21/20 0416 01/22/20 0538 01/23/20 0543 01/24/20 0529 01/25/20 0440  AST 16 17 14* 16 13*  ALT 13 13 10 12 11   ALKPHOS 50 49 49 58 50  BILITOT 0.7 0.6 0.6 0.8 0.8  PROT 5.5* 5.4* 5.5* 6.2* 6.0*  ALBUMIN 2.1* 2.1* 2.2* 2.4* 2.4*   No results for input(s): LIPASE, AMYLASE in the last 168 hours. No results for input(s): AMMONIA in the last 168 hours. Coagulation  Profile: No results for input(s): INR, PROTIME in the last 168 hours. Cardiac Enzymes: No results for input(s): CKTOTAL, CKMB, CKMBINDEX, TROPONINI in the last 168 hours. BNP (last 3 results) No results for input(s): PROBNP in the last 8760 hours. HbA1C: No results for input(s): HGBA1C in the last 72 hours. CBG: Recent Labs  Lab 01/24/20 1258  GLUCAP 187*   Lipid Profile: No results for input(s): CHOL, HDL, LDLCALC, TRIG, CHOLHDL, LDLDIRECT in the last 72 hours. Thyroid Function Tests: No results for input(s): TSH, T4TOTAL, FREET4, T3FREE, THYROIDAB in the last 72 hours. Anemia Panel: No results for input(s): VITAMINB12, FOLATE, FERRITIN, TIBC, IRON, RETICCTPCT in the last 72 hours. Sepsis Labs: No results for input(s): PROCALCITON, LATICACIDVEN in the last 168 hours.  Recent Results (from the past 240 hour(s))  Blood Culture (routine x 2)     Status: None   Collection Time: 01/21/2020  9:31 PM   Specimen: BLOOD  Result Value Ref Range Status   Specimen Description BLOOD LEFT ANTECUBITAL  Final   Special Requests   Final    BOTTLES DRAWN AEROBIC AND ANAEROBIC Blood Culture adequate volume   Culture   Final    NO GROWTH 5 DAYS Performed at [facility], [address], [place], [place] [address]    Report Status 01/22/2020 FINAL  Final  Blood Culture (routine x 2)     Status: None   Collection Time: 02/04/2020  9:36 PM   Specimen: BLOOD  Result Value Ref Range Status   Specimen Description BLOOD LEFT HAND  Final   Special Requests   Final    BOTTLES DRAWN AEROBIC AND ANAEROBIC Blood Culture adequate volume   Culture   Final    NO GROWTH 5 DAYS Performed at [facility], [address], [place], [place] [address]    Report Status 01/22/2020 FINAL  Final  Urine culture     Status: None   Collection Time: 02/02/2020  9:54 PM   Specimen: In/Out Cath Urine  Result Value Ref Range Status  Specimen Description   Final    IN/OUT CATH URINE Performed at  [facility], [address], [place], [place] [address]    Special Requests   Final    NONE Performed at [facility], [address], [place], [place] [address]    Culture   Final    NO GROWTH Performed at [facility], [place] [address] [place], [place] [address]    Report Status 01/19/2020 FINAL  Final  Respiratory Panel by RT PCR (Flu Shota Kohrs&B, Covid) - Nasopharyngeal Swab     Status: None   Collection Time: 01/22/2020  9:54 PM   Specimen: Nasopharyngeal Swab  Result Value Ref Range Status   SARS Coronavirus 2 by RT PCR NEGATIVE NEGATIVE Final    Comment: (NOTE) SARS-CoV-2 target nucleic acids are NOT DETECTED. The SARS-CoV-2 RNA is generally detectable in upper respiratoy specimens during the acute phase of infection. The lowest concentration of SARS-CoV-2 viral copies this assay can detect is 131 copies/mL. Jonathon Tan negative result does not preclude SARS-Cov-2 infection and should not be used as the sole basis for treatment or other patient management decisions. Renesmee Raine negative result may occur with  improper specimen collection/handling, submission of specimen other than nasopharyngeal swab, presence of viral mutation(s) within the areas targeted by this assay, and inadequate number of viral copies (<131 copies/mL). Arthurine Oleary negative result must be combined with clinical observations, patient history, and epidemiological information. The expected result is Negative. Fact Sheet for Patients:  PinkCheek.be Fact Sheet for Healthcare Providers:  GravelBags.it This test is not yet ap proved or cleared by the [place] FDA and  has been authorized for detection and/or diagnosis of SARS-CoV-2 by FDA under an Emergency Use Authorization (EUA). This EUA will remain  in effect (meaning this test can be used) for the duration of the COVID-19 declaration under Section 564(b)(1) of the Act, 21 U.S.C. section 360bbb-3(b)(1),  unless the authorization is terminated or revoked sooner.    Influenza Danniel Tones by PCR NEGATIVE NEGATIVE Final   Influenza B by PCR NEGATIVE NEGATIVE Final    Comment: (NOTE) The Xpert Xpress SARS-CoV-2/FLU/RSV assay is intended as an aid in  the diagnosis of influenza from Nasopharyngeal swab specimens and  should not be used as Laquashia Mergenthaler sole basis for treatment. Nasal washings and  aspirates are unacceptable for Xpert Xpress SARS-CoV-2/FLU/RSV  testing. Fact Sheet for Patients: PinkCheek.be Fact Sheet for Healthcare Providers: GravelBags.it This test is not yet approved or cleared by the [place] FDA and  has been authorized for detection and/or diagnosis of SARS-CoV-2 by  FDA under an Emergency Use Authorization (EUA). This EUA will remain  in effect (meaning this test can be used) for the duration of the  Covid-19 declaration under Section 564(b)(1) of the Act, 21  U.S.C. section 360bbb-3(b)(1), unless the authorization is  terminated or revoked. Performed at [facility], [address] [place], [place] [address]   MRSA PCR Screening     Status: None   Collection Time: 01/18/20 12:57 AM   Specimen: Nasopharyngeal  Result Value Ref Range Status   MRSA by PCR NEGATIVE NEGATIVE Final    Comment:        The GeneXpert MRSA Assay (FDA approved for NASAL specimens only), is one component of Kumari Sculley comprehensive MRSA colonization surveillance program. It is not intended to diagnose MRSA infection nor to guide or monitor treatment for MRSA infections. Performed at [facility], [address] [place], [place] [address]  Radiology Studies: DG Chest 1 View  Result Date: 01/24/2020 CLINICAL DATA:  69 year old male with hypoxia. EXAM: CHEST  1 VIEW COMPARISON:  Chest radiograph dated 01/23/2020 FINDINGS: There is cardiomegaly with vascular congestion and edema. Overall interval worsening of the edema  since the earlier radiograph. Pneumonia is not excluded. Clinical correlation is recommended. Small bilateral pleural effusions. No pneumothorax. No acute osseous pathology. IMPRESSION: Cardiomegaly with interval worsening of the pulmonary edema. Electronically Signed   By: Anner Crete M.D.   On: 01/24/2020 01:23   DG Chest Port 1 View  Result Date: 01/25/2020 CLINICAL DATA:  Hypoxia. EXAM: PORTABLE CHEST 1 VIEW COMPARISON:  01/24/2020 FINDINGS: Cardiomediastinal contours remain enlarged. Increased interstitial markings present bilaterally with hazy opacification in both the right and left hemithorax. Right hemidiaphragm is more obscured than on the previous exam. Visualized skeletal structures are unremarkable. IMPRESSION: Findings could be seen in the setting of CHF or atypical infection. Increasing opacification in the right hemithorax may be due to increasing volume of pleural fluid and associated atelectatic change versus is developing infection/consolidation. Electronically Signed   By: Zetta Bills M.D.   On: 01/25/2020 08:56   DG Chest Port 1 View  Result Date: 01/24/2020 CLINICAL DATA:  Hypoxia.  Follow-up exam. EXAM: PORTABLE CHEST 1 VIEW COMPARISON:  01/24/2020 at 1:01 Verdean Murin.m. FINDINGS: Hazy airspace lung opacities have improved from the earlier exam. There is persistent vascular congestion with mild interstitial thickening. Suspect small effusions. No new lung abnormalities.  No pneumothorax. IMPRESSION: 1. Improved pulmonary edema.  No new lung abnormalities. Electronically Signed   By: Lajean Manes M.D.   On: 01/24/2020 08:23        Scheduled Meds: . apixaban  5 mg Oral BID  . atorvastatin  20 mg Oral QHS  . Chlorhexidine Gluconate Cloth  6 each Topical Daily  . diltiazem  60 mg Oral Q6H  . divalproex  375 mg Oral BID  . feeding supplement (ENSURE ENLIVE)  237 mL Oral BID BM  . [START ON 01/26/2020] furosemide  40 mg Intravenous BID  . senna-docusate  1 tablet Oral BID    Continuous Infusions: . sodium chloride 10 mL/hr at 01/24/20 1056     LOS: 7 days    Time spent: over 30 min    Fayrene Helper, MD [facility]   To contact the attending provider between 7A-7P or the covering provider during after hours 7P-7A, please log into the web site [email or web address] and access using universal [place] password for that web site. If you do not have the password, please call the hospital operator.  01/25/2020, 5:00 PM

## 2020-01-26 ENCOUNTER — Inpatient Hospital Stay: Payer: No Typology Code available for payment source

## 2020-01-26 ENCOUNTER — Ambulatory Visit: Payer: No Typology Code available for payment source | Admitting: Family

## 2020-01-26 LAB — CBC WITH DIFFERENTIAL/PLATELET
Abs Immature Granulocytes: 0.14 10*3/uL — ABNORMAL HIGH (ref 0.00–0.07)
Basophils Absolute: 0 10*3/uL (ref 0.0–0.1)
Basophils Relative: 0 %
Eosinophils Absolute: 0.2 10*3/uL (ref 0.0–0.5)
Eosinophils Relative: 2 %
HCT: 31.9 % — ABNORMAL LOW (ref 39.0–52.0)
Hemoglobin: 10 g/dL — ABNORMAL LOW (ref 13.0–17.0)
Immature Granulocytes: 1 %
Lymphocytes Relative: 11 %
Lymphs Abs: 1.1 10*3/uL (ref 0.7–4.0)
MCH: 30.2 pg (ref 26.0–34.0)
MCHC: 31.3 g/dL (ref 30.0–36.0)
MCV: 96.4 fL (ref 80.0–100.0)
Monocytes Absolute: 0.8 10*3/uL (ref 0.1–1.0)
Monocytes Relative: 7 %
Neutro Abs: 8.2 10*3/uL — ABNORMAL HIGH (ref 1.7–7.7)
Neutrophils Relative %: 79 %
Platelets: 162 10*3/uL (ref 150–400)
RBC: 3.31 MIL/uL — ABNORMAL LOW (ref 4.22–5.81)
RDW: 15.7 % — ABNORMAL HIGH (ref 11.5–15.5)
WBC: 10.4 10*3/uL (ref 4.0–10.5)
nRBC: 0 % (ref 0.0–0.2)

## 2020-01-26 LAB — BLOOD GAS, VENOUS
Acid-Base Excess: 35.3 mmol/L — ABNORMAL HIGH (ref 0.0–2.0)
Acid-Base Excess: 24.3 mmol/L — ABNORMAL HIGH (ref 0.0–2.0)
Bicarbonate: 61.1 mmol/L — ABNORMAL HIGH (ref 20.0–28.0)
Bicarbonate: 55.1 mmol/L — ABNORMAL HIGH (ref 20.0–28.0)
FIO2: 0.44
O2 Saturation: 99.2 %
O2 Saturation: 95.6 %
Patient temperature: 37
Patient temperature: 37
pCO2, Ven: 53 mmHg (ref 44.0–60.0)
pCO2, Ven: 91 mmHg (ref 44.0–60.0)
pH, Ven: 7.67 (ref 7.250–7.430)
pH, Ven: 7.39 (ref 7.250–7.430)
pO2, Ven: 114 mmHg — ABNORMAL HIGH (ref 32.0–45.0)
pO2, Ven: 80 mmHg — ABNORMAL HIGH (ref 32.0–45.0)

## 2020-01-26 LAB — COMPREHENSIVE METABOLIC PANEL
ALT: 12 U/L (ref 0–44)
AST: 15 U/L (ref 15–41)
Albumin: 2.2 g/dL — ABNORMAL LOW (ref 3.5–5.0)
Alkaline Phosphatase: 50 U/L (ref 38–126)
Anion gap: 8 (ref 5–15)
BUN: 14 mg/dL (ref 8–23)
CO2: 50 mmol/L — ABNORMAL HIGH (ref 22–32)
Calcium: 8.4 mg/dL — ABNORMAL LOW (ref 8.9–10.3)
Chloride: 88 mmol/L — ABNORMAL LOW (ref 98–111)
Creatinine, Ser: 0.38 mg/dL — ABNORMAL LOW (ref 0.61–1.24)
GFR calc Af Amer: >60 mL/min (ref >60)
GFR calc non Af Amer: >60 mL/min (ref >60)
Glucose, Bld: 128 mg/dL — ABNORMAL HIGH (ref 70–99)
Potassium: 3.6 mmol/L (ref 3.5–5.1)
Sodium: 146 mmol/L — ABNORMAL HIGH (ref 135–145)
Total Bilirubin: 0.9 mg/dL (ref 0.3–1.2)
Total Protein: 6 g/dL — ABNORMAL LOW (ref 6.5–8.1)

## 2020-01-26 LAB — MAGNESIUM: Magnesium: 1.6 mg/dL — ABNORMAL LOW (ref 1.7–2.4)

## 2020-01-26 LAB — PHOSPHORUS: Phosphorus: 2.5 mg/dL (ref 2.5–4.6)

## 2020-01-26 MED ORDER — TORSEMIDE 20 MG PO TABS
20.0000 mg | ORAL_TABLET | Freq: Two times a day (BID) | ORAL | Status: DC
  Administered 2020-01-26 – 2020-01-29 (×6): 20 mg via ORAL
  Filled 2020-01-26 (×4): qty 1

## 2020-01-26 MED ORDER — MAGNESIUM SULFATE 2 GM/50ML IV SOLN
2.0000 g | Freq: Once | INTRAVENOUS | Status: AC
  Administered 2020-01-26: 2 g via INTRAVENOUS
  Filled 2020-01-26: qty 50

## 2020-01-26 MED ORDER — ALBUMIN HUMAN 25 % IV SOLN
12.5000 g | Freq: Once | INTRAVENOUS | Status: AC
  Administered 2020-01-26: 12.5 g via INTRAVENOUS
  Filled 2020-01-26: qty 50

## 2020-01-26 MED ORDER — ALBUMIN HUMAN 25 % IV SOLN
12.5000 g | Freq: Every day | INTRAVENOUS | Status: DC
  Administered 2020-01-26 – 2020-01-30 (×4): 12.5 g via INTRAVENOUS
  Filled 2020-01-26 (×5): qty 50

## 2020-01-26 MED ORDER — ACETAZOLAMIDE SODIUM 500 MG IJ SOLR
500.0000 mg | Freq: Once | INTRAMUSCULAR | Status: AC
  Administered 2020-01-26: 500 mg via INTRAVENOUS
  Filled 2020-01-26: qty 500

## 2020-01-26 NOTE — Progress Notes (Signed)
PCCM team no longer consulted, however VBG ordered for 05:00 am on 01/26/2020.  Respiratory therapist reported critical VBG result to PCCM team which revealed pH 7.67/pCO2 53/pO2 114/acid-base excess 35.3/bicarb 61.1; chloride 88; and albumin 2.4 consistent with severe metabolic alkalosis. This is due to over diuresis (40 mg of iv lasix 2 times daily currently ordered). Contacted hospitalist NP Manuela Schwartz regarding findings and recommended: discontinuing lasix; administer 500 mg iv diamox x1 dose; and administer 12.5 mg of 5% albumin iv x1 dose.  Case discussed with [facility] ICU physician Dr. Warrick Parisian who agrees with plan of care as outlined above.  Manuela Schwartz, NP acknowledged recommendations and will adjust medications.    Sonda Rumble, AGNP  Pulmonary/Critical Care Pager [phone] (please enter 7 digits) PCCM Consult Pager [phone] (please enter 7 digits)

## 2020-01-26 NOTE — Evaluation (Signed)
Clinical/Bedside Swallow Evaluation Patient Details  Name: Dwayne Bridges MRN: [id number] Date of Birth: Jun 14, 1951  Today's Date: 01/26/2020 Time: SLP Start Time (ACUTE ONLY): 9833 SLP Stop Time (ACUTE ONLY): 0925 SLP Time Calculation (min) (ACUTE ONLY): 50 min  Past Medical History:  Past Medical History:  Diagnosis Date  . Anxiety   . Bipolar disorder, unspecified (HCC)   . CHF (congestive heart failure) (HCC)   . Disorientation, unspecified   . GERD (gastroesophageal reflux disease)   . Hypertension   . Hypokalemia   . Sleep apnea, unspecified    Past Surgical History: History reviewed. No pertinent surgical history. HPI:  Pt is a 69 y.o. male with a history of hypertension, atrial fibrillation, GERD, CHF, Bipolar disorder, baseline social deficits per family - likely has Autism who was sent to the ED from [facility] due to hypotension.  Initial blood pressure at the facility was 56/33.  Multiple repeat checks by EMS have been in the range of 70/40.  Patient denies any acute complaints.  He is Lasix.  He was recently hospitalized for CHF exacerbation, discharged 5 days ago back to his nursing facility.  Reportedly patient's oral intake has been poor since returning home, and mental status has been declining.  CXR: "Low lung volumes but improved ventilation since last month. No acute abnormality". New CXR 01/26/2011: "Slight interval worsening of diffuse bilateral interstitial and airspace opacities which may represent pulmonary edema, ARDS, multifocal pneumonia, or some combination there of. Bilateral layering pleural effusions.".  Pt continues on Bipap nightly, per nursing, on 3 L [place] during day today; Lasix for edema per chart notes.  Pt remains in bed, sedentary.    Assessment / Plan / Recommendation Clinical Impression  ST services was asked to re-evaluate this pt for any concern of dysphagia. Pt seen post breakfast meal this AM; consulted NSG who said he took a few bites and  multiple sips of thin liquids via straw this morning w/ No overt s/s of aspiration noted - he does not eat much at breakfast it was reported. No deficits w/ Pill swallowing per NSG. Pt awake and verbally responsive upon entering room. Min positioning support given to support. Pt appears to present w/ adequate oropharyngeal phase swallow function w/ no neuromuscular deficits noted. Pt did exhibit Belching x2 which can increase risk for Retrograde bolus activity thus possible aspiration of Reflux material, and Pulmonary decline. Pt's risk for prandial aspiration is reduced following general aspiration precautions including sitting upright when eating/drinking, and drinking Slowly. No clinical, overt s/s of aspiration noted; no wet vocal quality or decline in respiratory status(O2 sats 90%). During the oral phase, pt exhibited adequate bolus management and mastication(of peaches-soft) w/ timely A-P transfer; oral clearing achieved w/ all trials. Pt fed self w/ setup support. OM exam was [facility] w/ no unilateral lingual weakness noted. Recommend a more mech soft/regular diet (cooked, moist foods, less dry particulates) d/t GERD and pt missing some Dentition, thin liquids. General aspiration and Reflux precautions. Pills in Whole in Puree for safer swallowing d/t extended illness. Tray setup and support at meals d/t weakness and for follow through w/ precautions. NSG updated. Precautions posted at bedside. Noted: pt drinks Sodas which have Carbonation which can increase Belching and retrograde activity of po's.  SLP Visit Diagnosis: Dysphagia, unspecified (R13.10)(few missing dentition impacting mastication of tougher solid)    Aspiration Risk  Mild aspiration risk;Risk for inadequate nutrition/hydration(reduced following general precs)    Diet Recommendation  Mech Soft diet w/  thin liquids (moist, Cut foods). General aspiration and Reflux precautions. Support at meals d/t weakness and for follow through w/ precautions  - monitor any impulsive drinking (want to Slow down)   Medication Administration: Whole meds with puree(for safer swallowing d/t extended illness)    Other  Recommendations Recommended Consults: Consider GI evaluation;Consider esophageal assessment(Dietician support) Oral Care Recommendations: Oral care BID;Oral care before and after PO;Staff/trained caregiver to provide oral care   Follow up Recommendations None      Frequency and Duration (n/a)  (n/a)       Prognosis Prognosis for Safe Diet Advancement: Good      Swallow Study   General Date of Onset: 01/12/2020 HPI: Pt is a 69 y.o. male with a history of hypertension, atrial fibrillation, GERD, CHF, Bipolar disorder, baseline social deficits per family - likely has Autism who was sent to the ED from [facility] due to hypotension.  Initial blood pressure at the facility was 56/33.  Multiple repeat checks by EMS have been in the range of 70/40.  Patient denies any acute complaints.  He is Lasix.  He was recently hospitalized for CHF exacerbation, discharged 5 days ago back to his nursing facility.  Reportedly patient's oral intake has been poor since returning home, and mental status has been declining.  CXR: "Low lung volumes but improved ventilation since last month. No acute abnormality". New CXR 01/26/2011: "Slight interval worsening of diffuse bilateral interstitial and airspace opacities which may represent pulmonary edema, ARDS, multifocal pneumonia, or some combination there of. Bilateral layering pleural effusions.".  Pt continues on Bipap nightly, per nursing, on 3 L [place] during day today; Lasix for edema per chart notes.  Pt remains in bed, sedentary.  Type of Study: Bedside Swallow Evaluation(MD requested reassessment today) Previous Swallow Assessment: 01/20/2020 Diet Prior to this Study: Dysphagia 3 (soft);Thin liquids(cut meats) Temperature Spikes Noted: No(wbc 10.4) Respiratory Status: Nasal cannula(3-4L) History of Recent  Intubation: No Behavior/Cognition: Alert;Cooperative;Pleasant mood;Distractible;Requires cueing Oral Cavity Assessment: Within Functional Limits Oral Care Completed by SLP: Yes Oral Cavity - Dentition: Adequate natural dentition;Missing dentition Vision: Functional for self-feeding Self-Feeding Abilities: Able to feed self;Needs assist;Needs set up(support d/t shaky UEs) Patient Positioning: Upright in bed(needed positioning support) Baseline Vocal Quality: Normal Volitional Cough: Strong Volitional Swallow: Able to elicit    Oral/Motor/Sensory Function Overall Oral Motor/Sensory Function: Within functional limits(grossly; no lingual weakness noted)   Ice Chips Ice chips: Within functional limits Presentation: Spoon(fed; 2 trials)   Thin Liquid Thin Liquid: Within functional limits Presentation: Cup;Self Fed;Straw(~6 ozs total)    Nectar Thick Nectar Thick Liquid: Not tested   Honey Thick Honey Thick Liquid: Not tested   Puree Puree: Not tested   Solid     Solid: Within functional limits(impacted by missing Dentition w/ tough solids) Presentation: Spoon(fed/supported; 4 trials) Oral Phase Impairments: (grossly WFL) Oral Phase Functional Implications: (grossly WFL) Pharyngeal Phase Impairments: (None)       Orinda Kenner, MS, CCC-SLP Latwan Luchsinger 01/26/2020,9:37 AM

## 2020-01-26 NOTE — Progress Notes (Addendum)
PROGRESS NOTE    Dwayne Bridges  ZOX:[id number] DOB: 07-16-1951 DOA: 02/10/2020 PCP: Patient, [name] Per   Brief Narrative:  69 yo with hx of HTN, HLD, GERD, anxiety, dHF, bipolar disorder, COVID infectioon in May 2020, atrial fibrillation on eliquis who presented with hypotension and decreased PO intake.  He was recently discharged on 3/2, he'd been treated for HF exacerbation at that admission and was diuresed.  He represented with hypotension and concern for possible sepsis from pneumonia.  He was admitted to the ICU due to persistent hypotension despite aggressive IVF.  He's been transferred back to the hospitalist service on 3/10.  He's been treated with IV antibiotics and gradually improved, though he had worsening peripheral edema.  He was restarted on diuresis.  He'd previously been discharged with nightly bipap and this was restarted as well.  He currently has severe metabolic acidosis which is complicating diuresis.  Bipap has been discontinued due to concerns for aspiration.  Assessment & Plan:   Active Problems:   HTN (hypertension)   BPH (benign prostatic hyperplasia)   HLD (hyperlipidemia)   Atrial fibrillation, chronic (HCC)   Bipolar disorder, unspecified (HCC)   AKI (acute kidney injury) ([place])   Dehydration   Hypotension   Hypokalemia   Abdominal distension   Hyponatremia   Septic shock (HCC)   Hypoxia  Acute Hypoxic Respiratory Failure 2/2 Pneumonia and Heart Failure: Currently requiring 4 L [place] S/p broad spectrum abx x7 days Lasix currently on hold due to metabolic alkalosis I discontinued his bipap last night with concern for aspiration on bipap (seemed hoarser, with wet voice/breath sounds after we'd started bipap)  Due to worsening CXR findings, will continue abx, narrow to unasyn and follow (? Aspiration) SLP eval pending -> mechanical soft with thin liquid (see note) CXR 3/14 am with improved edema CXR 3/15 with findings concerning for HF vs atypical infection  - increasing opacification in R hemithorax 2/2 increasing volume of pleural fluid and atelectatic change vs developing infection/consolidation CXR 3/16 with slight interval worsening of bilateral interstitial and airspace opacities concerning for edema (vs ARDS or pneumonia) Pulmonology c/s, appreciate recs  HFpEF Exacerbation: echo from 2/26 with EF 60-65%, indeterminate LV diastolic parameters (see report).  Holding diuresis at this time due to hypotension at presentation. Repeat echo with findings of R heart dysfunction on CT scan - repeat echo with mildly elevate pulmonary artery systolic pressure Follow I/O, daily weights (net positive 1.3 L and weight up to 109.3 kg on 3/14 --- he was 99.6 kg on 3/2 day of discharge) Lasix currently on hold - penile edema, LE edema, elevated BNP, will continue to monitor  Wt Readings from Last 3 Encounters:  01/24/20 109.3 kg  01/12/20 99.6 kg  07/02/19 811.9 kg   Metabolic Alkalosis  Chronic Hypercarbic Respiratory Failure: this morning with severe alkalosis - repeat VBG was improved and looked more like his previous blood gases.  ? If this was erroneous? Will have pulmonology c/s and give recs - holding lasix, gave dose of diamox and albumin   Hypernatremia: starting to get dehydrated - hold lasix  Hypotension secondary to Septic Shock vs Overdiuresis  Bilateral Lower Lobe Pneumonia:  Continue cefepime (3/7 - present), flagyl (3/8 - present) - plan for 7 days (adding abx as noted above) Blood cx NGTD, urine cx negative I/O, daily weights.  IS, flutter valve. SLP eval -> recommending mechanical soft  Hypokalemia  Hypophosphatemia: replace and follow  HTN: BP meds on hold  HLD:  continue lipitor  Atrial Fibrillation: continue eliquis.  Continue diltiazem and prn metop.  Bipolar Disorder: continue depakote  ? cognitive deficit: Dwayne Bridges&Ox3, but niece suspects he may have undiagnosed autism.  Delirium precautions. Of note, he does not complain and  tends to answer questions positively (has never complained for me).  DVT prophylaxis: eliquis  Code Status: DNR Family Communication: niece 3/15 Disposition Plan:  . Patient came from: SNF            . Anticipated d/c place: SNF . Barriers to d/c OR conditions which need to be met to effect Dwayne Bridges safe d/c: pending improvement in oxygenation, blood pressure  Consultants:   Critical Care  Procedures:  Echo IMPRESSIONS    1. Left ventricular ejection fraction, by estimation, is 60 to 65%. The  left ventricle has normal function. The left ventricle has no regional  wall motion abnormalities.  2. Right ventricular systolic function is normal. The right ventricular  size is normal. There is mildly elevated pulmonary artery systolic  pressure.  3. The mitral valve is normal in structure. Mild mitral valve  regurgitation. No evidence of mitral stenosis.  4. The aortic valve is normal in structure. Aortic valve regurgitation is  not visualized. No aortic stenosis is present.  5. The inferior vena cava is normal in size with greater than 50%  respiratory variability, suggesting right atrial pressure of 3 mmHg.   Antimicrobials: Anti-infectives (From admission, onward)   Start     Dose/Rate Route Frequency Ordered Stop   01/25/20 1800  Ampicillin-Sulbactam (UNASYN) 3 g in sodium chloride 0.9 % 100 mL IVPB  Status:  Discontinued     3 g 200 mL/hr over 30 Minutes Intravenous Every 6 hours 01/25/20 1732 01/26/20 1033   01/18/20 1400  ceFEPIme (MAXIPIME) 2 g in sodium chloride 0.9 % 100 mL IVPB     2 g 200 mL/hr over 30 Minutes Intravenous Every 8 hours 01/18/20 1012 01/25/20 1230   01/18/20 1015  metroNIDAZOLE (FLAGYL) IVPB 500 mg     500 mg 100 mL/hr over 60 Minutes Intravenous Every 8 hours 01/18/20 1012 01/25/20 1014   01/18/20 0900  ceFEPIme (MAXIPIME) 2 g in sodium chloride 0.9 % 100 mL IVPB  Status:  Discontinued     2 g 200 mL/hr over 30 Minutes Intravenous Every 12 hours  01/18/20 0505 01/18/20 1012   01/24/2020 2130  vancomycin (VANCOCIN) IVPB 1000 mg/200 mL premix  Status:  Discontinued     1,000 mg 200 mL/hr over 60 Minutes Intravenous  Once 02/04/2020 2116 01/16/2020 2128   01/16/2020 2130  ceFEPIme (MAXIPIME) 2 g in sodium chloride 0.9 % 100 mL IVPB     2 g 200 mL/hr over 30 Minutes Intravenous  Once 01/19/2020 2116 01/19/2020 2219   02/01/2020 2130  vancomycin (VANCOREADY) IVPB 2000 mg/400 mL     2,000 mg 200 mL/hr over 120 Minutes Intravenous  Once 01/25/2020 2128 01/18/20 0038     Subjective: More talkative today Dwayne Bridges&Ox3 Does not complain about much in general - generally answers positively to questions Says he likes it better off bipap  Objective: Vitals:   01/26/20 0900 01/26/20 1000 01/26/20 1100 01/26/20 1200  BP: 134/74 120/76 131/71 129/65  Pulse: 99 75 (!) 130 79  Resp: (!) 34 (!) 28 (!) 31 (!) 38  Temp:      TempSrc:      SpO2: (!) 89% 90% 90% 91%  Weight:      Height:  Intake/Output Summary (Last 24 hours) at 01/26/2020 1336 Last data filed at 01/26/2020 1200 Gross per 24 hour  Intake 985.32 ml  Output 2350 ml  Net -1364.68 ml   Filed Weights   01/23/20 0500 01/24/20 0500 01/24/20 1259  Weight: 112.6 kg 109.7 kg 109.3 kg    Examination:  General: No acute distress. Cardiovascular: Heart sounds show Dwayne Bridges regular rate, and rhythm Lungs: Clear to auscultation bilaterally  Abdomen: Soft, nontender, nondistended  Neurological: Alert and oriented 3. Moves all extremities 4 . Cranial nerves II through XII grossly intact. Skin: Warm and dry. No rashes or lesions. Extremities: bilateral lower extremity edema      Data Reviewed: I have personally reviewed following labs and imaging studies  CBC: Recent Labs  Lab 01/22/20 0538 01/23/20 0543 01/24/20 0529 01/25/20 0440 01/26/20 0449  WBC 7.5 8.4 13.6* 10.1 10.4  NEUTROABS 5.5 6.5 11.7* 7.9* 8.2*  HGB 10.7* 10.5* 11.6* 10.3* 10.0*  HCT 34.3* 33.8* 36.8* 33.5* 31.9*  MCV 93.7  96.3 96.1 96.8 96.4  PLT 174 181 208 161 678   Basic Metabolic Panel: Recent Labs  Lab 01/22/20 0538 01/22/20 0538 01/23/20 0543 01/24/20 0529 01/24/20 1542 01/25/20 0440 01/26/20 0449  NA 137   < > 140 141 143 144 146*  K 3.4*   < > 4.4 4.3 3.7 3.4* 3.6  CL 96*   < > 95* 91* 89* 88* 88*  CO2 35*   < > 39* 40* 44* 45* 50*  GLUCOSE 75   < > 77 134* 125* 101* 128*  BUN 11   < > 11 13 13 13 14   CREATININE 0.42*   < > 0.37* 0.40* 0.47* 0.40* 0.38*  CALCIUM 8.2*   < > 8.4* 8.7* 8.3* 8.5* 8.4*  MG 1.6*  --  1.9 1.6*  --  1.8 1.6*  PHOS 2.6  --  2.6 2.9  --  2.1* 2.5   < > = values in this interval not displayed.   GFR: Estimated Creatinine Clearance: 106.5 mL/min (Dwayne Bridges) (by C-G formula based on SCr of 0.38 mg/dL (L)). Liver Function Tests: Recent Labs  Lab 01/22/20 0538 01/23/20 0543 01/24/20 0529 01/25/20 0440 01/26/20 0449  AST 17 14* 16 13* 15  ALT 13 10 12 11 12   ALKPHOS 49 49 58 50 50  BILITOT 0.6 0.6 0.8 0.8 0.9  PROT 5.4* 5.5* 6.2* 6.0* 6.0*  ALBUMIN 2.1* 2.2* 2.4* 2.4* 2.2*   No results for input(s): LIPASE, AMYLASE in the last 168 hours. No results for input(s): AMMONIA in the last 168 hours. Coagulation Profile: No results for input(s): INR, PROTIME in the last 168 hours. Cardiac Enzymes: No results for input(s): CKTOTAL, CKMB, CKMBINDEX, TROPONINI in the last 168 hours. BNP (last 3 results) No results for input(s): PROBNP in the last 8760 hours. HbA1C: No results for input(s): HGBA1C in the last 72 hours. CBG: Recent Labs  Lab 01/24/20 1258  GLUCAP 187*   Lipid Profile: No results for input(s): CHOL, HDL, LDLCALC, TRIG, CHOLHDL, LDLDIRECT in the last 72 hours. Thyroid Function Tests: No results for input(s): TSH, T4TOTAL, FREET4, T3FREE, THYROIDAB in the last 72 hours. Anemia Panel: No results for input(s): VITAMINB12, FOLATE, FERRITIN, TIBC, IRON, RETICCTPCT in the last 72 hours. Sepsis Labs: No results for input(s): PROCALCITON, LATICACIDVEN in the  last 168 hours.  Recent Results (from the past 240 hour(s))  Blood Culture (routine x 2)     Status: None   Collection Time: 01/29/2020  9:31 PM  Specimen: BLOOD  Result Value Ref Range Status   Specimen Description BLOOD LEFT ANTECUBITAL  Final   Special Requests   Final    BOTTLES DRAWN AEROBIC AND ANAEROBIC Blood Culture adequate volume   Culture   Final    NO GROWTH 5 DAYS Performed at [facility], [address] [place], [place] [address]    Report Status 01/22/2020 FINAL  Final  Blood Culture (routine x 2)     Status: None   Collection Time: 01/21/2020  9:36 PM   Specimen: BLOOD  Result Value Ref Range Status   Specimen Description BLOOD LEFT HAND  Final   Special Requests   Final    BOTTLES DRAWN AEROBIC AND ANAEROBIC Blood Culture adequate volume   Culture   Final    NO GROWTH 5 DAYS Performed at [facility], [address] [place], [place] [address]    Report Status 01/22/2020 FINAL  Final  Urine culture     Status: None   Collection Time: 01/16/2020  9:54 PM   Specimen: In/Out Cath Urine  Result Value Ref Range Status   Specimen Description   Final    IN/OUT CATH URINE Performed at [facility], [address], [place], [place] [address]    Special Requests   Final    NONE Performed at [facility], [address] [place], [place] [address]    Culture   Final    NO GROWTH Performed at [facility], [place] [address], [place], [place] [address]    Report Status 01/19/2020 FINAL  Final  Respiratory Panel by RT PCR (Flu Dwayne Bridges&B, Covid) - Nasopharyngeal Swab     Status: None   Collection Time: 02/08/2020  9:54 PM   Specimen: Nasopharyngeal Swab  Result Value Ref Range Status   SARS Coronavirus 2 by RT PCR NEGATIVE NEGATIVE Final    Comment: (NOTE) SARS-CoV-2 target nucleic acids are NOT DETECTED. The SARS-CoV-2 RNA is generally detectable in upper respiratoy specimens during the acute phase of infection. The  lowest concentration of SARS-CoV-2 viral copies this assay can detect is 131 copies/mL. Dwayne Bridges negative result does not preclude SARS-Cov-2 infection and should not be used as the sole basis for treatment or other patient management decisions. Dwayne Bridges negative result may occur with  improper specimen collection/handling, submission of specimen other than nasopharyngeal swab, presence of viral mutation(s) within the areas targeted by this assay, and inadequate number of viral copies (<131 copies/mL). Dwayne Bridges negative result must be combined with clinical observations, patient history, and epidemiological information. The expected result is Negative. Fact Sheet for Patients:  Dwayne Bridges Fact Sheet for Healthcare Providers:  Dwayne Bridges This test is not yet ap proved or cleared by the [place] FDA and  has been authorized for detection and/or diagnosis of SARS-CoV-2 by FDA under an Emergency Use Authorization (EUA). This EUA will remain  in effect (meaning this test can be used) for the duration of the COVID-19 declaration under Section 564(b)(1) of the Act, 21 U.S.C. section 360bbb-3(b)(1), unless the authorization is terminated or revoked sooner.    Influenza Dwayne Bridges by PCR NEGATIVE NEGATIVE Final   Influenza B by PCR NEGATIVE NEGATIVE Final    Comment: (NOTE) The Xpert Xpress SARS-CoV-2/FLU/RSV assay is intended as an aid in  the diagnosis of influenza from Nasopharyngeal swab specimens and  should not be used as Dwayne Bridges sole basis for treatment. Nasal washings and  aspirates are unacceptable for Xpert Xpress SARS-CoV-2/FLU/RSV  testing. Fact Sheet for  Patients: Dwayne Bridges Fact Sheet for Healthcare Providers: Dwayne Bridges This test is not yet approved or cleared by the [place] FDA and  has been authorized for detection and/or diagnosis of SARS-CoV-2 by  FDA under an Emergency  Use Authorization (EUA). This EUA will remain  in effect (meaning this test can be used) for the duration of the  Covid-19 declaration under Section 564(b)(1) of the Act, 21  U.S.C. section 360bbb-3(b)(1), unless the authorization is  terminated or revoked. Performed at [facility], [address] [place], [place] [address]   MRSA PCR Screening     Status: None   Collection Time: 01/18/20 12:57 AM   Specimen: Nasopharyngeal  Result Value Ref Range Status   MRSA by PCR NEGATIVE NEGATIVE Final    Comment:        The GeneXpert MRSA Assay (FDA approved for NASAL specimens only), is one component of Dwayne Bridges comprehensive MRSA colonization surveillance program. It is not intended to diagnose MRSA infection nor to guide or monitor treatment for MRSA infections. Performed at [facility], [address] [place], [place] [address]          Radiology Studies: DG Chest [place] 1 View  Result Date: 01/26/2020 CLINICAL DATA:  Hypoxic, hypercapnic respiratory failure with bilateral pneumonia EXAM: PORTABLE CHEST 1 VIEW COMPARISON:  Radiograph 01/25/2020 FINDINGS: Diffuse bilateral heterogeneous interstitial and airspace opacities more pronounced in the right mid to lower lung. Overall extent and distribution appears slightly increased from comparison exam 1 day prior. Bilateral layering pleural effusions are seen. Cardiomediastinal contours are stable. No pneumothorax. No acute osseous or soft tissue abnormality. IMPRESSION: 1. Slight interval worsening of diffuse bilateral interstitial and airspace opacities which may represent pulmonary edema, ARDS, multifocal pneumonia, or some combination there of. 2. Bilateral layering pleural effusions. Electronically Signed   By: Dwayne Le M.D.   On: 01/26/2020 06:54   DG Chest [place] 1 View  Result Date: 01/25/2020 CLINICAL DATA:  Hypoxia. EXAM: PORTABLE CHEST 1 VIEW COMPARISON:  01/24/2020 FINDINGS: Cardiomediastinal contours remain  enlarged. Increased interstitial markings present bilaterally with hazy opacification in both the right and left hemithorax. Right hemidiaphragm is more obscured than on the previous exam. Visualized skeletal structures are unremarkable. IMPRESSION: Findings could be seen in the setting of CHF or atypical infection. Increasing opacification in the right hemithorax may be due to increasing volume of pleural fluid and associated atelectatic change versus is developing infection/consolidation. Electronically Signed   By: Dwayne Bills M.D.   On: 01/25/2020 08:56        Scheduled Meds: . apixaban  5 mg Oral BID  . atorvastatin  20 mg Oral QHS  . Chlorhexidine Gluconate Cloth  6 each Topical Daily  . diltiazem  60 mg Oral Q6H  . divalproex  375 mg Oral BID  . feeding supplement (ENSURE ENLIVE)  237 mL Oral BID BM  . phosphorus  500 mg Oral QID  . polyvinyl alcohol  1 drop Both Eyes QID  . senna-docusate  1 tablet Oral BID   Continuous Infusions: . sodium chloride 10 mL/hr at 01/24/20 1056     LOS: 8 days    Time spent: over 30 min    Dwayne Helper, MD [facility]   To contact the attending provider between 7A-7P or the covering provider during after hours 7P-7A, please log into the web site [email or web address] and access using universal [place] password for that web site. If you do not have the password, please call the hospital  operator.  01/26/2020, 1:36 PM

## 2020-01-26 NOTE — Consult Note (Signed)
Name: Dwayne Bridges MRN: [id number] DOB: 12-05-1950    ADMISSION DATE:  02/01/2020 CONSULTATION DATE: 01/18/2020  REFERRING MD : Webb Silversmith, NP    BRIEF PATIENT DESCRIPTION:  69 yo male admitted with hypotension likely secondary to hypovolemic shock secondary to dehydration/overdiuresis and possible sepsis with possible bilateral lower lobe pneumonia vs. partial compressive atelectasis   SIGNIFICANT EVENTS/STUDIES:  03/7: Pt admitted to the stepdown unit with hypotension 03/7: CTA Chest revealed no CT evidence of central pulmonary artery embolus. Moderate cardiomegaly with findings of right heart dysfunction. Correlation with echocardiogram recommended. Small bilateral pleural effusions with bilateral lower lobe partial compressive atelectasis versus pneumonia. Clinical correlation is recommended. Aortic Atherosclerosis  03/8: Pts status changed to ICU due to continue hypotension despite aggressive fluid resuscitation   Off vasopressor, able to speak with appropriate answers     Review of Systems: 10 point system negative   Other:  All other systems negative  VITAL SIGNS: Temp:  [97.8 F (36.6 C)-98.3 F (36.8 C)] 97.8 F (36.6 C) (03/16 1519) Pulse Rate:  [37-130] 79 (03/16 1700) Resp:  [17-39] 35 (03/16 1700) BP: (107-134)/(63-92) 117/79 (03/16 1700) SpO2:  [86 %-99 %] 86 % (03/16 1700) Weight:  [110.4 kg] 110.4 kg (03/16 1432)  Physical Examination:   General Appearance: No distress  Neuro:without focal findings,   HEENT: PERRLA, EOM intact.   Pulmonary: +crackles bilaterally  CardiovascularNormal S1,S2.  No m/r/g.   Abdomen: Benign, Soft, non-tender. Renal:  No costovertebral tenderness  GU:  Not performed at this time. Endoc: No evident thyromegaly Skin:   warm, no rashes, no ecchymosis  Extremities:2+edema  PSYCHIATRIC: Mood, affect within normal limits.      Recent Labs  Lab 01/24/20 1542 01/25/20 0440 01/26/20 0449  NA 143 144 146*  K 3.7  3.4* 3.6  CL 89* 88* 88*  CO2 44* 45* 50*  BUN 13 13 14   CREATININE 0.47* 0.40* 0.38*  GLUCOSE 125* 101* 128*   Recent Labs  Lab 01/24/20 0529 01/25/20 0440 01/26/20 0449  HGB 11.6* 10.3* 10.0*  HCT 36.8* 33.5* 31.9*  WBC 13.6* 10.1 10.4  PLT 208 161 162   DG Chest Port 1 View  Result Date: 01/26/2020 CLINICAL DATA:  Hypoxic, hypercapnic respiratory failure with bilateral pneumonia EXAM: PORTABLE CHEST 1 VIEW COMPARISON:  Radiograph 01/25/2020 FINDINGS: Diffuse bilateral heterogeneous interstitial and airspace opacities more pronounced in the right mid to lower lung. Overall extent and distribution appears slightly increased from comparison exam 1 day prior. Bilateral layering pleural effusions are seen. Cardiomediastinal contours are stable. No pneumothorax. No acute osseous or soft tissue abnormality. IMPRESSION: 1. Slight interval worsening of diffuse bilateral interstitial and airspace opacities which may represent pulmonary edema, ARDS, multifocal pneumonia, or some combination there of. 2. Bilateral layering pleural effusions. Electronically Signed   By: 01/27/2020 M.D.   On: 01/26/2020 06:54   DG Chest Port 1 View  Result Date: 01/25/2020 CLINICAL DATA:  Hypoxia. EXAM: PORTABLE CHEST 1 VIEW COMPARISON:  01/24/2020 FINDINGS: Cardiomediastinal contours remain enlarged. Increased interstitial markings present bilaterally with hazy opacification in both the right and left hemithorax. Right hemidiaphragm is more obscured than on the previous exam. Visualized skeletal structures are unremarkable. IMPRESSION: Findings could be seen in the setting of CHF or atypical infection. Increasing opacification in the right hemithorax may be due to increasing volume of pleural fluid and associated atelectatic change versus is developing infection/consolidation. Electronically Signed   By: 01/26/2020 M.D.   On: 01/25/2020 08:56  ASSESSMENT / PLAN:   Severe metabolic alkalosis -due to  chronic CO2 retention with concomitant volume contraction post diuresis -repeat VBG with normal ph today despite relatively elevated CO2 - will continue BIPAP QHS and 1/2 of daytime while diuresing -will continue diuresis for pulmonary edema note on CT chest   Chronic diastolic CHF  - continue diuresis - will start demadex 20 po bid  - monitor mentation   -creatinine is ok  - BIPAP overnight please    Moderate protein calorie malnutrition   - albumin <2.4  - bitemporal wasting and muscular weakness  -RD consult   OSA     Use BIPAP for now RT for settings  Atrial fibrillation  - on cardizem -on eliquis   Critical care provider statement:    Critical care time (minutes):  109   Critical care time was exclusive of:  Separately billable procedures and  treating other patients   Critical care was necessary to treat or prevent imminent or  life-threatening deterioration of the following conditions:  metabilic alkalosisk, hypotension, confusion , moderate protein calorie malnutrionin, multiple comorbid contieions   Critical care was time spent personally by me on the following  activities:  Development of treatment plan with patient or surrogate,  discussions with consultants, evaluation of patient's response to  treatment, examination of patient, obtaining history from patient or  surrogate, ordering and performing treatments and interventions, ordering  and review of laboratory studies and re-evaluation of patient's condition   I assumed direction of critical care for this patient from another  provider in my specialty: no       Ottie Glazier, M.D.  [facility] [place]

## 2020-01-27 ENCOUNTER — Inpatient Hospital Stay: Payer: No Typology Code available for payment source

## 2020-01-27 LAB — CBC WITH DIFFERENTIAL/PLATELET
Abs Immature Granulocytes: 0.2 10*3/uL — ABNORMAL HIGH (ref 0.00–0.07)
Basophils Absolute: 0.1 10*3/uL (ref 0.0–0.1)
Basophils Relative: 1 %
Eosinophils Absolute: 0.1 10*3/uL (ref 0.0–0.5)
Eosinophils Relative: 1 %
HCT: 32.7 % — ABNORMAL LOW (ref 39.0–52.0)
Hemoglobin: 10.2 g/dL — ABNORMAL LOW (ref 13.0–17.0)
Immature Granulocytes: 2 %
Lymphocytes Relative: 12 %
Lymphs Abs: 1.3 10*3/uL (ref 0.7–4.0)
MCH: 29.8 pg (ref 26.0–34.0)
MCHC: 31.2 g/dL (ref 30.0–36.0)
MCV: 95.6 fL (ref 80.0–100.0)
Monocytes Absolute: 0.8 10*3/uL (ref 0.1–1.0)
Monocytes Relative: 7 %
Neutro Abs: 9.1 10*3/uL — ABNORMAL HIGH (ref 1.7–7.7)
Neutrophils Relative %: 77 %
Platelets: 169 10*3/uL (ref 150–400)
RBC: 3.42 MIL/uL — ABNORMAL LOW (ref 4.22–5.81)
RDW: 15.3 % (ref 11.5–15.5)
WBC: 11.5 10*3/uL — ABNORMAL HIGH (ref 4.0–10.5)
nRBC: 0 % (ref 0.0–0.2)

## 2020-01-27 LAB — MAGNESIUM: Magnesium: 1.8 mg/dL (ref 1.7–2.4)

## 2020-01-27 LAB — COMPREHENSIVE METABOLIC PANEL
ALT: 11 U/L (ref 0–44)
AST: 15 U/L (ref 15–41)
Albumin: 2.7 g/dL — ABNORMAL LOW (ref 3.5–5.0)
Alkaline Phosphatase: 56 U/L (ref 38–126)
Anion gap: 12 (ref 5–15)
BUN: 14 mg/dL (ref 8–23)
CO2: 46 mmol/L — ABNORMAL HIGH (ref 22–32)
Calcium: 8.6 mg/dL — ABNORMAL LOW (ref 8.9–10.3)
Chloride: 85 mmol/L — ABNORMAL LOW (ref 98–111)
Creatinine, Ser: 0.42 mg/dL — ABNORMAL LOW (ref 0.61–1.24)
GFR calc Af Amer: >60 mL/min (ref >60)
GFR calc non Af Amer: >60 mL/min (ref >60)
Glucose, Bld: 132 mg/dL — ABNORMAL HIGH (ref 70–99)
Potassium: 3 mmol/L — ABNORMAL LOW (ref 3.5–5.1)
Sodium: 143 mmol/L (ref 135–145)
Total Bilirubin: 1 mg/dL (ref 0.3–1.2)
Total Protein: 6.8 g/dL (ref 6.5–8.1)

## 2020-01-27 LAB — PHOSPHORUS: Phosphorus: 2.9 mg/dL (ref 2.5–4.6)

## 2020-01-27 MED ORDER — PANTOPRAZOLE SODIUM 40 MG IV SOLR
40.0000 mg | Freq: Every day | INTRAVENOUS | Status: DC
  Administered 2020-01-27 – 2020-01-29 (×3): 40 mg via INTRAVENOUS
  Filled 2020-01-27 (×3): qty 40

## 2020-01-27 MED ORDER — POTASSIUM CHLORIDE 10 MEQ/100ML IV SOLN
10.0000 meq | Freq: Once | INTRAVENOUS | Status: AC
  Administered 2020-01-27: 10 meq via INTRAVENOUS
  Filled 2020-01-27: qty 100

## 2020-01-27 MED ORDER — POTASSIUM CHLORIDE CRYS ER 20 MEQ PO TBCR
40.0000 meq | EXTENDED_RELEASE_TABLET | Freq: Four times a day (QID) | ORAL | Status: DC
  Administered 2020-01-27: 40 meq via ORAL
  Filled 2020-01-27: qty 2

## 2020-01-27 NOTE — Progress Notes (Signed)
Increased I and E Pap to achieve at least a 600 tidal volume, per md request.

## 2020-01-27 NOTE — Progress Notes (Signed)
Pharmacy Electrolyte Monitoring Consult:  Pharmacy consulted to assist in monitoring and replacing electrolytes in this 69 y.o. male admitted on 01/20/2020 with hypoxic and hypercapnic respiratory failure from bilateral pneumonia.   Labs:  Sodium (mmol/L)  Date Value  01/27/2020 143  12/05/2013 142   Potassium (mmol/L)  Date Value  01/27/2020 3.0 (L)  12/05/2013 3.1 (L)   Magnesium (mg/dL)  Date Value  78/29/5621 1.8  12/05/2013 1.8   Phosphorus (mg/dL)  Date Value  30/86/5784 2.9   Calcium (mg/dL)  Date Value  69/62/9528 8.6 (L)   Calcium, Total (mg/dL)  Date Value  41/32/4401 8.4 (L)   Albumin (g/dL)  Date Value  02/72/5366 2.7 (L)  12/01/2013 2.7 (L)    Assessment/Plan: Patient started on torsemide 20 mg BID. Decrease in potassium. Will order potassium 40 mEq PO x 2 doses to maintain potassium while on diuretic.  Will replace for goal potassium ~ 4, magnesium ~ 2, and phosphorus ~ 3.   Follow up electrolytes with morning labs.  Pricilla Riffle, PharmD 01/27/2020 3:11 PM

## 2020-01-27 NOTE — Progress Notes (Signed)
PROGRESS NOTE    Dwayne Bridges  IYM:[id number] DOB: July 28, 1951 DOA: 01/14/2020 PCP: Patient, [name] Per   Brief Narrative:  69 yo with hx of HTN, HLD, GERD, anxiety, dHF, bipolar disorder, COVID infectioon in May 2020, atrial fibrillation on eliquis who presented with hypotension and decreased PO intake.  He was recently discharged on 3/2, he'd been treated for HF exacerbation at that admission and was diuresed.  He represented with hypotension and concern for possible sepsis from pneumonia.  He was admitted to the ICU due to persistent hypotension despite aggressive IVF.  He's been transferred back to the hospitalist service on 3/10.  He's been treated with IV antibiotics and gradually improved, though he had worsening peripheral edema.  He was restarted on diuresis.  He'd previously been discharged with nightly bipap and this was restarted as well.  He currently has severe metabolic acidosis which is complicating diuresis.  Bipap has been discontinued due to concerns for aspiration.  Assessment & Plan:   Active Problems:   HTN (hypertension)   BPH (benign prostatic hyperplasia)   HLD (hyperlipidemia)   Atrial fibrillation, chronic (HCC)   Bipolar disorder, unspecified (HCC)   AKI (acute kidney injury) ([place])   Dehydration   Hypotension   Hypokalemia   Abdominal distension   Hyponatremia   Septic shock (HCC)   Hypoxia  Acute Hypoxic Respiratory Failure 2/2 Pneumonia and Heart Failure: Currently requiring 4 L [place] S/p broad spectrum abx x7 days Lasix currently on hold due to metabolic alkalosis I discontinued his bipap last night with concern for aspiration on bipap (seemed hoarser, with wet voice/breath sounds after we'd started bipap)  Due to worsening CXR findings, will continue abx, narrow to unasyn and follow (? Aspiration) SLP eval pending -> mechanical soft with thin liquid (see note) CXR 3/14 am with improved edema CXR 3/15 with findings concerning for HF vs atypical infection  - increasing opacification in R hemithorax 2/2 increasing volume of pleural fluid and atelectatic change vs developing infection/consolidation CXR 3/16 with slight interval worsening of bilateral interstitial and airspace opacities concerning for edema (vs ARDS or pneumonia) Pulmonology c/s, appreciate recs  HFpEF Exacerbation: echo from 2/26 with EF 60-65%, indeterminate LV diastolic parameters (see report).  Holding diuresis at this time due to hypotension at presentation. Repeat echo with findings of R heart dysfunction on CT scan - repeat echo with mildly elevate pulmonary artery systolic pressure Follow I/O, daily weights (net positive 1.3 L and weight up to 109.3 kg on 3/14 --- he was 99.6 kg on 3/2 day of discharge) Lasix currently on hold - penile edema, LE edema, elevated BNP, will continue to monitor  Wt Readings from Last 3 Encounters:  01/27/20 109.9 kg  01/12/20 99.6 kg  07/02/19 768.0 kg   Metabolic Alkalosis  Chronic Hypercarbic Respiratory Failure: this morning with severe alkalosis - repeat VBG was improved and looked more like his previous blood gases.  ? If this was erroneous? Will have pulmonology c/s and give recs - holding lasix, gave dose of diamox and albumin   Hypernatremia: starting to get dehydrated - hold lasix  Hypotension secondary to Septic Shock vs Overdiuresis  Bilateral Lower Lobe Pneumonia:  Continue cefepime (3/7 - present), flagyl (3/8 - present) - plan for 7 days (adding abx as noted above) Blood cx NGTD, urine cx negative I/O, daily weights.  IS, flutter valve. SLP eval -> recommending mechanical soft  Hypokalemia  Hypophosphatemia: replace and follow  HTN: BP meds on hold  HLD:  continue lipitor  Atrial Fibrillation: continue eliquis.  Continue diltiazem and prn metop.  Bipolar Disorder: continue depakote  ? cognitive deficit: A&Ox3, but niece suspects he may have undiagnosed autism.  Delirium precautions. Of note, he does not complain and  tends to answer questions positively (has never complained for me).  DVT prophylaxis: eliquis  Code Status: DNR Family Communication: niece 3/15 Disposition Plan:  . Patient came from: SNF            . Anticipated d/c place: SNF . Barriers to d/c OR conditions which need to be met to effect a safe d/c: pending improvement in oxygenation, blood pressure  Consultants:   Critical Care  Procedures:  Echo IMPRESSIONS    1. Left ventricular ejection fraction, by estimation, is 60 to 65%. The  left ventricle has normal function. The left ventricle has no regional  wall motion abnormalities.  2. Right ventricular systolic function is normal. The right ventricular  size is normal. There is mildly elevated pulmonary artery systolic  pressure.  3. The mitral valve is normal in structure. Mild mitral valve  regurgitation. No evidence of mitral stenosis.  4. The aortic valve is normal in structure. Aortic valve regurgitation is  not visualized. No aortic stenosis is present.  5. The inferior vena cava is normal in size with greater than 50%  respiratory variability, suggesting right atrial pressure of 3 mmHg.   Antimicrobials: Anti-infectives (From admission, onward)   Start     Dose/Rate Route Frequency Ordered Stop   01/25/20 1800  Ampicillin-Sulbactam (UNASYN) 3 g in sodium chloride 0.9 % 100 mL IVPB  Status:  Discontinued     3 g 200 mL/hr over 30 Minutes Intravenous Every 6 hours 01/25/20 1732 01/26/20 1033   01/18/20 1400  ceFEPIme (MAXIPIME) 2 g in sodium chloride 0.9 % 100 mL IVPB     2 g 200 mL/hr over 30 Minutes Intravenous Every 8 hours 01/18/20 1012 01/25/20 1230   01/18/20 1015  metroNIDAZOLE (FLAGYL) IVPB 500 mg     500 mg 100 mL/hr over 60 Minutes Intravenous Every 8 hours 01/18/20 1012 01/25/20 1014   01/18/20 0900  ceFEPIme (MAXIPIME) 2 g in sodium chloride 0.9 % 100 mL IVPB  Status:  Discontinued     2 g 200 mL/hr over 30 Minutes Intravenous Every 12 hours  01/18/20 0505 01/18/20 1012   01/15/2020 2130  vancomycin (VANCOCIN) IVPB 1000 mg/200 mL premix  Status:  Discontinued     1,000 mg 200 mL/hr over 60 Minutes Intravenous  Once 02/04/2020 2116 02/03/2020 2128   01/22/2020 2130  ceFEPIme (MAXIPIME) 2 g in sodium chloride 0.9 % 100 mL IVPB     2 g 200 mL/hr over 30 Minutes Intravenous  Once 01/23/2020 2116 01/26/2020 2219   01/20/2020 2130  vancomycin (VANCOREADY) IVPB 2000 mg/400 mL     2,000 mg 200 mL/hr over 120 Minutes Intravenous  Once 02/09/2020 2128 01/18/20 0038     Subjective: More talkative today A&Ox3 Does not complain about much in general - generally answers positively to questions Says he likes it better off bipap  01/27/20: Pt is alert,awake and cooperative o2 sats are in 80's and d/w RT to start Bipap or high flow  , pt is also been hypotensive and has guarded prognosis due to need for continuous ventilatory  Support with bipap . Pt does not report any other complaints.   Objective: Vitals:   01/27/20 0400 01/27/20 0500 01/27/20 0600 01/27/20 0621  BP: 130/77 114/70  136/82 136/82  Pulse: 71 76 79   Resp: (!) 34     Temp: 98.7 F (37.1 C)     TempSrc: Axillary     SpO2: 95% 95% 96%   Weight:  109.9 kg    Height:        Intake/Output Summary (Last 24 hours) at 01/27/2020 0747 Last data filed at 01/27/2020 0500 Gross per 24 hour  Intake 789.32 ml  Output 3450 ml  Net -2660.68 ml   Filed Weights   01/24/20 1259 01/26/20 1432 01/27/20 0500  Weight: 109.3 kg 110.4 kg 109.9 kg   Examination: Blood pressure (!) 85/59, pulse (!) 59, temperature 97.7 F (36.5 C), temperature source Axillary, resp. rate (!) 23, height 5' 11"  (1.803 m), weight 109.9 kg, SpO2 98 %. General: No acute distress. Cardiovascular: Heart sounds show a regular rate, and rhythm Lungs: bl wheezing on inspiration and expiration.  Abdomen: Soft, nontender, nondistended  Neurological: Alert and oriented 3. Moves all extremities 4 . Cranial nerves II through  XII grossly intact. Skin: Warm and dry. No rashes or lesions. Extremities: Bilateral lower extremity edema.  Data Reviewed:  CBC: Recent Labs  Lab 01/23/20 0543 01/24/20 0529 01/25/20 0440 01/26/20 0449 01/27/20 0628  WBC 8.4 13.6* 10.1 10.4 11.5*  NEUTROABS 6.5 11.7* 7.9* 8.2* 9.1*  HGB 10.5* 11.6* 10.3* 10.0* 10.2*  HCT 33.8* 36.8* 33.5* 31.9* 32.7*  MCV 96.3 96.1 96.8 96.4 95.6  PLT 181 208 161 162 158   Basic Metabolic Panel: Recent Labs  Lab 01/23/20 0543 01/23/20 0543 01/24/20 0529 01/24/20 1542 01/25/20 0440 01/26/20 0449 01/27/20 0628  NA 140   < > 141 143 144 146* 143  K 4.4   < > 4.3 3.7 3.4* 3.6 3.0*  CL 95*   < > 91* 89* 88* 88* 85*  CO2 39*   < > 40* 44* 45* 50* 46*  GLUCOSE 77   < > 134* 125* 101* 128* 132*  BUN 11   < > 13 13 13 14 14   CREATININE 0.37*   < > 0.40* 0.47* 0.40* 0.38* 0.42*  CALCIUM 8.4*   < > 8.7* 8.3* 8.5* 8.4* 8.6*  MG 1.9  --  1.6*  --  1.8 1.6* 1.8  PHOS 2.6  --  2.9  --  2.1* 2.5 2.9   < > = values in this interval not displayed.   GFR: Estimated Creatinine Clearance: 106.7 mL/min (A) (by C-G formula based on SCr of 0.42 mg/dL (L)). Liver Function Tests: Recent Labs  Lab 01/23/20 0543 01/24/20 0529 01/25/20 0440 01/26/20 0449 01/27/20 0628  AST 14* 16 13* 15 15  ALT 10 12 11 12 11   ALKPHOS 49 58 50 50 56  BILITOT 0.6 0.8 0.8 0.9 1.0  PROT 5.5* 6.2* 6.0* 6.0* 6.8  ALBUMIN 2.2* 2.4* 2.4* 2.2* 2.7*   CBG: Recent Labs  Lab 01/24/20 1258  GLUCAP 187*     Recent Results (from the past 240 hour(s))  Blood Culture (routine x 2)     Status: None   Collection Time: 01/15/2020  9:31 PM   Specimen: BLOOD  Result Value Ref Range Status   Specimen Description BLOOD LEFT ANTECUBITAL  Final   Special Requests   Final    BOTTLES DRAWN AEROBIC AND ANAEROBIC Blood Culture adequate volume   Culture   Final    NO GROWTH 5 DAYS Performed at [facility], [address], [place], [place] [address]    Report Status  01/22/2020  FINAL  Final  Blood Culture (routine x 2)     Status: None   Collection Time: 01/22/2020  9:36 PM   Specimen: BLOOD  Result Value Ref Range Status   Specimen Description BLOOD LEFT HAND  Final   Special Requests   Final    BOTTLES DRAWN AEROBIC AND ANAEROBIC Blood Culture adequate volume   Culture   Final    NO GROWTH 5 DAYS Performed at [facility], [address] [place], [place] [address]    Report Status 01/22/2020 FINAL  Final  Urine culture     Status: None   Collection Time: 01/18/2020  9:54 PM   Specimen: In/Out Cath Urine  Result Value Ref Range Status   Specimen Description   Final    IN/OUT CATH URINE Performed at [facility], [address], [place], [place] [address]    Special Requests   Final    NONE Performed at [facility], [address], [place], [place] [address]    Culture   Final    NO GROWTH Performed at [facility], [place] [address], [place], [place] [address]    Report Status 01/19/2020 FINAL  Final  Respiratory Panel by RT PCR (Flu A&B, Covid) - Nasopharyngeal Swab     Status: None   Collection Time: 02/05/2020  9:54 PM   Specimen: Nasopharyngeal Swab  Result Value Ref Range Status   SARS Coronavirus 2 by RT PCR NEGATIVE NEGATIVE Final    Comment: (NOTE) SARS-CoV-2 target nucleic acids are NOT DETECTED. The SARS-CoV-2 RNA is generally detectable in upper respiratoy specimens during the acute phase of infection. The lowest concentration of SARS-CoV-2 viral copies this assay can detect is 131 copies/mL. A negative result does not preclude SARS-Cov-2 infection and should not be used as the sole basis for treatment or other patient management decisions. A negative result may occur with  improper specimen collection/handling, submission of specimen other than nasopharyngeal swab, presence of viral mutation(s) within the areas targeted by this assay, and inadequate number of viral copies (<131 copies/mL). A  negative result must be combined with clinical observations, patient history, and epidemiological information. The expected result is Negative. Fact Sheet for Patients:  PinkCheek.be Fact Sheet for Healthcare Providers:  GravelBags.it This test is not yet ap proved or cleared by the [place] FDA and  has been authorized for detection and/or diagnosis of SARS-CoV-2 by FDA under an Emergency Use Authorization (EUA). This EUA will remain  in effect (meaning this test can be used) for the duration of the COVID-19 declaration under Section 564(b)(1) of the Act, 21 U.S.C. section 360bbb-3(b)(1), unless the authorization is terminated or revoked sooner.    Influenza A by PCR NEGATIVE NEGATIVE Final   Influenza B by PCR NEGATIVE NEGATIVE Final    Comment: (NOTE) The Xpert Xpress SARS-CoV-2/FLU/RSV assay is intended as an aid in  the diagnosis of influenza from Nasopharyngeal swab specimens and  should not be used as a sole basis for treatment. Nasal washings and  aspirates are unacceptable for Xpert Xpress SARS-CoV-2/FLU/RSV  testing. Fact Sheet for Patients: PinkCheek.be Fact Sheet for Healthcare Providers: GravelBags.it This test is not yet approved or cleared by the [place] FDA and  has been authorized for detection and/or diagnosis of SARS-CoV-2 by  FDA under an Emergency Use Authorization (EUA). This EUA will remain  in effect (meaning this test can be used) for the duration of the  Covid-19 declaration under Section 564(b)(1) of the  Act, 21  U.S.C. section 360bbb-3(b)(1), unless the authorization is  terminated or revoked. Performed at [facility], [address] [place], [place] [address]   MRSA PCR Screening     Status: None   Collection Time: 01/18/20 12:57 AM   Specimen: Nasopharyngeal  Result Value Ref Range Status   MRSA by PCR NEGATIVE  NEGATIVE Final    Comment:        The GeneXpert MRSA Assay (FDA approved for NASAL specimens only), is one component of a comprehensive MRSA colonization surveillance program. It is not intended to diagnose MRSA infection nor to guide or monitor treatment for MRSA infections. Performed at [facility], [address] [place], [place] [address]      Radiology Studies: DG Chest [place] 1 View  Result Date: 01/27/2020 CLINICAL DATA:  Aspiration into airway EXAM: PORTABLE CHEST 1 VIEW COMPARISON:  Yesterday FINDINGS: Extensive pulmonary opacity on the right more than left with small pleural effusions confirmed by recent CT. Cardiomegaly and vascular pedicle widening. No visible pneumothorax. IMPRESSION: Extensive airspace disease and right more than left pleural effusion. Opacity is stable or mildly increased from yesterday. Electronically Signed   By: Monte Fantasia M.D.   On: 01/27/2020 07:25   DG Chest Port 1 View  Result Date: 01/26/2020 CLINICAL DATA:  Hypoxic, hypercapnic respiratory failure with bilateral pneumonia EXAM: PORTABLE CHEST 1 VIEW COMPARISON:  Radiograph 01/25/2020 FINDINGS: Diffuse bilateral heterogeneous interstitial and airspace opacities more pronounced in the right mid to lower lung. Overall extent and distribution appears slightly increased from comparison exam 1 day prior. Bilateral layering pleural effusions are seen. Cardiomediastinal contours are stable. No pneumothorax. No acute osseous or soft tissue abnormality. IMPRESSION: 1. Slight interval worsening of diffuse bilateral interstitial and airspace opacities which may represent pulmonary edema, ARDS, multifocal pneumonia, or some combination there of. 2. Bilateral layering pleural effusions. Electronically Signed   By: Lovena Le M.D.   On: 01/26/2020 06:54   DG Chest Port 1 View  Result Date: 01/25/2020 CLINICAL DATA:  Hypoxia. EXAM: PORTABLE CHEST 1 VIEW COMPARISON:  01/24/2020 FINDINGS:  Cardiomediastinal contours remain enlarged. Increased interstitial markings present bilaterally with hazy opacification in both the right and left hemithorax. Right hemidiaphragm is more obscured than on the previous exam. Visualized skeletal structures are unremarkable. IMPRESSION: Findings could be seen in the setting of CHF or atypical infection. Increasing opacification in the right hemithorax may be due to increasing volume of pleural fluid and associated atelectatic change versus is developing infection/consolidation. Electronically Signed   By: Zetta Bills M.D.   On: 01/25/2020 08:56    Scheduled Meds: . apixaban  5 mg Oral BID  . atorvastatin  20 mg Oral QHS  . Chlorhexidine Gluconate Cloth  6 each Topical Daily  . diltiazem  60 mg Oral Q6H  . divalproex  375 mg Oral BID  . feeding supplement (ENSURE ENLIVE)  237 mL Oral BID BM  . polyvinyl alcohol  1 drop Both Eyes QID  . senna-docusate  1 tablet Oral BID  . torsemide  20 mg Oral BID   Continuous Infusions: . sodium chloride 10 mL/hr at 01/24/20 1056  . albumin human 12.5 g (01/26/20 1816)     LOS: 9 days    Time spent: over 20 min Para Skeans, MD [facility] To contact the attending provider between 7A-7P or the covering provider during after hours 7P-7A, please log into the web site [email or web address] and access using universal [place] password for that  web site. If you do not have the password, please call the hospital operator.  01/27/2020, 7:47 AM

## 2020-01-27 NOTE — Consult Note (Addendum)
Name: Dwayne Bridges MRN: [id number] DOB: Mar 28, 1951    ADMISSION DATE:  02/05/20 CONSULTATION DATE: 01/18/2020  REFERRING MD : Webb Silversmith, NP    BRIEF PATIENT DESCRIPTION:  69 yo male admitted with hypotension likely secondary to hypovolemic shock secondary to dehydration/overdiuresis and possible sepsis with possible bilateral lower lobe pneumonia vs. partial compressive atelectasis   SIGNIFICANT EVENTS/STUDIES:  03/7: Pt admitted to the stepdown unit with hypotension 03/7: CTA Chest revealed no CT evidence of central pulmonary artery embolus. Moderate cardiomegaly with findings of right heart dysfunction. Correlation with echocardiogram recommended. Small bilateral pleural effusions with bilateral lower lobe partial compressive atelectasis versus pneumonia. Clinical correlation is recommended. Aortic Atherosclerosis  03/8: Pts status changed to ICU due to continue hypotension despite aggressive fluid resuscitation   3/17- patient continues to require BIPAP and has intermittent episodes of obtunded state.  Overall with poor prognosis due to chronic OHS/OSA with hypoxemia, will obtain PALS consult for [place] today.      Review of Systems: 10 point system negative   Other:  All other systems negative  VITAL SIGNS: Temp:  [97.8 F (36.6 C)-99 F (37.2 C)] 98.6 F (37 C) (03/17 0750) Pulse Rate:  [37-130] 65 (03/17 1000) Resp:  [17-38] 26 (03/17 1000) BP: (91-142)/(63-99) 91/79 (03/17 1000) SpO2:  [85 %-100 %] 93 % (03/17 1000) FiO2 (%):  [60 %] 60 % (03/17 0850) Weight:  [109.9 kg-110.4 kg] 109.9 kg (03/17 0500)  Physical Examination:   General Appearance: No distress  Neuro:without focal findings,   HEENT: PERRLA, EOM intact.   Pulmonary: +crackles bilaterally  CardiovascularNormal S1,S2.  No m/r/g.   Abdomen: Benign, Soft, non-tender. Renal:  No costovertebral tenderness  GU:  Not performed at this time. Endoc: No evident thyromegaly Skin:   warm, no rashes, no  ecchymosis  Extremities:2+edema  PSYCHIATRIC: Mood, affect within normal limits.      Recent Labs  Lab 01/25/20 0440 01/26/20 0449 01/27/20 0628  NA 144 146* 143  K 3.4* 3.6 3.0*  CL 88* 88* 85*  CO2 45* 50* 46*  BUN 13 14 14   CREATININE 0.40* 0.38* 0.42*  GLUCOSE 101* 128* 132*   Recent Labs  Lab 01/25/20 0440 01/26/20 0449 01/27/20 0628  HGB 10.3* 10.0* 10.2*  HCT 33.5* 31.9* 32.7*  WBC 10.1 10.4 11.5*  PLT 161 162 169   DG Chest Port 1 View  Result Date: 01/27/2020 CLINICAL DATA:  Aspiration into airway EXAM: PORTABLE CHEST 1 VIEW COMPARISON:  Yesterday FINDINGS: Extensive pulmonary opacity on the right more than left with small pleural effusions confirmed by recent CT. Cardiomegaly and vascular pedicle widening. No visible pneumothorax. IMPRESSION: Extensive airspace disease and right more than left pleural effusion. Opacity is stable or mildly increased from yesterday. Electronically Signed   By: 01/29/2020 M.D.   On: 01/27/2020 07:25   DG Chest Port 1 View  Result Date: 01/26/2020 CLINICAL DATA:  Hypoxic, hypercapnic respiratory failure with bilateral pneumonia EXAM: PORTABLE CHEST 1 VIEW COMPARISON:  Radiograph 01/25/2020 FINDINGS: Diffuse bilateral heterogeneous interstitial and airspace opacities more pronounced in the right mid to lower lung. Overall extent and distribution appears slightly increased from comparison exam 1 day prior. Bilateral layering pleural effusions are seen. Cardiomediastinal contours are stable. No pneumothorax. No acute osseous or soft tissue abnormality. IMPRESSION: 1. Slight interval worsening of diffuse bilateral interstitial and airspace opacities which may represent pulmonary edema, ARDS, multifocal pneumonia, or some combination there of. 2. Bilateral layering pleural effusions. Electronically Signed   By: 01/27/2020  [facility] M.D.   On: 01/26/2020 06:54      ASSESSMENT / PLAN:   Severe metabolic alkalosis -due to chronic CO2  retention with concomitant volume contraction post diuresis -[place] OHS patient with OSA overalp and chronic diastilic CHF -repeat VBG with normal ph  despite relatively elevated CO2 - will continue BIPAP QHS and 1/2 of daytime while diuresing -will continue diuresis for pulmonary edema note on CT chest -repeat CXR as above   Chronic diastolic CHF  - continue diuresis - will start demadex 20 po bid  - monitor mentation   -creatinine is ok  - BIPAP overnight please    Moderate protein calorie malnutrition   - albumin <2.4  - bitemporal wasting and muscular weakness  -RD consult   OSA     Use BIPAP for now RT for settings  Atrial fibrillation  - on cardizem -on eliquis   Critical care provider statement:    Critical care time (minutes):  33   Critical care time was exclusive of:  Separately billable procedures and  treating other patients   Critical care was necessary to treat or prevent imminent or  life-threatening deterioration of the following conditions:  metabilic alkalosisk, hypotension, confusion , moderate protein calorie malnutrionin, multiple comorbid contieions   Critical care was time spent personally by me on the following  activities:  Development of treatment plan with patient or surrogate,  discussions with consultants, evaluation of patient's response to  treatment, examination of patient, obtaining history from patient or  surrogate, ordering and performing treatments and interventions, ordering  and review of laboratory studies and re-evaluation of patient's condition   I assumed direction of critical care for this patient from another  provider in my specialty: no       Ottie Glazier, M.D.  Pulmonary & [place]

## 2020-01-27 NOTE — Progress Notes (Addendum)
Pt. noted coughing and attempting to clear his throat intermittently. Sats dropped to mid 80s with no visible signs of distress. Jeri Modena, NP notified. Order for CXR placed to check for possible aspiration. Pt. O2 increased to 6L via [place]. Sats currently at 90%. If O2 do not increase, order given to place pt. on [facility] Will continue to monitor.

## 2020-01-28 DIAGNOSIS — J9621 Acute and chronic respiratory failure with hypoxia: Secondary | ICD-10-CM

## 2020-01-28 DIAGNOSIS — J9620 Acute and chronic respiratory failure, unspecified whether with hypoxia or hypercapnia: Secondary | ICD-10-CM

## 2020-01-28 DIAGNOSIS — J9622 Acute and chronic respiratory failure with hypercapnia: Secondary | ICD-10-CM

## 2020-01-28 DIAGNOSIS — Z515 Encounter for palliative care: Secondary | ICD-10-CM

## 2020-01-28 DIAGNOSIS — Z7189 Other specified counseling: Secondary | ICD-10-CM

## 2020-01-28 LAB — MAGNESIUM: Magnesium: 1.7 mg/dL (ref 1.7–2.4)

## 2020-01-28 LAB — BASIC METABOLIC PANEL
Anion gap: 13 (ref 5–15)
BUN: 17 mg/dL (ref 8–23)
CO2: 49 mmol/L — ABNORMAL HIGH (ref 22–32)
Calcium: 8.6 mg/dL — ABNORMAL LOW (ref 8.9–10.3)
Chloride: 84 mmol/L — ABNORMAL LOW (ref 98–111)
Creatinine, Ser: 0.4 mg/dL — ABNORMAL LOW (ref 0.61–1.24)
GFR calc Af Amer: >60 mL/min (ref >60)
GFR calc non Af Amer: >60 mL/min (ref >60)
Glucose, Bld: 126 mg/dL — ABNORMAL HIGH (ref 70–99)
Potassium: 3.6 mmol/L (ref 3.5–5.1)
Sodium: 146 mmol/L — ABNORMAL HIGH (ref 135–145)

## 2020-01-28 LAB — CBC
HCT: 31 % — ABNORMAL LOW (ref 39.0–52.0)
Hemoglobin: 9.7 g/dL — ABNORMAL LOW (ref 13.0–17.0)
MCH: 30 pg (ref 26.0–34.0)
MCHC: 31.3 g/dL (ref 30.0–36.0)
MCV: 96 fL (ref 80.0–100.0)
Platelets: 153 10*3/uL (ref 150–400)
RBC: 3.23 MIL/uL — ABNORMAL LOW (ref 4.22–5.81)
RDW: 15.6 % — ABNORMAL HIGH (ref 11.5–15.5)
WBC: 9.2 10*3/uL (ref 4.0–10.5)
nRBC: 0.2 % (ref 0.0–0.2)

## 2020-01-28 LAB — PHOSPHORUS: Phosphorus: 2.2 mg/dL — ABNORMAL LOW (ref 2.5–4.6)

## 2020-01-28 LAB — BRAIN NATRIURETIC PEPTIDE: B Natriuretic Peptide: 228 pg/mL — ABNORMAL HIGH (ref 0.0–100.0)

## 2020-01-28 MED ORDER — ENSURE ENLIVE PO LIQD
237.0000 mL | Freq: Three times a day (TID) | ORAL | Status: DC
  Administered 2020-01-28 – 2020-01-30 (×5): 237 mL via ORAL

## 2020-01-28 MED ORDER — K PHOS MONO-SOD PHOS DI & MONO 155-852-130 MG PO TABS
500.0000 mg | ORAL_TABLET | Freq: Two times a day (BID) | ORAL | Status: AC
  Administered 2020-01-28 (×2): 500 mg via ORAL
  Filled 2020-01-28 (×2): qty 2

## 2020-01-28 MED ORDER — ADULT MULTIVITAMIN W/MINERALS CH
1.0000 | ORAL_TABLET | Freq: Every day | ORAL | Status: DC
  Administered 2020-01-29 – 2020-01-30 (×2): 1 via ORAL
  Filled 2020-01-28 (×2): qty 1

## 2020-01-28 MED ORDER — PRO-STAT SUGAR FREE PO LIQD
30.0000 mL | Freq: Two times a day (BID) | ORAL | Status: DC
  Administered 2020-01-28 – 2020-01-30 (×4): 30 mL via ORAL

## 2020-01-28 NOTE — Progress Notes (Signed)
Name: Dwayne Bridges MRN: [id number] DOB: 13-Sep-1951    ADMISSION DATE:  January 27, 2020 CONSULTATION DATE: 01/18/2020  REFERRING MD : Rufina Falco, NP    BRIEF PATIENT DESCRIPTION:  69 yo male admitted with hypotension likely secondary to hypovolemic shock secondary to dehydration/overdiuresis and possible sepsis with possible bilateral lower lobe pneumonia vs. partial compressive atelectasis   SIGNIFICANT EVENTS/STUDIES:  03/7: Pt admitted to the stepdown unit with hypotension 03/7: CTA Chest revealed no CT evidence of central pulmonary artery embolus. Moderate cardiomegaly with findings of right heart dysfunction. Correlation with echocardiogram recommended. Small bilateral pleural effusions with bilateral lower lobe partial compressive atelectasis versus pneumonia. Clinical correlation is recommended. Aortic Atherosclerosis  03/8: Pts status changed to ICU due to continue hypotension despite aggressive fluid resuscitation   3/17- patient continues to require BIPAP and has intermittent episodes of obtunded state.  Overall with poor prognosis due to chronic OHS/OSA with hypoxemia, will obtain PALS consult for [place] today.  3/18 - mental status has improved, diuresis has been adequate, pulmonary edema has decreased    Review of Systems: 10 point system negative   Other:  All other systems negative  VITAL SIGNS: Temp:  [97.6 F (36.4 C)-98.4 F (36.9 C)] 98.2 F (36.8 C) (03/18 0800) Pulse Rate:  [57-93] 93 (03/18 0900) Resp:  [14-37] 37 (03/18 0900) BP: (85-136)/(51-92) 136/92 (03/18 0900) SpO2:  [94 %-100 %] 100 % (03/18 0900) FiO2 (%):  [58 %-70 %] 70 % (03/18 0200) Weight:  [105.1 kg] 105.1 kg (03/18 0145)  Physical Examination:   General Appearance: No distress  Neuro:without focal findings,   HEENT: PERRLA, EOM intact.   Pulmonary: +crackles bilaterally  CardiovascularNormal S1,S2.  No m/r/g.   Abdomen: Benign, Soft, non-tender. Renal:  No costovertebral tenderness    GU:  Not performed at this time. Endoc: No evident thyromegaly Skin:   warm, no rashes, no ecchymosis  Extremities:2+edema  PSYCHIATRIC: Mood, affect within normal limits.      Recent Labs  Lab 01/26/20 0449 01/27/20 0628 01/28/20 0425  NA 146* 143 146*  K 3.6 3.0* 3.6  CL 88* 85* 84*  CO2 50* 46* 49*  BUN 14 14 17   CREATININE 0.38* 0.42* 0.40*  GLUCOSE 128* 132* 126*   Recent Labs  Lab 01/26/20 0449 01/27/20 0628 01/28/20 0425  HGB 10.0* 10.2* 9.7*  HCT 31.9* 32.7* 31.0*  WBC 10.4 11.5* 9.2  PLT 162 169 153   DG Chest Port 1 View  Result Date: 01/27/2020 CLINICAL DATA:  Aspiration into airway EXAM: PORTABLE CHEST 1 VIEW COMPARISON:  Yesterday FINDINGS: Extensive pulmonary opacity on the right more than left with small pleural effusions confirmed by recent CT. Cardiomegaly and vascular pedicle widening. No visible pneumothorax. IMPRESSION: Extensive airspace disease and right more than left pleural effusion. Opacity is stable or mildly increased from yesterday. Electronically Signed   By: Monte Fantasia M.D.   On: 01/27/2020 07:25      ASSESSMENT / PLAN:   Severe metabolic alkalosis -due to chronic CO2 retention with concomitant volume contraction post diuresis -[place] OHS patient with OSA overalp and chronic diastilic CHF -repeat VBG with normal ph  despite relatively elevated CO2 - will continue BIPAP QHS and 1/2 of daytime while diuresing -will continue diuresis for pulmonary edema note on CT chest -repeat CXR as above   Chronic diastolic CHF  - continue diuresis - will start demadex 20 po bid  - monitor mentation   -creatinine is within reference range  - BIPAP overnight was  not well tolerated by patient and he became more obtunded    Moderate protein calorie malnutrition   - albumin <2.4  - bitemporal wasting and muscular weakness  -RD consult   OSA     Use BIPAP for now RT for settings  Atrial fibrillation  - on cardizem -on  eliquis   Critical care provider statement:    Critical care time (minutes):  33   Critical care time was exclusive of:  Separately billable procedures and  treating other patients   Critical care was necessary to treat or prevent imminent or  life-threatening deterioration of the following conditions:  metabilic alkalosisk, hypotension, confusion , moderate protein calorie malnutrionin, multiple comorbid contieions   Critical care was time spent personally by me on the following  activities:  Development of treatment plan with patient or surrogate,  discussions with consultants, evaluation of patient's response to  treatment, examination of patient, obtaining history from patient or  surrogate, ordering and performing treatments and interventions, ordering  and review of laboratory studies and re-evaluation of patient's condition   I assumed direction of critical care for this patient from another  provider in my specialty: no       Vida Rigger, M.D.  Pulmonary & Critical Care Medicine  [facility]

## 2020-01-28 NOTE — Progress Notes (Signed)
PROGRESS NOTE    Dwayne Bridges  KTG:[id number] DOB: 09-Oct-1951 DOA: 01/12/2020 PCP: Patient, [name] Per   Brief Narrative:  69 yo with hx of HTN, HLD, GERD, anxiety, dHF, bipolar disorder, COVID infectioon in May 2020, atrial fibrillation on eliquis who presented with hypotension and decreased PO intake.  He was recently discharged on 3/2, he'd been treated for HF exacerbation at that admission and was diuresed.  He represented with hypotension and concern for possible sepsis from pneumonia.  He was admitted to the ICU due to persistent hypotension despite aggressive IVF.  He's been transferred back to the hospitalist service on 3/10.  He's been treated with IV antibiotics and gradually improved, though he had worsening peripheral edema.  He was restarted on diuresis.  He'd previously been discharged with nightly bipap and this was restarted as well.  He currently has severe metabolic acidosis which is complicating diuresis.  Bipap has been discontinued due to concerns for aspiration.  Assessment & Plan:   Acute Hypoxic Respiratory Failure 2/2 Pneumonia and Heart Failure: Currently requiring Bipap for oxygenation. S/p broad spectrum abx x7 days I discontinued his bipap last night with concern for aspiration on bipap (seemed hoarser, with wet voice/breath sounds after we'd started bipap)  Due to worsening CXR findings, will continue abx, narrow to unasyn and follow (? Aspiration) SLP eval pending -> mechanical soft with thin liquid (see note) CXR 3/14 am with improved edema CXR 3/15 with findings concerning for HF vs atypical infection - increasing opacification in R hemithorax 2/2 increasing volume of pleural fluid and atelectatic change vs developing infection/consolidation CXR 3/16 with slight interval worsening of bilateral interstitial and airspace opacities concerning for edema (vs ARDS or pneumonia) Pulmonology c/s, appreciate recs  HFpEF Exacerbation: echo from 2/26 with EF 60-65%,  indeterminate LV diastolic parameters (see report).  Holding diuresis at this time due to hypotension at presentation. Repeat echo with findings of R heart dysfunction on CT scan - repeat echo with mildly elevate pulmonary artery systolic pressure Follow I/O, daily weights (net positive 1.3 L and weight up to 109.3 kg on 3/14 --- he was 99.6 kg on 3/2 day of discharge)   Wt Readings from Last 3 Encounters:  01/28/20 105.1 kg  01/12/20 99.6 kg  07/02/19 428.7 kg   Metabolic Alkalosis  Chronic Hypercarbic Respiratory Failure:  Pulmonology following pt closely for his RF.  We will repeat VBG.  Hypernatremia: starting to get dehydrated - hold lasix  Hypotension secondary to Septic Shock vs Overdiuresis  Bilateral Lower Lobe Pneumonia:  Continue cefepime (3/7 - present), flagyl (3/8 - present) - plan for 7 days (adding abx as noted above) Blood cx NGTD, urine cx negative I/O, daily weights.  IS, flutter valve. SLP eval -> recommending mechanical soft  Hypokalemia  Hypophosphatemia: replaced and follow  HTN: BP meds on hold  HLD: continue lipitor  Atrial Fibrillation: continue eliquis.  Continue diltiazem and prn metop.  Bipolar Disorder: continue depakote  ? cognitive deficit: A&Ox3, but niece suspects he may have undiagnosed autism.  Delirium precautions. Of note, he does not complain and tends to answer questions positively (has never complained for me).  DVT prophylaxis: eliquis  Code Status: DNR Family Communication: niece 3/15 Disposition Plan:  . Patient came from: SNF            . Anticipated d/c place: SNF . Barriers to d/c OR conditions which need to be met to effect a safe d/c: pending improvement in oxygenation, pt is requiring  bipap for continued support.   Consultants:   Critical Care  Procedures:  Echo IMPRESSIONS    1. Left ventricular ejection fraction, by estimation, is 60 to 65%. The  left ventricle has normal function. The left ventricle has no  regional  wall motion abnormalities.  2. Right ventricular systolic function is normal. The right ventricular  size is normal. There is mildly elevated pulmonary artery systolic  pressure.  3. The mitral valve is normal in structure. Mild mitral valve  regurgitation. No evidence of mitral stenosis.  4. The aortic valve is normal in structure. Aortic valve regurgitation is  not visualized. No aortic stenosis is present.  5. The inferior vena cava is normal in size with greater than 50%  respiratory variability, suggesting right atrial pressure of 3 mmHg.   Antimicrobials: Anti-infectives (From admission, onward)   Start     Dose/Rate Route Frequency Ordered Stop   01/25/20 1800  Ampicillin-Sulbactam (UNASYN) 3 g in sodium chloride 0.9 % 100 mL IVPB  Status:  Discontinued     3 g 200 mL/hr over 30 Minutes Intravenous Every 6 hours 01/25/20 1732 01/26/20 1033   01/18/20 1400  ceFEPIme (MAXIPIME) 2 g in sodium chloride 0.9 % 100 mL IVPB     2 g 200 mL/hr over 30 Minutes Intravenous Every 8 hours 01/18/20 1012 01/25/20 1230   01/18/20 1015  metroNIDAZOLE (FLAGYL) IVPB 500 mg     500 mg 100 mL/hr over 60 Minutes Intravenous Every 8 hours 01/18/20 1012 01/25/20 1014   01/18/20 0900  ceFEPIme (MAXIPIME) 2 g in sodium chloride 0.9 % 100 mL IVPB  Status:  Discontinued     2 g 200 mL/hr over 30 Minutes Intravenous Every 12 hours 01/18/20 0505 01/18/20 1012   01/23/2020 2130  vancomycin (VANCOCIN) IVPB 1000 mg/200 mL premix  Status:  Discontinued     1,000 mg 200 mL/hr over 60 Minutes Intravenous  Once 02/01/2020 2116 01/23/2020 2128   02/07/2020 2130  ceFEPIme (MAXIPIME) 2 g in sodium chloride 0.9 % 100 mL IVPB     2 g 200 mL/hr over 30 Minutes Intravenous  Once 01/30/2020 2116 02/10/2020 2219   01/29/2020 2130  vancomycin (VANCOREADY) IVPB 2000 mg/400 mL     2,000 mg 200 mL/hr over 120 Minutes Intravenous  Once 01/12/2020 2128 01/18/20 0038     Subjective: More talkative today A&Ox3 Does not  complain about much in general - generally answers positively to questions Says he likes it better off bipap  01/27/20: Pt is alert,awake and cooperative o2 sats are in 80's and d/w RT to start Bipap or high flow [place] , pt is also been hypotensive and has guarded prognosis due to need for continuous ventilatory  Support with bipap . Pt does not report any other complaints.   Objective: Vitals:   01/28/20 0600 01/28/20 0700 01/28/20 0800 01/28/20 0900  BP: 108/85 111/73 113/74 (!) 136/92  Pulse: 92 63 64 93  Resp: (!) 28 (!) 27 (!) 26 (!) 37  Temp:   98.2 F (36.8 C)   TempSrc:   Oral   SpO2: 97% 100% 100% 100%  Weight:      Height:        Intake/Output Summary (Last 24 hours) at 01/28/2020 1316 Last data filed at 01/28/2020 0900 Gross per 24 hour  Intake 338.5 ml  Output 1580 ml  Net -1241.5 ml   Filed Weights   01/26/20 1432 01/27/20 0500 01/28/20 0145  Weight: 110.4 kg 109.9 kg 105.1  kg   Examination: Blood pressure (!) 136/92, pulse 93, temperature 98.2 F (36.8 C), temperature source Oral, resp. rate (!) 37, height 5' 11"  (1.803 m), weight 105.1 kg, SpO2 100 %. General: No acute distress. Cardiovascular: irregular rate and rhythm. Lungs: bl wheezing on inspiration and expiration.  Abdomen: Soft, nontender, nondistended  Neurological: Alert and oriented 3. Moves all extremities 4 . Cranial nerves II through XII grossly intact. Skin: Warm and dry. No rashes or lesions. Extremities: Bilateral lower extremity edema.  Data Reviewed:  CBC: Recent Labs  Lab 01/23/20 0543 01/23/20 0543 01/24/20 0529 01/25/20 0440 01/26/20 0449 01/27/20 0628 01/28/20 0425  WBC 8.4   < > 13.6* 10.1 10.4 11.5* 9.2  NEUTROABS 6.5  --  11.7* 7.9* 8.2* 9.1*  --   HGB 10.5*   < > 11.6* 10.3* 10.0* 10.2* 9.7*  HCT 33.8*   < > 36.8* 33.5* 31.9* 32.7* 31.0*  MCV 96.3   < > 96.1 96.8 96.4 95.6 96.0  PLT 181   < > 208 161 162 169 153   < > = values in this interval not displayed.   Basic  Metabolic Panel: Recent Labs  Lab 01/24/20 0529 01/24/20 0529 01/24/20 1542 01/25/20 0440 01/26/20 0449 01/27/20 0628 01/28/20 0425  NA 141   < > 143 144 146* 143 146*  K 4.3   < > 3.7 3.4* 3.6 3.0* 3.6  CL 91*   < > 89* 88* 88* 85* 84*  CO2 40*   < > 44* 45* 50* 46* 49*  GLUCOSE 134*   < > 125* 101* 128* 132* 126*  BUN 13   < > 13 13 14 14 17   CREATININE 0.40*   < > 0.47* 0.40* 0.38* 0.42* 0.40*  CALCIUM 8.7*   < > 8.3* 8.5* 8.4* 8.6* 8.6*  MG 1.6*  --   --  1.8 1.6* 1.8 1.7  PHOS 2.9  --   --  2.1* 2.5 2.9 2.2*   < > = values in this interval not displayed.   GFR: Estimated Creatinine Clearance: 104.5 mL/min (A) (by C-G formula based on SCr of 0.4 mg/dL (L)). Liver Function Tests: Recent Labs  Lab 01/23/20 0543 01/24/20 0529 01/25/20 0440 01/26/20 0449 01/27/20 0628  AST 14* 16 13* 15 15  ALT 10 12 11 12 11   ALKPHOS 49 58 50 50 56  BILITOT 0.6 0.8 0.8 0.9 1.0  PROT 5.5* 6.2* 6.0* 6.0* 6.8  ALBUMIN 2.2* 2.4* 2.4* 2.2* 2.7*   CBG: Recent Labs  Lab 01/24/20 1258  GLUCAP 187*   No results found for this or any previous visit (from the past 240 hour(s)).   Radiology Studies: DG Chest Port 1 View  Result Date: 01/27/2020 CLINICAL DATA:  Aspiration into airway EXAM: PORTABLE CHEST 1 VIEW COMPARISON:  Yesterday FINDINGS: Extensive pulmonary opacity on the right more than left with small pleural effusions confirmed by recent CT. Cardiomegaly and vascular pedicle widening. No visible pneumothorax. IMPRESSION: Extensive airspace disease and right more than left pleural effusion. Opacity is stable or mildly increased from yesterday. Electronically Signed   By: Monte Fantasia M.D.   On: 01/27/2020 07:25    Scheduled Meds: . apixaban  5 mg Oral BID  . atorvastatin  20 mg Oral QHS  . Chlorhexidine Gluconate Cloth  6 each Topical Daily  . diltiazem  60 mg Oral Q6H  . divalproex  375 mg Oral BID  . feeding supplement (ENSURE ENLIVE)  237 mL Oral BID BM  .  pantoprazole  (PROTONIX) IV  40 mg Intravenous Daily  . phosphorus  500 mg Oral BID  . polyvinyl alcohol  1 drop Both Eyes QID  . senna-docusate  1 tablet Oral BID  . torsemide  20 mg Oral BID   Continuous Infusions: . sodium chloride 10 mL/hr at 01/24/20 1056  . albumin human 12.5 g (01/28/20 0851)     LOS: 10 days    Time spent: over 20 min Para Skeans, MD [facility] To contact the attending provider between 7A-7P or the covering provider during after hours 7P-7A, please log into the web site [email or web address] and access using universal [place] password for that web site. If you do not have the password, please call the hospital operator.  01/28/2020, 1:16 PM

## 2020-01-28 NOTE — Consult Note (Signed)
Consultation Note Date: 01/28/2020   Patient Name: Dwayne Bridges  DOB: 05-07-1951  MRN: [id number]  Age / Sex: 69 y.o., male  PCP: Patient, [name] Per Referring Physician: Para Skeans, MD  Reason for Consultation: Establishing goals of care  HPI/Patient Profile: 69 y.o. male  with past medical history of chronic OHS, HTN, HLD, GERD, anxiety, dCHF, bipolar disorder, COVID infection in MAY 2020, a fib on eliquis, and malnutrition admitted on February 16, 2020 with hypotension and decreased PO intake. Patient comes from [facility]. He was recently admitted for CHF exacerbation and d/c'd 3/2. Patient has been treated with broad spectrum antibiotics d/t possible sepsis. Patient was also treated with bipap - however, when patient is placed on bipap he becomes very lethargic. PMT consulted for [place].  Clinical Assessment and Goals of Care: I have reviewed medical records including EPIC notes, labs and imaging, received report from RN and Dr. Lanney Gins, assessed the patient and then spoke with patient's niece Dwayne Bridges to discuss diagnosis prognosis, Dwayne Bridges, EOL wishes, disposition and options.  I attempted to discuss care with Dwayne Bridges however he does not speak much. He gives me a few one word answers (no he is not in pain) then quickly falls back to sleep.   Dwayne Bridges confirms that she is HCPOA. There are other family members (siblings) but they have not been involved in medical decision making.  I introduced Palliative Medicine as specialized medical care for people living with serious illness. It focuses on providing relief from the symptoms and stress of a serious illness. The goal is to improve quality of life for both the patient and the family.  We discussed a brief life review of the patient. Dwayne Bridges tells me patient has lived at [facility] for 6 years. 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" - he did very well at the facility and had lots of friends.  She tells me the reason he required facility admission was not d/t his health - he was not able to care for himself adequately. Patient was cared for his mother all of his life and when she got dementia Dwayne Bridges tried to care for Dwayne Bridges at home. Eventually, he required facility admission.   Bridges further describes Dwayne Bridges cognitive impairment - she tells me he is child-like and has the mental capacity of an 69 year old. When he gets upset, he  "throws fits". She does further explain that the patient graduated from high school, had a job, and was able to drive.   Dwayne Bridges tells me the patient was doing great until his COVID infection last [facility]. Apparently he was diagnosed with heart failure after his infection and has had multiple hospital admissions since. He has not been getting out of bed at [facility]. Dwayne Bridges wonders if depression is adding to Dwayne Bridges status.   We discussed his current illness and what it means in the larger context of his on-going co-morbidities.  Natural disease trajectory and expectations at EOL were discussed. We discussed concerns about patient's respiratory status d/t his OHS/OSA and heart failure. Discussed that patient becomes unresponsive when placed on bipap. Discussed concern that patient will continue to be readmitted to hospital.  I attempted to elicit values and goals of care important to the patient.  Dwayne Bridges shares that she definitely does not want patient to continue bouncing in and out of hospital.  The difference between aggressive medical intervention and comfort care was considered in light of the  patient's goals of care. Dwayne Bridges is not sure how to move forward with Dwayne Bridges care and requests time to consider this.   Hospice and Palliative Care services outpatient were explained and offered. We discussed that patient could return to [facility] and have hospice care for him there to help ensure he does not suffer and promote quality of life as he nears end of  life.   Dwayne Bridges is concerned she would not be able to see Dwayne Bridges d/t visitor limitation at [facility].   We planned to meet in person tomorrow at 11 am to further discuss GOC.  Questions and concerns were addressed. The family was encouraged to call with questions or concerns.   Primary Decision Maker HCPOA - niece - Dwayne Bridges   SUMMARY OF RECOMMENDATIONS   - Dwayne Bridges considering how to move forward with care, considering hospice but unsure - I did call [facility] to enquire about visitation for patients under hospice/at end of life - they will allow visitors - plan to follow up with Dwayne Bridges in person tomorrow at 11 am  Code Status/Advance Care Planning:  DNR   Symptom Management:   Per primary/critical care  Psycho-social/Spiritual:   Desire for further Chaplaincy support:no  Additional Recommendations: Education on Hospice  Prognosis:   Unable to determine - depends on plan of care  Discharge Planning: To Be Determined      Primary Diagnoses: Present on Admission: . AKI (acute Bridges injury) (HCC) . HTN (hypertension) . BPH (benign prostatic hyperplasia) . HLD (hyperlipidemia) . Atrial fibrillation, chronic (HCC) . Bipolar disorder, unspecified (HCC) . Dehydration . Hypotension . Hypokalemia . Abdominal distension . Hyponatremia . Septic shock (HCC)   I have reviewed the medical record, interviewed the patient and family, and examined the patient. The following aspects are pertinent.  Past Medical History:  Diagnosis Date  . Anxiety   . Bipolar disorder, unspecified (HCC)   . CHF (congestive heart failure) (HCC)   . Disorientation, unspecified   . GERD (gastroesophageal reflux disease)   . Hypertension   . Hypokalemia   . Sleep apnea, unspecified    Social History   Socioeconomic History  . Marital status: Single    Spouse name: Not on file  . Number of children: Not on file  . Years of education: Not on file  . Highest education level: Not on  file  Occupational History  . Not on file  Tobacco Use  . Smoking status: Former Smoker    Packs/day: 0.50    Years: 10.00    Pack years: 5.00    Types: Cigarettes    Quit date: 11/12/2008    Years since quitting: 11.2  . Smokeless tobacco: Never Used  Substance and Sexual Activity  . Alcohol use: Never  . Drug use: Not on file  . Sexual activity: Not on file  Other Topics Concern  . Not on file  Social History Narrative  . Not on file   Social Determinants of Health   Financial Resource Strain:   . Difficulty of Paying Living Expenses:   Food Insecurity:   . Worried About Programme researcher, broadcasting/film/video in the Last Year:   . Barista in the Last Year:   Transportation Needs:   . Freight forwarder (Medical):   Marland Kitchen Lack of Transportation (Non-Medical):   Physical Activity:   . Days of Exercise per Week:   . Minutes of Exercise per Session:   Stress:   . Feeling  of Stress :   Social Connections:   . Frequency of Communication with Friends and Family:   . Frequency of Social Gatherings with Friends and Family:   . Attends Religious Services:   . Active Member of Clubs or Organizations:   . Attends Banker Meetings:   Marland Kitchen Marital Status:    Family History  Problem Relation Age of Onset  . Hypertension Other    Scheduled Meds: . apixaban  5 mg Oral BID  . atorvastatin  20 mg Oral QHS  . Chlorhexidine Gluconate Cloth  6 each Topical Daily  . diltiazem  60 mg Oral Q6H  . divalproex  375 mg Oral BID  . feeding supplement (ENSURE ENLIVE)  237 mL Oral TID BM  . feeding supplement (PRO-STAT SUGAR FREE 64)  30 mL Oral BID WC  . [START ON 01/29/2020] multivitamin with minerals  1 tablet Oral Daily  . pantoprazole (PROTONIX) IV  40 mg Intravenous Daily  . phosphorus  500 mg Oral BID  . polyvinyl alcohol  1 drop Both Eyes QID  . senna-docusate  1 tablet Oral BID  . torsemide  20 mg Oral BID   Continuous Infusions: . sodium chloride 10 mL/hr at 01/24/20 1056  .  albumin human 60 mL/hr at 01/28/20 1500   PRN Meds:.acetaminophen **OR** acetaminophen, HYDROcodone-acetaminophen, ipratropium-albuterol, metoprolol tartrate, ondansetron **OR** ondansetron (ZOFRAN) IV No Known Allergies Review of Systems  Unable to perform ROS: Mental status change    Physical Exam Constitutional:      General: He is not in acute distress.    Comments: Sleeping, difficult to arouse  Pulmonary:     Effort: Pulmonary effort is normal. No respiratory distress.  Skin:    General: Skin is warm and dry.     Vital Signs: BP 121/79   Pulse 73   Temp 98.2 F (36.8 C) (Oral)   Resp (!) 28   Ht 5\' 11"  (1.803 m)   Wt 105.1 kg   SpO2 100%   BMI 32.32 kg/m  Pain Scale: 0-10 POSS *See Group Information*: 1-Acceptable,Awake and alert Pain Score: 0-No pain   SpO2: SpO2: 100 % O2 Device:SpO2: 100 % O2 Flow Rate: .O2 Flow Rate (L/min): 15 L/min  IO: Intake/output summary:   Intake/Output Summary (Last 24 hours) at 01/28/2020 1532 Last data filed at 01/28/2020 1500 Gross per 24 hour  Intake 458.5 ml  Output 1450 ml  Net -991.5 ml    LBM: Last BM Date: 01/26/20 Baseline Weight: Weight: 99.6 kg Most recent weight: Weight: 105.1 kg     Palliative Assessment/Data: PPS 40%    Time Total: 70 minutes Greater than 50%  of this time was spent counseling and coordinating care related to the above assessment and plan.  01/28/20, DNP, AGNP-C Palliative Medicine Team [phone] Pager: [phone]

## 2020-01-28 NOTE — Progress Notes (Signed)
Nutrition Follow-up  DOCUMENTATION CODES:   Obesity unspecified  INTERVENTION:  Recommend liberalizing diet to regular. Continue diet notes per SLP.  Increase to Ensure Enlive po TID, each supplement provides 350 kcal and 20 grams of protein.  Provide Pro-Stat 30 mL po BID with meals, each supplement provides 100 kcal and 15 grams of protein.  Continue Magic cup TID with meals, each supplement provides 290 kcal and 9 grams of protein.  Provide daily MVI.  NUTRITION DIAGNOSIS:   Inadequate oral intake related to lethargy/confusion, decreased appetite as evidenced by meal completion < 50%.  Ongoing.  GOAL:   Patient will meet greater than or equal to 90% of their needs  Progressing.  MONITOR:   PO intake, Supplement acceptance, Labs, Weight trends, I & O's  REASON FOR ASSESSMENT:   Consult Malnutrition Eval  ASSESSMENT:   69 year old male with PMHx of HTN, CHF, bipolar disorder, anxiety, GERD, sleep apnea admitted from [facility] with acute hypoxic and hypercapnic respiratory failure from bilateral PNA, septic shock.  Patient was sleeping at time of RD assessment this morning. Yesterday he was on BiPAP and not able to eat much except for some breakfast and then a bottle of Ensure in the morning. Today he had 20% of his breakfast. PO intake remains inadequate (overall 25-50% of meals). He is drinking Ensure regularly. Pending palliative medicine consult.  Medications reviewed and include: pantoprazole, K Phos neutral 500 mg BID, senna-docusate 1 tablet BID, torsemide, human albumin 12.5 grams IV daily.  Labs reviewed: Sodium 146, Chloride 84, CO2 49, Creatinine 0.4, Phosphorus 2.2.  Diet Order:   Diet Order            Diet Heart Room service appropriate? Yes with Assist; Fluid consistency: Thin  Diet effective now             EDUCATION NEEDS:   No education needs have been identified at this time  Skin:  Skin Assessment: Skin Integrity Issues:(wound to  sacrum; ecchymosis)  Last BM:  01/26/2020 per chart  Height:   Ht Readings from Last 1 Encounters:  01/18/20 5\' 11"  (1.803 m)   Weight:   Wt Readings from Last 1 Encounters:  01/28/20 105.1 kg   Ideal Body Weight:  78.2 kg  BMI:  Body mass index is 32.32 kg/m.  Estimated Nutritional Needs:   Kcal:  2100-2300  Protein:  105-115 grams  Fluid:  2 L/day  01/30/20, MS, RD, LDN Pager number available on Amion

## 2020-01-28 NOTE — Progress Notes (Signed)
Pharmacy Electrolyte Monitoring Consult:  Pharmacy consulted to assist in monitoring and replacing electrolytes in this 69 y.o. male admitted on 01/11/2020 with hypoxic and hypercapnic respiratory failure from bilateral pneumonia.   Labs:  Sodium (mmol/L)  Date Value  01/28/2020 146 (H)  12/05/2013 142   Potassium (mmol/L)  Date Value  01/28/2020 3.6  12/05/2013 3.1 (L)   Magnesium (mg/dL)  Date Value  07/18/255 1.7  12/05/2013 1.8   Phosphorus (mg/dL)  Date Value  15/48/8457 2.2 (L)   Calcium (mg/dL)  Date Value  33/44/8301 8.6 (L)   Calcium, Total (mg/dL)  Date Value  59/96/8957 8.4 (L)   Albumin (g/dL)  Date Value  01/02/2668 2.7 (L)  12/01/2013 2.7 (L)    Assessment/Plan: Patient continues on torsemide 20 mg BID.  K Phos Neutral 500 mg x 2 doses.  Will replace for goal potassium ~ 4, magnesium ~ 2, and phosphorus ~ 3.   Follow up electrolytes with morning labs.  Pricilla Riffle, PharmD 01/28/2020 3:48 PM

## 2020-01-29 DIAGNOSIS — Z7189 Other specified counseling: Secondary | ICD-10-CM

## 2020-01-29 DIAGNOSIS — Z515 Encounter for palliative care: Secondary | ICD-10-CM

## 2020-01-29 LAB — BASIC METABOLIC PANEL
BUN: 18 mg/dL (ref 8–23)
CO2: >50 mmol/L — ABNORMAL HIGH (ref 22–32)
Calcium: 8.9 mg/dL (ref 8.9–10.3)
Chloride: 85 mmol/L — ABNORMAL LOW (ref 98–111)
Creatinine, Ser: 0.43 mg/dL — ABNORMAL LOW (ref 0.61–1.24)
GFR calc Af Amer: >60 mL/min (ref >60)
GFR calc non Af Amer: >60 mL/min (ref >60)
Glucose, Bld: 132 mg/dL — ABNORMAL HIGH (ref 70–99)
Potassium: 3.4 mmol/L — ABNORMAL LOW (ref 3.5–5.1)
Sodium: 150 mmol/L — ABNORMAL HIGH (ref 135–145)

## 2020-01-29 LAB — CBC WITH DIFFERENTIAL/PLATELET
Abs Immature Granulocytes: 0.35 10*3/uL — ABNORMAL HIGH (ref 0.00–0.07)
Basophils Absolute: 0.1 10*3/uL (ref 0.0–0.1)
Basophils Relative: 1 %
Eosinophils Absolute: 0 10*3/uL (ref 0.0–0.5)
Eosinophils Relative: 0 %
HCT: 34.1 % — ABNORMAL LOW (ref 39.0–52.0)
Hemoglobin: 10.3 g/dL — ABNORMAL LOW (ref 13.0–17.0)
Immature Granulocytes: 3 %
Lymphocytes Relative: 9 %
Lymphs Abs: 1 10*3/uL (ref 0.7–4.0)
MCH: 30 pg (ref 26.0–34.0)
MCHC: 30.2 g/dL (ref 30.0–36.0)
MCV: 99.4 fL (ref 80.0–100.0)
Monocytes Absolute: 0.8 10*3/uL (ref 0.1–1.0)
Monocytes Relative: 7 %
Neutro Abs: 8.9 10*3/uL — ABNORMAL HIGH (ref 1.7–7.7)
Neutrophils Relative %: 80 %
Platelets: 181 10*3/uL (ref 150–400)
RBC: 3.43 MIL/uL — ABNORMAL LOW (ref 4.22–5.81)
RDW: 15.6 % — ABNORMAL HIGH (ref 11.5–15.5)
WBC: 11.2 10*3/uL — ABNORMAL HIGH (ref 4.0–10.5)
nRBC: 0.3 % — ABNORMAL HIGH (ref 0.0–0.2)

## 2020-01-29 LAB — BLOOD GAS, VENOUS
Acid-Base Excess: 31.7 mmol/L — ABNORMAL HIGH (ref 0.0–2.0)
Bicarbonate: 64.5 mmol/L — ABNORMAL HIGH (ref 20.0–28.0)
O2 Saturation: 92.3 %
Patient temperature: 37
pCO2, Ven: 109 mmHg (ref 44.0–60.0)
pH, Ven: 7.38 (ref 7.250–7.430)
pO2, Ven: 66 mmHg — ABNORMAL HIGH (ref 32.0–45.0)

## 2020-01-29 LAB — PHOSPHORUS: Phosphorus: 3.1 mg/dL (ref 2.5–4.6)

## 2020-01-29 LAB — FUNGITELL, SERUM: Fungitell Result: <31 pg/mL (ref <80)

## 2020-01-29 LAB — MAGNESIUM: Magnesium: 1.8 mg/dL (ref 1.7–2.4)

## 2020-01-29 MED ORDER — MAGNESIUM SULFATE 2 GM/50ML IV SOLN
2.0000 g | Freq: Once | INTRAVENOUS | Status: AC
  Administered 2020-01-29: 2 g via INTRAVENOUS
  Filled 2020-01-29: qty 50

## 2020-01-29 MED ORDER — POTASSIUM CHLORIDE 20 MEQ PO PACK
40.0000 meq | PACK | Freq: Two times a day (BID) | ORAL | Status: AC
  Administered 2020-01-29 (×2): 40 meq via ORAL
  Filled 2020-01-29 (×2): qty 2

## 2020-01-29 MED ORDER — PANTOPRAZOLE SODIUM 40 MG PO TBEC
40.0000 mg | DELAYED_RELEASE_TABLET | Freq: Every day | ORAL | Status: DC
  Administered 2020-01-30: 40 mg via ORAL
  Filled 2020-01-29: qty 1

## 2020-01-29 NOTE — Progress Notes (Signed)
PROGRESS NOTE    Dwayne Bridges  DEY:[id number] DOB: [birth year] DOA: 01/16/2020 PCP: Patient, [name] Per   Brief Narrative:  69 yo with hx of HTN, HLD, GERD, anxiety, dHF, bipolar disorder, COVID infectioon in May 2020, atrial fibrillation on eliquis who presented with hypotension and decreased PO intake.  He was recently discharged on 3/2, he'd been treated for HF exacerbation at that admission and was diuresed.  He represented with hypotension and concern for possible sepsis from pneumonia.  He was admitted to the ICU due to persistent hypotension despite aggressive IVF.  He's been transferred back to the hospitalist service on 3/10.  He's been treated with IV antibiotics and gradually improved, though he had worsening peripheral edema.  He was restarted on diuresis.  He'd previously been discharged with nightly bipap and this was restarted as well.  He currently has severe metabolic acidosis which is complicating diuresis.  Bipap has been discontinued due to concerns for aspiration.  Assessment & Plan:   Acute Hypoxic Respiratory Failure 2/2 Pneumonia and Heart Failure: Currently requiring Bipap for oxygenation. S/p broad spectrum abx x7 days I discontinued his bipap last night with concern for aspiration on bipap (seemed hoarser, with wet voice/breath sounds after we'd started bipap)  Due to worsening CXR findings, will continue abx, narrow to unasyn and follow (? Aspiration) SLP eval pending -> mechanical soft with thin liquid (see note) CXR 3/14 am with improved edema CXR 3/15 with findings concerning for HF vs atypical infection - increasing opacification in R hemithorax 2/2 increasing volume of pleural fluid and atelectatic change vs developing infection/consolidation CXR 3/16 with slight interval worsening of bilateral interstitial and airspace opacities concerning for edema (vs ARDS or pneumonia)   HFpEF Exacerbation: echo from 2/26 with EF 60-65%, indeterminate LV diastolic parameters  (see report).  Holding diuresis at this time due to hypotension at presentation. Repeat echo with findings of R heart dysfunction on CT scan - repeat echo with mildly elevate pulmonary artery systolic pressure Follow I/O, daily weights  Wt Readings from Last 3 Encounters:  01/29/20 103.9 kg  01/12/20 99.6 kg  07/02/19 314.9 kg   Metabolic Alkalosis  Chronic Hypercarbic Respiratory Failure:  Pulmonology following pt closely for his RF.  Pt has osa and ohs with hypoxia and has poor prognosis.  Hypernatremia: starting to get dehydrated - hold lasix  Hypotension secondary to Septic Shock vs Overdiuresis  Bilateral Lower Lobe Pneumonia:  Continue cefepime (3/7 - present), flagyl (3/8 - present) - plan for 7 days (adding abx as noted above) Blood cx NGTD, urine cx negative I/O, daily weights.  IS, flutter valve. SLP eval -> recommending mechanical soft  Hypokalemia  Hypophosphatemia: replaced and follow  HTN: BP meds on hold  HLD: continue lipitor  Atrial Fibrillation: continue eliquis.  Continue diltiazem and prn metop.  Bipolar Disorder: continue depakote  ? cognitive deficit: A&Ox3, but niece suspects he may have undiagnosed autism.  Delirium precautions. Of note, he does not complain and tends to answer questions positively (has never complained for me).  DVT prophylaxis: eliquis  Code Status: DNR Family Communication: niece 3/15 Disposition Plan:  . Patient came from: SNF            . Anticipated d/c place: SNF . Barriers to d/c OR conditions which need to be met to effect a safe d/c: pending improvement in oxygenation, pt is requiring bipap for continued support.   Consultants:   Critical Care  Procedures:  Echo IMPRESSIONS  1. Left ventricular ejection fraction, by estimation, is 60 to 65%. The  left ventricle has normal function. The left ventricle has no regional  wall motion abnormalities.   2. Right ventricular systolic function is normal. The right  ventricular  size is normal. There is mildly elevated pulmonary artery systolic  pressure.   3. The mitral valve is normal in structure. Mild mitral valve  regurgitation. No evidence of mitral stenosis.   4. The aortic valve is normal in structure. Aortic valve regurgitation is  not visualized. No aortic stenosis is present.   5. The inferior vena cava is normal in size with greater than 50%  respiratory variability, suggesting right atrial pressure of 3 mmHg.   Antimicrobials: Anti-infectives (From admission, onward)    Start     Dose/Rate Route Frequency Ordered Stop   01/25/20 1800  Ampicillin-Sulbactam (UNASYN) 3 g in sodium chloride 0.9 % 100 mL IVPB  Status:  Discontinued     3 g 200 mL/hr over 30 Minutes Intravenous Every 6 hours 01/25/20 1732 01/26/20 1033   01/18/20 1400  ceFEPIme (MAXIPIME) 2 g in sodium chloride 0.9 % 100 mL IVPB     2 g 200 mL/hr over 30 Minutes Intravenous Every 8 hours 01/18/20 1012 01/25/20 1230   01/18/20 1015  metroNIDAZOLE (FLAGYL) IVPB 500 mg     500 mg 100 mL/hr over 60 Minutes Intravenous Every 8 hours 01/18/20 1012 01/25/20 1014   01/18/20 0900  ceFEPIme (MAXIPIME) 2 g in sodium chloride 0.9 % 100 mL IVPB  Status:  Discontinued     2 g 200 mL/hr over 30 Minutes Intravenous Every 12 hours 01/18/20 0505 01/18/20 1012   01/25/2020 2130  vancomycin (VANCOCIN) IVPB 1000 mg/200 mL premix  Status:  Discontinued     1,000 mg 200 mL/hr over 60 Minutes Intravenous  Once 01/20/2020 2116 01/30/2020 2128   01/26/2020 2130  ceFEPIme (MAXIPIME) 2 g in sodium chloride 0.9 % 100 mL IVPB     2 g 200 mL/hr over 30 Minutes Intravenous  Once 01/20/2020 2116 01/12/2020 2219   01/22/2020 2130  vancomycin (VANCOREADY) IVPB 2000 mg/400 mL     2,000 mg 200 mL/hr over 120 Minutes Intravenous  Once 01/18/2020 2128 01/18/20 0038      Subjective: More talkative today A&Ox3 Does not complain about much in general - generally answers positively to questions Says he likes it better  off bipap  01/27/20: Pt is alert,awake and cooperative o2 sats are in 80's and d/w RT to start Bipap or high flow [place] , pt is also been hypotensive and has guarded prognosis due to need for continuous ventilatory  Support with bipap . Pt does not report any other complaints.   01/29/20 Pt seen today he is minimally arousable and is tachypneic. Dr.Aleskerov has met with family and plan is for hospice care Pt has not been able to tolerate bipap therapy. His vbg today shows pco2 of 109 and po2 of 66, sodium at 150 and mild hypokalemia. Wbc count has increased to 11.2, h/h is stable.   Objective: Vitals:   01/29/20 0900 01/29/20 1000 01/29/20 1100 01/29/20 1200  BP: 101/81 97/81 119/85 123/72  Pulse: 95 76 68 86  Resp: (!) 24 (!) 29 (!) 36 17  Temp:    98.6 F (37 C)  TempSrc:    Axillary  SpO2: 94% 95% 98% 100%  Weight:      Height:        Intake/Output Summary (Last 24 hours)  at 01/29/2020 1722 Last data filed at 01/29/2020 1500 Gross per 24 hour  Intake 180 ml  Output 1900 ml  Net -1720 ml   Filed Weights   01/27/20 0500 01/28/20 0145 01/29/20 0449  Weight: 109.9 kg 105.1 kg 103.9 kg   Examination: Blood pressure 123/72, pulse 86, temperature 98.6 F (37 C), temperature source Axillary, resp. rate 17, height 5' 11"  (1.803 m), weight 103.9 kg, SpO2 100 %. General: No acute distress. Cardiovascular: irregular rate and rhythm. Lungs: bl wheezing on inspiration and expiration.  Abdomen: Soft, nontender, nondistended  Neurological: somnolent Skin: Warm and dry. No rashes or lesions. Extremities: Bilateral lower extremity edema.  Data Reviewed:  CBC: Recent Labs  Lab 01/24/20 0529 01/24/20 0529 01/25/20 0440 01/26/20 0449 01/27/20 0628 01/28/20 0425 01/29/20 0738  WBC 13.6*   < > 10.1 10.4 11.5* 9.2 11.2*  NEUTROABS 11.7*  --  7.9* 8.2* 9.1*  --  8.9*  HGB 11.6*   < > 10.3* 10.0* 10.2* 9.7* 10.3*  HCT 36.8*   < > 33.5* 31.9* 32.7* 31.0* 34.1*  MCV 96.1   < > 96.8 96.4  95.6 96.0 99.4  PLT 208   < > 161 162 169 153 181   < > = values in this interval not displayed.   Basic Metabolic Panel: Recent Labs  Lab 01/25/20 0440 01/26/20 0449 01/27/20 0628 01/28/20 0425 01/29/20 0434  NA 144 146* 143 146* 150*  K 3.4* 3.6 3.0* 3.6 3.4*  CL 88* 88* 85* 84* 85*  CO2 45* 50* 46* 49* >50*  GLUCOSE 101* 128* 132* 126* 132*  BUN 13 14 14 17 18   CREATININE 0.40* 0.38* 0.42* 0.40* 0.43*  CALCIUM 8.5* 8.4* 8.6* 8.6* 8.9  MG 1.8 1.6* 1.8 1.7 1.8  PHOS 2.1* 2.5 2.9 2.2* 3.1   GFR: Estimated Creatinine Clearance: 103.9 mL/min (A) (by C-G formula based on SCr of 0.43 mg/dL (L)). Liver Function Tests: Recent Labs  Lab 01/23/20 0543 01/24/20 0529 01/25/20 0440 01/26/20 0449 01/27/20 0628  AST 14* 16 13* 15 15  ALT 10 12 11 12 11   ALKPHOS 49 58 50 50 56  BILITOT 0.6 0.8 0.8 0.9 1.0  PROT 5.5* 6.2* 6.0* 6.0* 6.8  ALBUMIN 2.2* 2.4* 2.4* 2.2* 2.7*   CBG: Recent Labs  Lab 01/24/20 1258  GLUCAP 187*   No results found for this or any previous visit (from the past 240 hour(s)).   Radiology Studies: No results found.  Scheduled Meds: . apixaban  5 mg Oral BID  . atorvastatin  20 mg Oral QHS  . Chlorhexidine Gluconate Cloth  6 each Topical Daily  . diltiazem  60 mg Oral Q6H  . divalproex  375 mg Oral BID  . feeding supplement (ENSURE ENLIVE)  237 mL Oral TID BM  . feeding supplement (PRO-STAT SUGAR FREE 64)  30 mL Oral BID WC  . multivitamin with minerals  1 tablet Oral Daily  . [START ON 01/30/2020] pantoprazole  40 mg Oral Daily  . polyvinyl alcohol  1 drop Both Eyes QID  . potassium chloride  40 mEq Oral BID  . senna-docusate  1 tablet Oral BID   Continuous Infusions: . sodium chloride 10 mL/hr at 01/24/20 1056  . albumin human 60 mL/hr at 01/29/20 1500     LOS: 11 days    Time spent: over 20 min Para Skeans, MD [facility] To contact the attending provider between 7A-7P or the covering provider during after hours 7P-7A, please  log  into the web site [email or web address] and access using universal [place] password for that web site. If you do not have the password, please call the hospital operator.  01/29/2020, 5:22 PM

## 2020-01-29 NOTE — Progress Notes (Signed)
SLP Cancellation Note  Patient Details Name: Dwayne Bridges MRN: [id number] DOB: Apr 06, 1951   Cancelled treatment:       Reason Eval/Treat Not Completed: Fatigue/lethargy limiting ability to participate;Medical issues which prohibited therapy(chart reviewed; consulted NSG re: pt's status). Per NSG, pt is not taking po's today. He has swallowed Pills in Puree w/ NSG. Noted chart notes; Hospice to f/u d/t overall prognosis of chronic medical issues.  ST services will be available if any further needs re: swallowing. Will f/u next 2-3 days. Recommend frequent oral care for hygiene and stimulation of swallowing.    Jerilynn Som, MS, CCC-SLP Emersen Carroll 01/29/2020, 2:39 PM

## 2020-01-29 NOTE — Progress Notes (Signed)
Daily Progress Note   Patient Name: Dwayne Bridges       Date: 01/29/2020 DOB: [birth year]  Age: 69 y.o. MRN#: [id number] Attending Physician: Gertha Calkin, MD Primary Care Physician: Patient, [name] Per Admit Date: 01/27/20  Reason for Consultation/Follow-up: Establishing goals of care  Subjective: Patient much less responsive today - sister and niece at bedside.   Length of Stay: 11  Current Medications: Scheduled Meds:  . apixaban  5 mg Oral BID  . atorvastatin  20 mg Oral QHS  . Chlorhexidine Gluconate Cloth  6 each Topical Daily  . diltiazem  60 mg Oral Q6H  . divalproex  375 mg Oral BID  . feeding supplement (ENSURE ENLIVE)  237 mL Oral TID BM  . feeding supplement (PRO-STAT SUGAR FREE 64)  30 mL Oral BID WC  . multivitamin with minerals  1 tablet Oral Daily  . [START ON 01/30/2020] pantoprazole  40 mg Oral Daily  . polyvinyl alcohol  1 drop Both Eyes QID  . potassium chloride  40 mEq Oral BID  . senna-docusate  1 tablet Oral BID    Continuous Infusions: . sodium chloride 10 mL/hr at 01/24/20 1056  . albumin human 60 mL/hr at 01/29/20 1500    PRN Meds: acetaminophen **OR** acetaminophen, HYDROcodone-acetaminophen, ipratropium-albuterol, metoprolol tartrate, ondansetron **OR** ondansetron (ZOFRAN) IV  Physical Exam Constitutional:      General: He is not in acute distress.    Appearance: He is ill-appearing.     Comments: Much less responsive today - flutters eyes to verbal stimulation - does not speak or follow commands  Cardiovascular:     Rate and Rhythm: Normal rate.  Pulmonary:     Effort: Pulmonary effort is normal. No respiratory distress.  Abdominal:     Palpations: Abdomen is soft.  Musculoskeletal:     Right lower leg: No edema.     Left lower leg: No edema.    Skin:    General: Skin is warm and dry.  Neurological:     Mental Status: He is disoriented.             Vital Signs: BP 123/72 (BP Location: Right Arm)   Pulse 86   Temp 98.6 F (37 C) (Axillary)   Resp 17   Ht 5\' 11"  (1.803 m)   Wt 103.9 kg   SpO2 100%   BMI 31.95 kg/m  SpO2: SpO2: 100 % O2 Device: O2 Device: High Flow Nasal Cannula O2 Flow Rate: O2 Flow Rate (L/min): 5 L/min  Intake/output summary:   Intake/Output Summary (Last 24 hours) at 01/29/2020 1726 Last data filed at 01/29/2020 1500 Gross per 24 hour  Intake 180 ml  Output 1900 ml  Net -1720 ml   LBM: Last BM Date: 01/26/20 Baseline Weight: Weight: 99.6 kg Most recent weight: Weight: 103.9 kg       Palliative Assessment/Data: PPS 40%    Flowsheet Rows     Most Recent Value  Intake Tab  Referral Department  Critical care  Unit at Time of Referral  ICU  Palliative Care Primary Diagnosis  Pulmonary  Date Notified  01/27/20  Palliative Care Type  New Palliative care  Reason for referral  Clarify Goals of Care  Date of Admission  02-04-20  Date first seen by Palliative Care  01/28/20  # of days Palliative referral response time  1 Day(s)  # of days IP prior to Palliative referral  10  Clinical Assessment  Palliative Performance Scale Score  40%  Psychosocial & Spiritual Assessment  Palliative Care Outcomes  Patient/Family meeting held?  Yes  Who was at the meeting?  niece  Palliative Care Outcomes  Clarified goals of care, Counseled regarding hospice      Patient Active Problem List   Diagnosis Date Noted  . Acute on chronic respiratory failure (HCC)   . Palliative care by specialist   . Goals of care, counseling/discussion   . Hypoxia   . AKI (acute kidney injury) (HCC) 01/18/2020  . Dehydration 01/18/2020  . Hypotension 01/18/2020  . Hypokalemia 01/18/2020  . Abdominal distension 01/18/2020  . Hyponatremia 01/18/2020  . Septic shock (HCC) 01/18/2020  . Atrial fibrillation, chronic  (HCC) 01/07/2020  . Acute metabolic encephalopathy 01/07/2020  . Bipolar disorder, unspecified (HCC)   . Elevated troponin   . Acute respiratory failure with hypoxia (HCC) 06/30/2019  . Atrial fibrillation with RVR (HCC) 03/28/2019  . Acute on chronic diastolic CHF (congestive heart failure) (HCC) 03/27/2019  . Acute on chronic respiratory failure with hypoxia (HCC) 03/27/2019  . COVID-19 virus infection 03/27/2019  . HTN (hypertension) 03/27/2019  . BPH (benign prostatic hyperplasia) 03/27/2019  . HLD (hyperlipidemia) 03/27/2019  . CHF exacerbation (HCC) 03/27/2019    Palliative Care Assessment & Plan   HPI: 69 y.o. male  with past medical history of chronic OHS, HTN, HLD, GERD, anxiety, dCHF, bipolar disorder, COVID infection in MAY 2020, a fib on eliquis, and malnutrition admitted on 02/04/2020 with hypotension and decreased PO intake. Patient comes from [facility]. He was recently admitted for CHF exacerbation and d/c'd 3/2. Patient has been treated with broad spectrum antibiotics d/t possible sepsis. Patient was also treated with bipap - however, when patient is placed on bipap he becomes very lethargic. PMT consulted for GOC.  Assessment: Follow up today with niece Youth worker) and sister today in person. Patient and labs look worse today.   We reviewed our discussion from yesterday and reviewed clinical condition today - family expresses understanding of poor prognosis and limited treatment options.   Thorough discussion of options to move forward with - continuing aggressive care vs hospice at SNF vs hospice facility. Family expresses understanding that patient is no longer benefiting from continued aggressive care.   They were given time to consider options and eventually called back with a decision to return to [facility] where patient has lived for 6 years - his caregivers would like the chance to say goodbye to him. They would like hospice to follow the patient at [facility] and  help ensure he is comfortable as he nears end of life.   Recommendations/Plan:  Discharge to [facility] with hospice to follow - TOC consult placed, hospice liaison notified  Patient has declined rapidly in the past 2 days - evaluate for stability to transfer in ambulance to [facility], if too unstable transition to full comfort measures in the hospital  Code Status:  DNR  Prognosis:   < 2 weeks  Discharge Planning:  Skilled Nursing Facility with Hospice  Care plan was discussed with Dr. Allena Katz, patient's sister and niece  (niece is HCPOA)  Thank you for allowing the Palliative Medicine Team to assist in the care of this  patient.   Total Time 45 minutes Prolonged Time Billed  no       Greater than 50%  of this time was spent counseling and coordinating care related to the above assessment and plan.  Juel Burrow, DNP, [facility] Palliative Medicine Team Team Phone # [phone]  Pager [phone]

## 2020-01-29 NOTE — Progress Notes (Signed)
Name: Dwayne Bridges MRN: [id number] DOB: 01-May-1951    ADMISSION DATE:  02/08/2020 CONSULTATION DATE: 01/18/2020  REFERRING MD : Rufina Falco, NP    BRIEF PATIENT DESCRIPTION:  69 yo male admitted with hypotension likely secondary to hypovolemic shock secondary to dehydration/overdiuresis and possible sepsis with possible bilateral lower lobe pneumonia vs. partial compressive atelectasis   SIGNIFICANT EVENTS/STUDIES:  03/7: Pt admitted to the stepdown unit with hypotension 03/7: CTA Chest revealed no CT evidence of central pulmonary artery embolus. Moderate cardiomegaly with findings of right heart dysfunction. Correlation with echocardiogram recommended. Small bilateral pleural effusions with bilateral lower lobe partial compressive atelectasis versus pneumonia. Clinical correlation is recommended. Aortic Atherosclerosis  03/8: Pts status changed to ICU due to continue hypotension despite aggressive fluid resuscitation   3/17- patient continues to require BIPAP and has intermittent episodes of obtunded state.  Overall with poor prognosis due to chronic OHS/OSA with hypoxemia, will obtain PALS consult for [facility] today.  3/18 - mental status has improved, diuresis has been adequate, pulmonary edema has decreased 3/19- patient continues to have severe hypercapnic respiratory failure and is unable to tolerate BIPAP with congenital cognitive impairment.     Review of Systems: 10 point system negative   Other:  All other systems negative  VITAL SIGNS: Temp:  [98.2 F (36.8 C)-98.6 F (37 C)] 98.6 F (37 C) (03/19 0800) Pulse Rate:  [48-103] 65 (03/19 0800) Resp:  [22-41] 22 (03/19 0800) BP: (114-178)/(69-110) 125/84 (03/19 0800) SpO2:  [89 %-100 %] 96 % (03/19 0800) FiO2 (%):  [40 %] 40 % (03/18 1648) Weight:  [103.9 kg] 103.9 kg (03/19 0449)  Physical Examination:   General Appearance: No distress  Neuro:without focal findings,   HEENT: PERRLA, EOM intact.   Pulmonary:  +crackles bilaterally  CardiovascularNormal S1,S2.  No m/r/g.   Abdomen: Benign, Soft, non-tender. Renal:  No costovertebral tenderness  GU:  Not performed at this time. Endoc: No evident thyromegaly Skin:   warm, no rashes, no ecchymosis  Extremities:2+edema  PSYCHIATRIC: Mood, affect within normal limits.      Recent Labs  Lab 01/27/20 0628 01/28/20 0425 01/29/20 0434  NA 143 146* 150*  K 3.0* 3.6 3.4*  CL 85* 84* 85*  CO2 46* 49* >50*  BUN 14 17 18   CREATININE 0.42* 0.40* 0.43*  GLUCOSE 132* 126* 132*   Recent Labs  Lab 01/27/20 0628 01/28/20 0425 01/29/20 0738  HGB 10.2* 9.7* 10.3*  HCT 32.7* 31.0* 34.1*  WBC 11.5* 9.2 11.2*  PLT 169 153 181   No results found.    ASSESSMENT / PLAN:   Severe metabolic alkalosis -due to chronic CO2 retention with concomitant volume contraction post diuresis -[place] OHS patient with OSA overalp and chronic diastilic CHF -repeat VBG with normal ph  despite relatively elevated CO2 - will continue BIPAP QHS and 1/2 of daytime while diuresing -will continue diuresis for pulmonary edema note on CT chest -repeat CXR as above   Chronic diastolic CHF  - continue diuresis - will start demadex 20 po bid  - monitor mentation   -creatinine is within reference range  - BIPAP overnight was not well tolerated by patient and he became more obtunded    Moderate protein calorie malnutrition   - albumin <2.4  - bitemporal wasting and muscular weakness  -RD consult   OSA     Use BIPAP for now RT for settings  Atrial fibrillation  - on cardizem -on eliquis   Critical care provider statement:  Critical care time (minutes):  33   Critical care time was exclusive of:  Separately billable procedures and  treating other patients   Critical care was necessary to treat or prevent imminent or  life-threatening deterioration of the following conditions:  metabilic alkalosisk, hypotension, confusion , moderate protein calorie  malnutrionin, multiple comorbid contieions   Critical care was time spent personally by me on the following  activities:  Development of treatment plan with patient or surrogate,  discussions with consultants, evaluation of patient's response to  treatment, examination of patient, obtaining history from patient or  surrogate, ordering and performing treatments and interventions, ordering  and review of laboratory studies and re-evaluation of patient's condition   I assumed direction of critical care for this patient from another  provider in my specialty: no       Vida Rigger, M.D.  Pulmonary & Critical Care Medicine  [facility]

## 2020-01-29 NOTE — Progress Notes (Addendum)
New referral for [facility] hospice services at [facility] post discharge received from Palliative NP Harvest Dark. Patient information sent to referral.  Thank you for this referral.  Please contact [facility] hospice at [phone] if patient discharges over the weekend and please fax the discharge summary to [phone] Dayna Barker BSN, RN, [facility] Liaison [employer] [phone]

## 2020-01-29 NOTE — Progress Notes (Signed)
Name: Dwayne Bridges MRN: [id number] DOB: Feb 18, 1951    ADMISSION DATE:  02/01/2020 CONSULTATION DATE: 01/18/2020  REFERRING MD : Rufina Falco, NP    BRIEF PATIENT DESCRIPTION:  69 yo male admitted with hypotension likely secondary to hypovolemic shock secondary to dehydration/overdiuresis and possible sepsis with possible bilateral lower lobe pneumonia vs. partial compressive atelectasis   SIGNIFICANT EVENTS/STUDIES:  03/7: Pt admitted to the stepdown unit with hypotension 03/7: CTA Chest revealed no CT evidence of central pulmonary artery embolus. Moderate cardiomegaly with findings of right heart dysfunction. Correlation with echocardiogram recommended. Small bilateral pleural effusions with bilateral lower lobe partial compressive atelectasis versus pneumonia. Clinical correlation is recommended. Aortic Atherosclerosis  03/8: Pts status changed to ICU due to continue hypotension despite aggressive fluid resuscitation  3/17- patient continues to require BIPAP and has intermittent episodes of obtunded state.  Overall with poor prognosis due to chronic OHS/OSA with hypoxemia, will obtain PALS consult for [place] today.  3/18 - mental status has improved, diuresis has been adequate, pulmonary edema has decreased 3/19- patient continues to struggle with hypoxemia/hypercapnia and confusion, discussed case with Palliative service today   Review of Systems: 10 point system negative   Other:  All other systems negative  VITAL SIGNS: Temp:  [98.2 F (36.8 C)-98.6 F (37 C)] 98.6 F (37 C) (03/19 1200) Pulse Rate:  [65-103] 86 (03/19 1200) Resp:  [17-41] 17 (03/19 1200) BP: (97-178)/(72-110) 123/72 (03/19 1200) SpO2:  [89 %-100 %] 100 % (03/19 1200) FiO2 (%):  [40 %] 40 % (03/18 1648) Weight:  [103.9 kg] 103.9 kg (03/19 0449)  Physical Examination:   General Appearance: No distress  Neuro:without focal findings,   HEENT: PERRLA, EOM intact.   Pulmonary: +crackles bilaterally    CardiovascularNormal S1,S2.  No m/r/g.   Abdomen: Benign, Soft, non-tender. Renal:  No costovertebral tenderness  GU:  Not performed at this time. Endoc: No evident thyromegaly Skin:   warm, no rashes, no ecchymosis  Extremities:2+edema  PSYCHIATRIC: Mood, affect within normal limits.      Recent Labs  Lab 01/27/20 0628 01/28/20 0425 01/29/20 0434  NA 143 146* 150*  K 3.0* 3.6 3.4*  CL 85* 84* 85*  CO2 46* 49* >50*  BUN 14 17 18   CREATININE 0.42* 0.40* 0.43*  GLUCOSE 132* 126* 132*   Recent Labs  Lab 01/27/20 0628 01/28/20 0425 01/29/20 0738  HGB 10.2* 9.7* 10.3*  HCT 32.7* 31.0* 34.1*  WBC 11.5* 9.2 11.2*  PLT 169 153 181   No results found.    ASSESSMENT / PLAN:   Severe metabolic alkalosis -due to chronic CO2 retention with concomitant volume contraction post diuresis -[place] OHS patient with OSA overalp and chronic diastilic CHF -repeat VBG with normal ph  despite relatively elevated CO2 - will continue BIPAP QHS and 1/2 of daytime while diuresing -will continue diuresis for pulmonary edema note on CT chest -repeat CXR as above   Chronic diastolic CHF  - continue diuresis - will start demadex 20 po bid  - monitor mentation   -creatinine is within reference range  - BIPAP overnight was not well tolerated by patient and he became more obtunded    Moderate protein calorie malnutrition   - albumin <2.4  - bitemporal wasting and muscular weakness  -RD consult   OSA     Use BIPAP for now RT for settings  Atrial fibrillation  - on cardizem -on eliquis   Critical care provider statement:    Critical care time (minutes):  33   Critical care time was exclusive of:  Separately billable procedures and  treating other patients   Critical care was necessary to treat or prevent imminent or  life-threatening deterioration of the following conditions:  metabilic alkalosisk, hypotension, confusion , moderate protein calorie malnutrionin, multiple  comorbid contieions   Critical care was time spent personally by me on the following  activities:  Development of treatment plan with patient or surrogate,  discussions with consultants, evaluation of patient's response to  treatment, examination of patient, obtaining history from patient or  surrogate, ordering and performing treatments and interventions, ordering  and review of laboratory studies and re-evaluation of patient's condition   I assumed direction of critical care for this patient from another  provider in my specialty: no       Vida Rigger, M.D.  Pulmonary & Critical Care Medicine  [facility]

## 2020-01-29 NOTE — Progress Notes (Signed)
Pharmacy Electrolyte Monitoring Consult:  Pharmacy consulted to assist in monitoring and replacing electrolytes in this 69 y.o. male admitted on 01/13/2020 with hypoxic and hypercapnic respiratory failure from bilateral pneumonia.   Labs:  Sodium (mmol/L)  Date Value  01/29/2020 150 (H)  12/05/2013 142   Potassium (mmol/L)  Date Value  01/29/2020 3.4 (L)  12/05/2013 3.1 (L)   Magnesium (mg/dL)  Date Value  21/19/4174 1.8  12/05/2013 1.8   Phosphorus (mg/dL)  Date Value  06/25/4817 3.1   Calcium (mg/dL)  Date Value  56/31/4970 8.9   Calcium, Total (mg/dL)  Date Value  26/37/8588 8.4 (L)   Albumin (g/dL)  Date Value  50/27/7412 2.7 (L)  12/01/2013 2.7 (L)   Corrected Ca: 9.94 mg/dL  Assessment/Plan: 69 yo male admitted with hypotension likely secondary to hypovolemic shock secondary to dehydration/overdiuresis and possible sepsis with possible bilateral lower lobe pneumonia vs. partial compressive atelectasis    magnesium sulfate 2 grams x 1  oral KCl 40 mEq x 2  ffollow up electrolytes with morning labs.  Dwayne Bridges, PharmD 01/29/2020 8:38 AM

## 2020-01-30 ENCOUNTER — Inpatient Hospital Stay: Payer: No Typology Code available for payment source

## 2020-01-30 LAB — CBC WITH DIFFERENTIAL/PLATELET
Abs Immature Granulocytes: 0.24 10*3/uL — ABNORMAL HIGH (ref 0.00–0.07)
Basophils Absolute: 0.1 10*3/uL (ref 0.0–0.1)
Basophils Relative: 1 %
Eosinophils Absolute: 0 10*3/uL (ref 0.0–0.5)
Eosinophils Relative: 0 %
HCT: 33.2 % — ABNORMAL LOW (ref 39.0–52.0)
Hemoglobin: 9.8 g/dL — ABNORMAL LOW (ref 13.0–17.0)
Immature Granulocytes: 2 %
Lymphocytes Relative: 11 %
Lymphs Abs: 1 10*3/uL (ref 0.7–4.0)
MCH: 29.8 pg (ref 26.0–34.0)
MCHC: 29.5 g/dL — ABNORMAL LOW (ref 30.0–36.0)
MCV: 100.9 fL — ABNORMAL HIGH (ref 80.0–100.0)
Monocytes Absolute: 0.6 10*3/uL (ref 0.1–1.0)
Monocytes Relative: 6 %
Neutro Abs: 7.9 10*3/uL — ABNORMAL HIGH (ref 1.7–7.7)
Neutrophils Relative %: 80 %
Platelets: 169 10*3/uL (ref 150–400)
RBC: 3.29 MIL/uL — ABNORMAL LOW (ref 4.22–5.81)
RDW: 16 % — ABNORMAL HIGH (ref 11.5–15.5)
WBC: 9.9 10*3/uL (ref 4.0–10.5)
nRBC: 0.6 % — ABNORMAL HIGH (ref 0.0–0.2)

## 2020-01-30 LAB — BASIC METABOLIC PANEL
Anion gap: 14 (ref 5–15)
BUN: 21 mg/dL (ref 8–23)
CO2: 49 mmol/L — ABNORMAL HIGH (ref 22–32)
Calcium: 9.3 mg/dL (ref 8.9–10.3)
Chloride: 89 mmol/L — ABNORMAL LOW (ref 98–111)
Creatinine, Ser: 0.6 mg/dL — ABNORMAL LOW (ref 0.61–1.24)
GFR calc Af Amer: >60 mL/min (ref >60)
GFR calc non Af Amer: >60 mL/min (ref >60)
Glucose, Bld: 134 mg/dL — ABNORMAL HIGH (ref 70–99)
Potassium: 4.4 mmol/L (ref 3.5–5.1)
Sodium: 152 mmol/L — ABNORMAL HIGH (ref 135–145)

## 2020-01-30 LAB — MAGNESIUM: Magnesium: 2.1 mg/dL (ref 1.7–2.4)

## 2020-01-30 MED ORDER — DEXTROSE-NACL 5-0.45 % IV SOLN: INTRAVENOUS | Status: DC

## 2020-01-30 MED ORDER — MORPHINE SULFATE (PF) 2 MG/ML IV SOLN
INTRAVENOUS | Status: AC
  Administered 2020-01-30: 2 mg via INTRAVENOUS
  Filled 2020-01-30: qty 1

## 2020-01-30 MED ORDER — MORPHINE SULFATE (PF) 2 MG/ML IV SOLN: 2.0000 mg | Freq: Once | INTRAVENOUS | Status: AC

## 2020-01-30 MED ORDER — HYDRALAZINE HCL 20 MG/ML IJ SOLN: 10.0000 mg | Freq: Three times a day (TID) | INTRAMUSCULAR | Status: DC | PRN

## 2020-01-30 MED ORDER — METOPROLOL TARTRATE 5 MG/5ML IV SOLN
2.5000 mg | Freq: Four times a day (QID) | INTRAVENOUS | Status: DC | PRN
  Administered 2020-01-30 (×2): 2.5 mg via INTRAVENOUS
  Filled 2020-01-30: qty 5

## 2020-01-30 MED ORDER — DEXTROSE-NACL 5-0.45 % IV SOLN: INTRAVENOUS | Status: AC

## 2020-01-30 MED ORDER — FREE WATER
100.0000 mL | Freq: Four times a day (QID) | Status: DC
  Administered 2020-01-30: 100 mL

## 2020-01-30 MED ORDER — FUROSEMIDE 10 MG/ML IJ SOLN
20.0000 mg | Freq: Once | INTRAMUSCULAR | Status: AC
  Administered 2020-01-30: 20 mg via INTRAVENOUS

## 2020-01-30 NOTE — Progress Notes (Signed)
PROGRESS NOTE    Dwayne Bridges  TDS:[id number] DOB: 07/31/1951 DOA: 01/11/2020 PCP: Patient, [name] Per   Brief Narrative:  69 yo with hx of HTN, HLD, GERD, anxiety, dHF, bipolar disorder, COVID infectioon in May 2020, atrial fibrillation on eliquis who presented with hypotension and decreased PO intake.  He was recently discharged on 3/2, he'd been treated for HF exacerbation at that admission and was diuresed.  He represented with hypotension and concern for possible sepsis from pneumonia.  He was admitted to the ICU due to persistent hypotension despite aggressive IVF.  He's been transferred back to the hospitalist service on 3/10.  He's been treated with IV antibiotics and gradually improved, though he had worsening peripheral edema.  He was restarted on diuresis.  He'd previously been discharged with nightly bipap and this was restarted as well.  He currently has severe metabolic acidosis which is complicating diuresis.  Bipap has been discontinued due to concerns for aspiration.  Assessment & Plan:   Acute Hypoxic Respiratory Failure 2/2 Pneumonia and Heart Failure: Currently requiring Bipap for oxygenation. S/p broad spectrum abx x7 days Due to worsening CXR findings, will continue abx, narrow to unasyn and follow (? Aspiration) SLP eval pending -> mechanical soft with thin liquid (see note) CXR 3/14 am with improved edema CXR 3/15 with findings concerning for HF vs atypical infection - increasing opacification in R hemithorax 2/2 increasing volume of pleural fluid and atelectatic change vs developing infection/consolidation CXR 3/16 with slight interval worsening of bilateral interstitial and airspace opacities concerning for edema (vs ARDS or pneumonia).   HFpEF Exacerbation: echo from 2/26 with EF 60-65%, indeterminate LV diastolic parameters (see report).  Holding diuresis at this time due to hypotension at presentation. Repeat echo with findings of R heart dysfunction on CT scan -  repeat echo with mildly elevate pulmonary artery systolic pressure Follow I/O, daily weights  Wt Readings from Last 3 Encounters:  01/30/20 101.8 kg  01/12/20 99.6 kg  07/02/19 262.0 kg   Metabolic Alkalosis  Chronic Hypercarbic Respiratory Failure:  Pulmonology following pt closely for his RF.  Pt has osa and ohs with hypoxia and has poor prognosis.  Hypernatremia: starting to get dehydrated - hold lasix  Hypotension secondary to Septic Shock vs Overdiuresis  Bilateral Lower Lobe Pneumonia:  Continue cefepime (3/7 - present), flagyl (3/8 - present) - plan for 7 days (adding abx as noted above) Blood cx NGTD, urine cx negative I/O, daily weights.  IS, flutter valve. SLP eval -> recommending mechanical soft  Hypokalemia  Hypophosphatemia: replaced and follow  HTN: BP meds on hold.  HLD: Continue Lipitor.  Atrial Fibrillation: Continue Eliquis.  Continue diltiazem and prn metop.  Bipolar Disorder: Continue depakote  ? cognitive deficit: A&Ox3, but niece suspects he may have undiagnosed autism.  Delirium precautions. Of note, he does not complain and tends to answer questions positively (has never complained for me).  DVT prophylaxis: eliquis  Code Status: DNR Family Communication: niece 3/15 Disposition Plan:  . Patient came from: SNF            . Anticipated d/c place: SNF . Barriers to d/c OR conditions which need to be met to effect a safe d/c: pending improvement in oxygenation, pt is requiring bipap for continued support.   Consultants:   Critical Care  Procedures:  Echo IMPRESSIONS    1. Left ventricular ejection fraction, by estimation, is 60 to 65%. The  left ventricle has normal function. The left ventricle has no  regional  wall motion abnormalities.   2. Right ventricular systolic function is normal. The right ventricular  size is normal. There is mildly elevated pulmonary artery systolic  pressure.   3. The mitral valve is normal in structure. Mild  mitral valve  regurgitation. No evidence of mitral stenosis.   4. The aortic valve is normal in structure. Aortic valve regurgitation is  not visualized. No aortic stenosis is present.   5. The inferior vena cava is normal in size with greater than 50%  respiratory variability, suggesting right atrial pressure of 3 mmHg.   Antimicrobials: Anti-infectives (From admission, onward)   Start     Dose/Rate Route Frequency Ordered Stop   01/25/20 1800  Ampicillin-Sulbactam (UNASYN) 3 g in sodium chloride 0.9 % 100 mL IVPB  Status:  Discontinued     3 g 200 mL/hr over 30 Minutes Intravenous Every 6 hours 01/25/20 1732 01/26/20 1033   01/18/20 1400  ceFEPIme (MAXIPIME) 2 g in sodium chloride 0.9 % 100 mL IVPB     2 g 200 mL/hr over 30 Minutes Intravenous Every 8 hours 01/18/20 1012 01/25/20 1230   01/18/20 1015  metroNIDAZOLE (FLAGYL) IVPB 500 mg     500 mg 100 mL/hr over 60 Minutes Intravenous Every 8 hours 01/18/20 1012 01/25/20 1014   01/18/20 0900  ceFEPIme (MAXIPIME) 2 g in sodium chloride 0.9 % 100 mL IVPB  Status:  Discontinued     2 g 200 mL/hr over 30 Minutes Intravenous Every 12 hours 01/18/20 0505 01/18/20 1012   01/14/2020 2130  vancomycin (VANCOCIN) IVPB 1000 mg/200 mL premix  Status:  Discontinued     1,000 mg 200 mL/hr over 60 Minutes Intravenous  Once 02/01/2020 2116 01/29/2020 2128   02/01/2020 2130  ceFEPIme (MAXIPIME) 2 g in sodium chloride 0.9 % 100 mL IVPB     2 g 200 mL/hr over 30 Minutes Intravenous  Once 02/04/2020 2116 01/29/2020 2219   01/14/2020 2130  vancomycin (VANCOREADY) IVPB 2000 mg/400 mL     2,000 mg 200 mL/hr over 120 Minutes Intravenous  Once 01/13/2020 2128 01/18/20 0038     Subjective: More talkative today A&Ox3 Does not complain about much in general - generally answers positively to questions Says he likes it better off bipap  01/27/20: Pt is alert,awake and cooperative o2 sats are in 80's and d/w RT to start Bipap or high flow [place] , pt is also been hypotensive  and has guarded prognosis due to need for continuous ventilatory  Support with bipap . Pt does not report any other complaints.   01/29/20 Pt seen today he is minimally arousable and is tachypneic. Dr.Aleskerov has met with family and plan is for hospice care Pt has not been able to tolerate bipap therapy. His vbg today shows pco2 of 109 and po2 of 66, sodium at 150 and mild hypokalemia. Wbc count has increased to 11.2, h/h is stable.   01/30/20 Pt seen he is somnolent and responds minimally. Pt is started on D5/1/2 ns for his hypernatremia and dehydration. D/w niece ms. Mary about his case and guarded prognosis.  Objective: Vitals:   01/30/20 0600 01/30/20 0700 01/30/20 0800 01/30/20 0900  BP: (!) 156/98 (!) 151/95 (!) 153/88   Pulse: (!) 107 (!) 106 (!) 104 83  Resp: 20 (!) 21 16 (!) 30  Temp:  98.2 F (36.8 C) 98.4 F (36.9 C)   TempSrc:  Axillary Oral   SpO2: 97% 98% 96% 96%  Weight:  Height:        Intake/Output Summary (Last 24 hours) at 01/30/2020 1620 Last data filed at 01/30/2020 1018 Gross per 24 hour  Intake 60 ml  Output 1450 ml  Net -1390 ml   Filed Weights   01/28/20 0145 01/29/20 0449 01/30/20 0500  Weight: 105.1 kg 103.9 kg 101.8 kg   Examination: Blood pressure (!) 153/88, pulse 83, temperature 98.4 F (36.9 C), temperature source Oral, resp. rate (!) 30, height 5' 11"  (1.803 m), weight 101.8 kg, SpO2 96 %. General: No acute distress. Cardiovascular: irregular rate and rhythm. Lungs: bl wheezing on inspiration and expiration.  Abdomen: Soft, nontender, nondistended  Neurological: somnolent Skin: Warm and dry. No rashes or lesions. Extremities: Bilateral lower extremity edema.  Data Reviewed:  CBC: Recent Labs  Lab 01/25/20 0440 01/25/20 0440 01/26/20 0449 01/27/20 0628 01/28/20 0425 01/29/20 0738 01/30/20 0359  WBC 10.1   < > 10.4 11.5* 9.2 11.2* 9.9  NEUTROABS 7.9*  --  8.2* 9.1*  --  8.9* 7.9*  HGB 10.3*   < > 10.0* 10.2* 9.7* 10.3*  9.8*  HCT 33.5*   < > 31.9* 32.7* 31.0* 34.1* 33.2*  MCV 96.8   < > 96.4 95.6 96.0 99.4 100.9*  PLT 161   < > 162 169 153 181 169   < > = values in this interval not displayed.   Basic Metabolic Panel: Recent Labs  Lab 01/25/20 0440 01/25/20 0440 01/26/20 0449 01/27/20 0628 01/28/20 0425 01/29/20 0434 01/30/20 0359 01/30/20 0704  NA 144   < > 146* 143 146* 150*  --  152*  K 3.4*   < > 3.6 3.0* 3.6 3.4*  --  4.4  CL 88*   < > 88* 85* 84* 85*  --  89*  CO2 45*   < > 50* 46* 49* >50*  --  49*  GLUCOSE 101*   < > 128* 132* 126* 132*  --  134*  BUN 13   < > 14 14 17 18   --  21  CREATININE 0.40*   < > 0.38* 0.42* 0.40* 0.43*  --  0.60*  CALCIUM 8.5*   < > 8.4* 8.6* 8.6* 8.9  --  9.3  MG 1.8   < > 1.6* 1.8 1.7 1.8 2.1  --   PHOS 2.1*  --  2.5 2.9 2.2* 3.1  --   --    < > = values in this interval not displayed.   GFR: Estimated Creatinine Clearance: 102.9 mL/min (A) (by C-G formula based on SCr of 0.6 mg/dL (L)). Liver Function Tests: Recent Labs  Lab 01/24/20 0529 01/25/20 0440 01/26/20 0449 01/27/20 0628  AST 16 13* 15 15  ALT 12 11 12 11   ALKPHOS 58 50 50 56  BILITOT 0.8 0.8 0.9 1.0  PROT 6.2* 6.0* 6.0* 6.8  ALBUMIN 2.4* 2.4* 2.2* 2.7*   CBG: Recent Labs  Lab 01/24/20 1258  GLUCAP 187*   No results found for this or any previous visit (from the past 240 hour(s)).   Radiology Studies: DG Chest Port 1 View  Result Date: 01/30/2020 CLINICAL DATA:  Congestive heart failure. EXAM: PORTABLE CHEST 1 VIEW COMPARISON:  January 27, 2020. FINDINGS: Stable cardiomediastinal silhouette. Stable bilateral lung opacities are noted concerning for multifocal pneumonia with bilateral pleural effusions, right greater than left. Bony thorax is unremarkable. No pneumothorax is noted. IMPRESSION: Stable bilateral lung opacities concerning for multifocal pneumonia with bilateral pleural effusions, right greater than left. Electronically Signed  By: Marijo Conception M.D.   On: 01/30/2020  13:36    Scheduled Meds: . apixaban  5 mg Oral BID  . atorvastatin  20 mg Oral QHS  . Chlorhexidine Gluconate Cloth  6 each Topical Daily  . diltiazem  60 mg Oral Q6H  . divalproex  375 mg Oral BID  . feeding supplement (ENSURE ENLIVE)  237 mL Oral TID BM  . feeding supplement (PRO-STAT SUGAR FREE 64)  30 mL Oral BID WC  . free water  100 mL Per Tube Q6H  . multivitamin with minerals  1 tablet Oral Daily  . pantoprazole  40 mg Oral Daily  . polyvinyl alcohol  1 drop Both Eyes QID  . senna-docusate  1 tablet Oral BID   Continuous Infusions: . albumin human 12.5 g (01/30/20 0954)  . dextrose 5 % and 0.45% NaCl 40 mL/hr at 01/30/20 1003    LOS: 12 days   Time spent: over 20 min Para Skeans, MD [facility] To contact the attending provider between 7A-7P or the covering provider during after hours 7P-7A, please log into the web site [email or web address] and access using universal [place] password for that web site. If you do not have the password, please call the hospital operator.  01/30/2020, 4:20 PM

## 2020-01-30 NOTE — Significant Event (Signed)
Pt developed worsening acute hypoxic respiratory failure with diffuse crackles throughout the lungs.  Placed on Bipap with FiO2 @100 %, however O2 sats remained in the 70's to low 80's.  Pt currently receiving prn duoneb and 20 mg IV lasix x1 dose. Bipap settings adjusted with O2 sats currently 81%.  Updated pts niece who is his HCPOA via telephone regarding decline in pts respiratory status and all questions were answered. Will continue to monitor and assess pt.   Kaylyn Layer, AGNP  Pulmonary/Critical Care Pager [phone] (please enter 7 digits) PCCM Consult Pager [phone] (please enter 7 digits)

## 2020-01-30 NOTE — Progress Notes (Signed)
Updated pts niece/HCPOA Kaylyn Layer via telephone regarding pts current condition: pt resting comfortably with O2 sats at 96%, however still requiring 100% FiO2.  We also discussed pts comfort, and Mrs. Jannette Spanner is agreeable with prn morphine for air hunger and/or signs of pain.  She stated she wants to ensure the pt is comfortable and pain free.  I informed Mrs. Jannette Spanner if the pt declines and/or requires frequent doses of morphine for air hunger I would contact her.  Mrs. Jannette Spanner was grateful and appreciative for the update and the discussion regarding current plan of care.  Will continue to monitor and assess pt.   Sonda Rumble, AGNP  Pulmonary/Critical Care Pager [phone] (please enter 7 digits) PCCM Consult Pager [phone] (please enter 7 digits)

## 2020-01-30 NOTE — TOC Progression Note (Signed)
Transition of Care [facility]) - Progression Note    Patient Details  Name: Dwayne Bridges MRN: [id number] Date of Birth: 12-Jun-1951  [facility]) CM/SW Contact  Collie Siad, RN Phone Number: 01/30/2020, 9:24 AM  Clinical Narrative:    Message left for Gavin Pound with [facility] to check bed status. Per Kendal Hymen with [facility], they will not be able to see patient until 2023/03/20. Per hospital MD, patient will stay another day as death could occur before 03-20-2023.  Kendal Hymen updated.    Expected Discharge Plan: Skilled Nursing Facility Barriers to Discharge: Continued Medical Work up  Expected Discharge Plan and Services Expected Discharge Plan: Skilled Nursing Facility       Living arrangements for the past 2 months: Skilled Nursing Facility                                       Social Determinants of Health (SDOH) Interventions    Readmission Risk Interventions Readmission Risk Prevention Plan 01/19/2020  Transportation Screening Complete  PCP or Specialist Appt within 3-5 Days Complete  Medication Review [employer]) Complete  Some recent data might be hidden

## 2020-01-30 NOTE — Progress Notes (Signed)
CH met w/pt.'s niece and sister @ bedside; pt. sitting up in bed w/nasal canula but not apparently responsive; family requested prayer for peace and celebration of pt.'s life.  Sister shared pt. is autistic and has been source of joy to family.  [place] prayed for pt.'s transition and for family's coping.  CH remains available as needed.    01/29/20 1500  Clinical Encounter Type  Visited With Patient and family together  Visit Type Initial;Psychological support;Spiritual support;Social support;Critical Care  Referral From Palliative care team  Consult/Referral To Chaplain  Spiritual Encounters  Spiritual Needs Emotional;Grief support;Prayer  Stress Factors  Family Stress Factors Health changes;Major life changes;Loss

## 2020-01-30 NOTE — Progress Notes (Signed)
Name: Dwayne Bridges MRN: [id number] DOB: 1951/05/28    ADMISSION DATE:  02/07/20 CONSULTATION DATE: 01/18/2020  REFERRING MD : Rufina Falco, NP    BRIEF PATIENT DESCRIPTION:  69 yo male admitted with hypotension likely secondary to hypovolemic shock secondary to dehydration/overdiuresis and possible sepsis with possible bilateral lower lobe pneumonia vs. partial compressive atelectasis   SIGNIFICANT EVENTS/STUDIES:  03/7: Pt admitted to the stepdown unit with hypotension 03/7: CTA Chest revealed no CT evidence of central pulmonary artery embolus. Moderate cardiomegaly with findings of right heart dysfunction. Correlation with echocardiogram recommended. Small bilateral pleural effusions with bilateral lower lobe partial compressive atelectasis versus pneumonia. Clinical correlation is recommended. Aortic Atherosclerosis  03/8: Pts status changed to ICU due to continue hypotension despite aggressive fluid resuscitation  3/17- patient continues to require BIPAP and has intermittent episodes of obtunded state.  Overall with poor prognosis due to chronic OHS/OSA with hypoxemia, will obtain PALS consult for [place] today.  3/18 - mental status has improved, diuresis has been adequate, pulmonary edema has decreased 3/19- patient continues to struggle with hypoxemia/hypercapnia and confusion, discussed case with Palliative service today 3/20-   Review of Systems: 10 point system negative   Other:  All other systems negative  VITAL SIGNS: Temp:  [98 F (36.7 C)-98.4 F (36.9 C)] 98.4 F (36.9 C) (03/20 0800) Pulse Rate:  [75-107] 83 (03/20 0900) Resp:  [0-37] 30 (03/20 0900) BP: (129-170)/(73-107) 153/88 (03/20 0800) SpO2:  [87 %-100 %] 96 % (03/20 0900) Weight:  [101.8 kg] 101.8 kg (03/20 0500)  Physical Examination:   General Appearance: No distress  Neuro:without focal findings,   HEENT: PERRLA, EOM intact.   Pulmonary: +crackles bilaterally  CardiovascularNormal S1,S2.  No  m/r/g.   Abdomen: Benign, Soft, non-tender. Renal:  No costovertebral tenderness  GU:  Not performed at this time. Endoc: No evident thyromegaly Skin:   warm, no rashes, no ecchymosis  Extremities:2+edema  PSYCHIATRIC: Mood, affect within normal limits.      Recent Labs  Lab 01/28/20 0425 01/29/20 0434 01/30/20 0704  NA 146* 150* 152*  K 3.6 3.4* 4.4  CL 84* 85* 89*  CO2 49* >50* 49*  BUN 17 18 21   CREATININE 0.40* 0.43* 0.60*  GLUCOSE 126* 132* 134*   Recent Labs  Lab 01/28/20 0425 01/29/20 0738 01/30/20 0359  HGB 9.7* 10.3* 9.8*  HCT 31.0* 34.1* 33.2*  WBC 9.2 11.2* 9.9  PLT 153 181 169   No results found.    ASSESSMENT / PLAN:   Severe metabolic alkalosis -due to chronic CO2 retention with concomitant volume contraction post diuresis -[place] OHS patient with OSA overalp and chronic diastilic CHF -repeat VBG with normal ph  despite relatively elevated CO2 - will continue BIPAP QHS and 1/2 of daytime while diuresing -will continue diuresis for pulmonary edema note on CT chest -repeat CXR as above  -paliative care - appreciate input  Chronic diastolic CHF  - continue diuresis - will start demadex 20 po bid  - monitor mentation   -creatinine is within reference range  - BIPAP overnight was not well tolerated by patient and he became more obtunded    Moderate protein calorie malnutrition   - albumin <2.4  - bitemporal wasting and muscular weakness  -RD consult   OSA     Use BIPAP for now RT for settings  Atrial fibrillation  - on cardizem -on eliquis   Critical care provider statement:    Critical care time (minutes):  33   Critical  care time was exclusive of:  Separately billable procedures and  treating other patients   Critical care was necessary to treat or prevent imminent or  life-threatening deterioration of the following conditions:  metabilic alkalosisk, hypotension, confusion , moderate protein calorie malnutrionin, multiple  comorbid contieions   Critical care was time spent personally by me on the following  activities:  Development of treatment plan with patient or surrogate,  discussions with consultants, evaluation of patient's response to  treatment, examination of patient, obtaining history from patient or  surrogate, ordering and performing treatments and interventions, ordering  and review of laboratory studies and re-evaluation of patient's condition   I assumed direction of critical care for this patient from another  provider in my specialty: no       Vida Rigger, M.D.  Pulmonary & Critical Care Medicine  [facility]

## 2020-01-30 NOTE — Progress Notes (Signed)
Pharmacy Electrolyte Monitoring Consult:  Pharmacy consulted to assist in monitoring and replacing electrolytes in this 69 y.o. male admitted on 02/05/2020 with hypoxic and hypercapnic respiratory failure from bilateral pneumonia.   Labs:  Sodium (mmol/L)  Date Value  01/30/2020 152 (H)  12/05/2013 142   Potassium (mmol/L)  Date Value  01/30/2020 4.4  12/05/2013 3.1 (L)   Magnesium (mg/dL)  Date Value  33/61/2244 2.1  12/05/2013 1.8   Phosphorus (mg/dL)  Date Value  97/53/0051 3.1   Calcium (mg/dL)  Date Value  09/01/1172 9.3   Calcium, Total (mg/dL)  Date Value  56/70/1410 8.4 (L)   Albumin (g/dL)  Date Value  30/13/1438 2.7 (L)  12/01/2013 2.7 (L)   Corrected Ca: 9.94 mg/dL  Assessment/Plan: 69 yo male admitted with hypotension likely secondary to hypovolemic shock secondary to dehydration/overdiuresis and possible sepsis with possible bilateral lower lobe pneumonia vs. partial compressive atelectasis   Sodium trending up. Felt to be s/t overdiuresis yesterday and torsemide discontinued. Sodium continues to trend up, now 152. Discussed with Dr. Allena Katz. Will start free water flushes at 100 ml q6h for now and adjust based on sodium level.  All other electrolytes stable. Will order BMP with morning labs.  Pricilla Riffle, PharmD 01/30/2020 9:40 AM

## 2020-01-31 ENCOUNTER — Inpatient Hospital Stay: Payer: No Typology Code available for payment source

## 2020-01-31 MED ORDER — GLYCOPYRROLATE 0.2 MG/ML IJ SOLN
0.2000 mg | INTRAMUSCULAR | Status: DC | PRN
  Administered 2020-01-31: 0.2 mg via INTRAVENOUS
  Filled 2020-01-31: qty 1

## 2020-01-31 MED ORDER — LORAZEPAM 2 MG/ML IJ SOLN
1.0000 mg | INTRAMUSCULAR | Status: DC | PRN
  Filled 2020-01-31: qty 1

## 2020-01-31 MED ORDER — SODIUM CHLORIDE 0.9 % IV SOLN: 250.0000 mL | INTRAVENOUS | Status: DC

## 2020-01-31 MED ORDER — MORPHINE SULFATE (PF) 2 MG/ML IV SOLN
1.0000 mg | INTRAVENOUS | Status: DC | PRN
  Administered 2020-01-31: 2 mg via INTRAVENOUS
  Filled 2020-01-31: qty 1

## 2020-01-31 MED ORDER — NOREPINEPHRINE 4 MG/250ML-% IV SOLN
2.0000 ug/min | INTRAVENOUS | Status: DC
  Administered 2020-01-31: 2 ug/min via INTRAVENOUS
  Filled 2020-01-31: qty 250

## 2020-02-02 LAB — ASPERGILLUS ANTIGEN, BAL/SERUM: Aspergillus Ag, BAL/Serum: 0.09 Index (ref 0.00–0.49)

## 2020-02-03 LAB — BLOOD GAS, ARTERIAL
Acid-Base Excess: 17.2 mmol/L — ABNORMAL HIGH (ref 0.0–2.0)
Bicarbonate: 46.8 mmol/L — ABNORMAL HIGH (ref 20.0–28.0)
Expiratory PAP: 10
FIO2: 100
Inspiratory PAP: 20
O2 Content: 12 L/min
O2 Saturation: 95.2 %
Patient temperature: 37
Patient temperature: 37
pCO2 arterial: 81 mmHg (ref 32.0–48.0)
pCO2 arterial: <19 mmHg — CL (ref 32.0–48.0)
pH, Arterial: 7.37 (ref 7.350–7.450)
pH, Arterial: 7.12 — CL (ref 7.350–7.450)
pO2, Arterial: 79 mmHg — ABNORMAL LOW (ref 83.0–108.0)
pO2, Arterial: 136 mmHg — ABNORMAL HIGH (ref 83.0–108.0)

## 2020-02-03 LAB — BLOOD GAS, VENOUS
FIO2: 100
Patient temperature: 37
pCO2, Ven: >120 mmHg (ref 44.0–60.0)
pH, Ven: 7.11 — CL (ref 7.250–7.430)
pO2, Ven: 71 mmHg — ABNORMAL HIGH (ref 32.0–45.0)

## 2020-02-04 ENCOUNTER — Ambulatory Visit: Payer: No Typology Code available for payment source | Admitting: Family

## 2020-02-11 NOTE — Progress Notes (Signed)
Pt expired at 0806. Called by Earney Mallet RN's. Dr Allena Katz also present.

## 2020-02-11 NOTE — Death Summary Note (Signed)
Death Summary  Dwayne Bridges DOB: [birth year] DOA: 01/18/2020  PCP: Patient, [name] Per  Admit date: 01/18/2020 Date of Death: 12-06-2019 Time of Death: 8:06 AM  History of present illness:  Dwayne Bridges is a 69 y.o. male with a history of HTN, HLD, GERD, anxiety, dHF, bipolar disorder, COVID infectioon in May 2020, atrial fibrillation on eliquis  Dwayne Bridges presented with complaint of hypotension and decreased PO intake.  Dwayne Bridges  is a 69 y.o. male with medical history significant of hypertension, hyperlipidemia, GERD, anxiety, dCHF, bipolar disorder, COVID-19 infection May/2020, atrial fibrillation on Eliquis. Presented with hypotension and decreased p.o. intake Recently was admitted for CHF exacerbation and diuresed At baseline pt is alert, orientated x3 but has social deficits per family likely has autism.  Patient was diuresed and at the time of discharge on 2 March was down to 2 L nasal cannula and -4.8 L.  During hospital stay was noted to have troponin elevation up to 85 thought to be likely secondary to demand ischemia secondary to CHF exacerbation. Given unclear mental status change his had a CT of the head which was unremarkable.  he was able to be discharged to SNF.  Unfortunately there after he continued to decline decreased p.o. intake and decreased urine output.  He was noted to be significantly hypotensive down to 50s EMS was called and he was brought back to emergency departmenta Pt was initially admitted to icu and then changed to hospitalist on 3/10.Continued on iv abx and and monitored with chest xray and antibiotics. Pt has been seen by me from the 01/27/20,  Pulmonary critical care was consulted for specialist care. Due to guarded prognosis palliative care was consulted.   Pt declined overnight and family was called and was transitioned to comfort care and  Pt passed at 8:06 am.  Final Diagnoses:  1.  Acute respiratory failure with hypoxia and  hypercapnia.    The results of significant diagnostics from this hospitalization (including imaging, microbiology, ancillary and laboratory) are listed below for reference.    Significant Diagnostic Studies: DG Chest 1 View  Result Date: 01/24/2020 CLINICAL DATA:  69 year old male with hypoxia. EXAM: CHEST  1 VIEW COMPARISON:  Chest radiograph dated 01/23/2020 FINDINGS: There is cardiomegaly with vascular congestion and edema. Overall interval worsening of the edema since the earlier radiograph. Pneumonia is not excluded. Clinical correlation is recommended. Small bilateral pleural effusions. No pneumothorax. No acute osseous pathology. IMPRESSION: Cardiomegaly with interval worsening of the pulmonary edema. Electronically Signed   By: Elgie CollardArash  Radparvar M.D.   On: 01/24/2020 01:23   DG Abdomen 1 View  Result Date: 01/18/2020 CLINICAL DATA:  Abdominal distension EXAM: ABDOMEN - 1 VIEW COMPARISON:  None. FINDINGS: The bowel gas pattern is nonspecific with mild gaseous distention of loops of colon and small bowel scattered throughout the abdomen. The stool burden is average. There are no radiopaque kidney stones. The bladder is moderately distended. There is no evidence for an acute displaced fracture IMPRESSION: Nonspecific bowel gas pattern with mild gaseous distention of loops of colon and small bowel scattered throughout the abdomen. Electronically Signed   By: Katherine Mantlehristopher  Green M.D.   On: 01/18/2020 00:31   CT HEAD WO CONTRAST  Result Date: 01/07/2020 CLINICAL DATA:  Oxygen desaturations, encephalopathy confusion EXAM: CT HEAD WITHOUT CONTRAST TECHNIQUE: Contiguous axial images were obtained from the base of the skull through the vertex without intravenous contrast. COMPARISON:  06/30/2019 FINDINGS: Brain: Chronic encephalomalacia within the bilateral  occipital lobes, right greater than left. Ex vacuo dilatation of the occipital horn of the right lateral ventricle unchanged. There are chronic small  vessel ischemic changes within the bilateral basal ganglia, right greater than left. No signs of acute infarct or hemorrhage. Remaining midline structures are unremarkable. No acute extra-axial fluid collections. No mass effect. Vascular: No hyperdense vessel or unexpected calcification. Skull: Normal. Negative for fracture or focal lesion. Sinuses/Orbits: Previous bilateral partial ethmoidectomies. Mild mucosal thickening throughout the ethmoid air cells. Remaining sinuses are clear. Other: None IMPRESSION: 1. Stable head CT, no acute intracranial process. Electronically Signed   By: Sharlet Salina M.D.   On: 01/07/2020 19:20   CT Angio Chest PE W and/or Wo Contrast  Result Date: 2020-02-14 CLINICAL DATA:  69 year old male with concern for pulmonary embolism. EXAM: CT ANGIOGRAPHY CHEST WITH CONTRAST TECHNIQUE: Multidetector CT imaging of the chest was performed using the standard protocol during bolus administration of intravenous contrast. Multiplanar CT image reconstructions and MIPs were obtained to evaluate the vascular anatomy. CONTRAST:  53mL OMNIPAQUE IOHEXOL 350 MG/ML SOLN COMPARISON:  Chest radiograph dated 2020-02-14. FINDINGS: Evaluation of this exam is limited due to respiratory motion artifact. Cardiovascular: There is moderate cardiomegaly. No significant pericardial effusion. There is advanced 3 vessel coronary vascular calcification and calcification of the mitral annulus. There is retrograde flow of contrast from the right atrium into the IVC indicative of right heart dysfunction. Correlation with echocardiogram recommended. There is mild atherosclerotic calcification of the thoracic aorta. No aneurysmal dilatation or dissection. Evaluation of the pulmonary arteries is limited due to respiratory motion artifact and suboptimal opacification of the peripheral branches. No large central pulmonary artery embolus identified. Mediastinum/Nodes: Mildly enlarged right hilar lymph nodes measure up to 17  mm. The esophagus is grossly unremarkable. No mediastinal fluid collection. Lungs/Pleura: Small right and trace left pleural effusions. There are bilateral lower lobe partial compressive atelectasis versus pneumonia. Clinical correlation is recommended. There is no pneumothorax. The central airways are patent. Upper Abdomen: Probable sludge within the gallbladder. Colonic diverticulosis. Musculoskeletal: Osteopenia with degenerative changes of the spine. No acute osseous pathology. Review of the MIP images confirms the above findings. IMPRESSION: 1. No CT evidence of central pulmonary artery embolus. 2. Moderate cardiomegaly with findings of right heart dysfunction. Correlation with echocardiogram recommended. 3. Small bilateral pleural effusions with bilateral lower lobe partial compressive atelectasis versus pneumonia. Clinical correlation is recommended. 4. Aortic Atherosclerosis (ICD10-I70.0). Electronically Signed   By: Elgie Collard M.D.   On: 2020/02/14 23:34   DG Chest Port 1 View  Result Date: 02/08/2020 CLINICAL DATA:  Shortness of breath. EXAM: PORTABLE CHEST 1 VIEW COMPARISON:  January 30, 2020. FINDINGS: Stable cardiomegaly. No pneumothorax is noted. No pneumothorax is noted. Stable elevated right hemidiaphragm is noted. Stable bilateral lung opacities are noted concerning for edema or pneumonia. Bony thorax is unremarkable. Small bilateral pleural effusions may be present. IMPRESSION: Stable bilateral lung opacities are noted concerning for edema or pneumonia. Electronically Signed   By: Lupita Raider M.D.   On: 01/21/2020 08:06   DG Chest Port 1 View  Result Date: 01/30/2020 CLINICAL DATA:  Shortness of breath EXAM: PORTABLE CHEST 1 VIEW COMPARISON:  01/30/2020 FINDINGS: Severe diffuse bilateral airspace disease, greater on the right. Bilateral pleural effusions, also greater on the right. Cardiomegaly. No acute bony abnormality. IMPRESSION: Severe bilateral airspace disease and effusions,  right greater than left. Aeration on the right worsened since prior study. Electronically Signed   By: Charlett Nose M.D.  On: 01/30/2020 21:42   DG Chest Port 1 View  Result Date: 01/30/2020 CLINICAL DATA:  Congestive heart failure. EXAM: PORTABLE CHEST 1 VIEW COMPARISON:  January 27, 2020. FINDINGS: Stable cardiomediastinal silhouette. Stable bilateral lung opacities are noted concerning for multifocal pneumonia with bilateral pleural effusions, right greater than left. Bony thorax is unremarkable. No pneumothorax is noted. IMPRESSION: Stable bilateral lung opacities concerning for multifocal pneumonia with bilateral pleural effusions, right greater than left. Electronically Signed   By: Lupita Raider M.D.   On: 01/30/2020 13:36   DG Chest Port 1 View  Result Date: 01/27/2020 CLINICAL DATA:  Aspiration into airway EXAM: PORTABLE CHEST 1 VIEW COMPARISON:  Yesterday FINDINGS: Extensive pulmonary opacity on the right more than left with small pleural effusions confirmed by recent CT. Cardiomegaly and vascular pedicle widening. No visible pneumothorax. IMPRESSION: Extensive airspace disease and right more than left pleural effusion. Opacity is stable or mildly increased from yesterday. Electronically Signed   By: Marnee Spring M.D.   On: 01/27/2020 07:25   DG Chest Port 1 View  Result Date: 01/26/2020 CLINICAL DATA:  Hypoxic, hypercapnic respiratory failure with bilateral pneumonia EXAM: PORTABLE CHEST 1 VIEW COMPARISON:  Radiograph 01/25/2020 FINDINGS: Diffuse bilateral heterogeneous interstitial and airspace opacities more pronounced in the right mid to lower lung. Overall extent and distribution appears slightly increased from comparison exam 1 day prior. Bilateral layering pleural effusions are seen. Cardiomediastinal contours are stable. No pneumothorax. No acute osseous or soft tissue abnormality. IMPRESSION: 1. Slight interval worsening of diffuse bilateral interstitial and airspace opacities  which may represent pulmonary edema, ARDS, multifocal pneumonia, or some combination there of. 2. Bilateral layering pleural effusions. Electronically Signed   By: Kreg Shropshire M.D.   On: 01/26/2020 06:54   DG Chest Port 1 View  Result Date: 01/25/2020 CLINICAL DATA:  Hypoxia. EXAM: PORTABLE CHEST 1 VIEW COMPARISON:  01/24/2020 FINDINGS: Cardiomediastinal contours remain enlarged. Increased interstitial markings present bilaterally with hazy opacification in both the right and left hemithorax. Right hemidiaphragm is more obscured than on the previous exam. Visualized skeletal structures are unremarkable. IMPRESSION: Findings could be seen in the setting of CHF or atypical infection. Increasing opacification in the right hemithorax may be due to increasing volume of pleural fluid and associated atelectatic change versus is developing infection/consolidation. Electronically Signed   By: Donzetta Kohut M.D.   On: 01/25/2020 08:56   DG Chest Port 1 View  Result Date: 01/24/2020 CLINICAL DATA:  Hypoxia.  Follow-up exam. EXAM: PORTABLE CHEST 1 VIEW COMPARISON:  01/24/2020 at 1:01 a.m. FINDINGS: Hazy airspace lung opacities have improved from the earlier exam. There is persistent vascular congestion with mild interstitial thickening. Suspect small effusions. No new lung abnormalities.  No pneumothorax. IMPRESSION: 1. Improved pulmonary edema.  No new lung abnormalities. Electronically Signed   By: Amie Portland M.D.   On: 01/24/2020 08:23   DG Chest Port 1 View  Result Date: 01/23/2020 CLINICAL DATA:  Hypoxia EXAM: PORTABLE CHEST 1 VIEW COMPARISON:  Eight days ago FINDINGS: Cardiomegaly and vascular pedicle widening. Increased hazy appearance of the right chest. There is vascular congestion on both sides. IMPRESSION: 1. Increased hazy opacity of the right chest, suspect increase or layering of a right pleural effusion. 2. Cardiomegaly and vascular congestion. Electronically Signed   By: Marnee Spring M.D.    On: 01/23/2020 06:20   DG Chest Port 1 View  Result Date: 01/18/2020 CLINICAL DATA:  69 year old male with hypotension and hypoxia. EXAM: PORTABLE CHEST 1  VIEW COMPARISON:  Portable chest 01/07/2020 and earlier. FINDINGS: Portable AP view at 2150 hours. Continued low lung volumes. Stable cardiac size and mediastinal contours. Visualized tracheal air column is within normal limits. Decreased veiling opacity and/or vascular congestion compared to last month. Allowing for portable technique the lungs are clear. No pneumothorax. Negative visible bowel gas pattern. No acute osseous abnormality identified. IMPRESSION: Low lung volumes but improved ventilation since last month. No acute cardiopulmonary abnormality. Electronically Signed   By: Odessa Fleming M.D.   On: 01/15/2020 22:03   DG Chest Portable 1 View  Result Date: 01/07/2020 CLINICAL DATA:  Shortness of breath. EXAM: PORTABLE CHEST 1 VIEW COMPARISON:  July 01, 2019. FINDINGS: Stable cardiomegaly. No pneumothorax is noted. New bilateral perihilar and basilar opacities are noted concerning for edema or possibly inflammation. Small right pleural effusion is noted. Bony thorax is unremarkable. IMPRESSION: New bilateral perihilar and basilar opacities are noted concerning for edema or possibly inflammation. Small right pleural effusion is noted. Electronically Signed   By: Lupita Raider M.D.   On: 01/07/2020 13:39   ECHOCARDIOGRAM COMPLETE  Result Date: 01/08/2020    ECHOCARDIOGRAM REPORT   Patient Name:   Dwayne Bridges Date of Exam: 01/08/2020 Medical Rec #:  [id number]      Height:       71.0 in Accession #:    [id number]     Weight:       221.3 lb Date of Birth:  12-20-1950      BSA:          2.201 m Patient Age:    68 years       BP:           110/70 mmHg Patient Gender: M              HR:           87 bpm. Exam Location:  [facility] Procedure: 2D Echo, Color Doppler, Cardiac Doppler and Intracardiac            Opacification Agent Indications:     I50.31  CHF-Acute Diastolic  History:         Patient has prior history of Echocardiogram examinations. CHF;                  Risk Factors:Sleep Apnea and Hypertension.  Sonographer:     Humphrey Rolls RDCS (AE) Referring Phys:  [id number] Brien Few NIU Diagnosing Phys: Julien Nordmann MD  Sonographer Comments: Suboptimal apical window. IMPRESSIONS  1. Left ventricular ejection fraction, by estimation, is 60 to 65%. The left ventricle has normal function. The left ventricle has no regional wall motion abnormalities. There is mild left ventricular hypertrophy. Left ventricular diastolic parameters are indeterminate.  2. Right ventricular systolic function is normal. The right ventricular size is normal. Tricuspid regurgitation signal is inadequate for assessing PA pressure.  3. Left atrial size was severely dilated.  4. Mild mitral stenosis. The mean mitral valve gradient is 7.0 mmHg.  5. The aortic valve is heavily calcified. Moderate aortic valve stenosis.  6. There is mild dilatation of the aortic root. FINDINGS  Left Ventricle: Left ventricular ejection fraction, by estimation, is 60 to 65%. The left ventricle has normal function. The left ventricle has no regional wall motion abnormalities. Definity contrast agent was given IV to delineate the left ventricular  endocardial borders. The left ventricular internal cavity size was normal in size. There is mild left ventricular hypertrophy. Left ventricular diastolic parameters are indeterminate. Right  Ventricle: The right ventricular size is normal. No increase in right ventricular wall thickness. Right ventricular systolic function is normal. Tricuspid regurgitation signal is inadequate for assessing PA pressure. Left Atrium: Left atrial size was severely dilated. Right Atrium: Right atrial size was normal in size. Pericardium: There is no evidence of pericardial effusion. Mitral Valve: The mitral valve was not well visualized. There is moderate calcification of the mitral valve  leaflet(s). Normal mobility of the mitral valve leaflets. Moderate to severe mitral annular calcification. No evidence of mitral valve regurgitation. Mild mitral valve stenosis. MV peak gradient, 15.7 mmHg. The mean mitral valve gradient is 7.0 mmHg. Tricuspid Valve: The tricuspid valve is normal in structure. Tricuspid valve regurgitation is not demonstrated. No evidence of tricuspid stenosis. Aortic Valve: The aortic valve is normal in structure and function. Aortic valve regurgitation is not visualized. Moderate aortic stenosis is present. Aortic valve mean gradient measures 24.0 mmHg. Aortic valve peak gradient measures 43.2 mmHg. Aortic valve area, by VTI measures 1.37 cm. Pulmonic Valve: The pulmonic valve was normal in structure. Pulmonic valve regurgitation is not visualized. No evidence of pulmonic stenosis. Aorta: The aortic root is normal in size and structure. There is mild dilatation of the aortic root. Venous: The inferior vena cava is normal in size with greater than 50% respiratory variability, suggesting right atrial pressure of 3 mmHg. IAS/Shunts: No atrial level shunt detected by color flow Doppler.  LEFT VENTRICLE PLAX 2D LVIDd:         4.37 cm  Diastology LVIDs:         3.29 cm  LV e' lateral:   7.72 cm/s LV PW:         1.18 cm  LV E/e' lateral: 24.7 LV IVS:        1.04 cm  LV e' medial:    7.72 cm/s LVOT diam:     2.30 cm  LV E/e' medial:  24.7 LV SV:         77 LV SV Index:   35 LVOT Area:     4.15 cm  RIGHT VENTRICLE RV Basal diam:  3.35 cm LEFT ATRIUM            Index       RIGHT ATRIUM           Index LA diam:      5.50 cm  2.50 cm/m  RA Area:     15.70 cm LA Vol (A4C): 116.0 ml 52.70 ml/m RA Volume:   37.00 ml  16.81 ml/m  AORTIC VALVE                    PULMONIC VALVE AV Area (Vmax):    1.35 cm     PV Vmax:       1.13 m/s AV Area (Vmean):   1.37 cm     PV Vmean:      77.400 cm/s AV Area (VTI):     1.37 cm     PV VTI:        0.190 m AV Vmax:           328.50 cm/s  PV Peak grad:   5.1 mmHg AV Vmean:          226.000 cm/s PV Mean grad:  3.0 mmHg AV VTI:            0.560 m AV Peak Grad:      43.2 mmHg AV Mean Grad:      24.0  mmHg LVOT Vmax:         107.00 cm/s LVOT Vmean:        74.600 cm/s LVOT VTI:          0.185 m LVOT/AV VTI ratio: 0.33  AORTA Ao Root diam: 3.90 cm MITRAL VALVE MV Area (PHT): 3.21 cm     SHUNTS MV Peak grad:  15.7 mmHg    Systemic VTI:  0.18 m MV Mean grad:  7.0 mmHg     Systemic Diam: 2.30 cm MV Vmax:       1.98 m/s MV Vmean:      125.0 cm/s MV Decel Time: 236 msec MV E velocity: 191.00 cm/s Julien Nordmann MD Electronically signed by Julien Nordmann MD Signature Date/Time: 01/08/2020/11:18:06 AM    Final    ECHOCARDIOGRAM LIMITED  Result Date: 01/21/2020    ECHOCARDIOGRAM LIMITED REPORT   Patient Name:   Dwayne Bridges Date of Exam: 01/20/2020 Medical Rec #:  [id number]      Height:       71.0 in Accession #:    [id number]     Weight:       223.3 lb Date of Birth:  September 17, 1951      BSA:          2.210 m Patient Age:    68 years       BP:           109/69 mmHg Patient Gender: M              HR:           68 bpm. Exam Location:  [facility] Procedure: 2D Echo, Limited Echo, Intracardiac Opacification Agent, Limited            Color Doppler and Cardiac Doppler Indications:     R06.00 Dyspnea  History:         Patient has prior history of Echocardiogram examinations, most                  recent 01/08/2020. Risk Factors:Hypertension. Sleep apnea.                  Congestive heart failure.  Sonographer:     Sedonia Small Rodgers-Jones Referring Phys:  [id number] Skeet Simmer JR Diagnosing Phys: Marcina Millard MD IMPRESSIONS  1. Left ventricular ejection fraction, by estimation, is 60 to 65%. The left ventricle has normal function. The left ventricle has no regional wall motion abnormalities.  2. Right ventricular systolic function is normal. The right ventricular size is normal. There is mildly elevated pulmonary artery systolic pressure.  3. The mitral valve is normal in structure.  Mild mitral valve regurgitation. No evidence of mitral stenosis.  4. The aortic valve is normal in structure. Aortic valve regurgitation is not visualized. No aortic stenosis is present.  5. The inferior vena cava is normal in size with greater than 50% respiratory variability, suggesting right atrial pressure of 3 mmHg. FINDINGS  Left Ventricle: Left ventricular ejection fraction, by estimation, is 60 to 65%. The left ventricle has normal function. The left ventricle has no regional wall motion abnormalities. Definity contrast agent was given IV to delineate the left ventricular  endocardial borders. The left ventricular internal cavity size was normal in size. There is no left ventricular hypertrophy. Right Ventricle: The right ventricular size is normal. No increase in right ventricular wall thickness. Right ventricular systolic function is normal. There is mildly elevated pulmonary artery systolic pressure. The tricuspid regurgitant velocity is  2.28  m/s, and with an assumed right atrial pressure of 10 mmHg, the estimated right ventricular systolic pressure is 30.8 mmHg. Left Atrium: Left atrial size was normal in size. Right Atrium: Right atrial size was normal in size. Pericardium: There is no evidence of pericardial effusion. Mitral Valve: The mitral valve is normal in structure. Normal mobility of the mitral valve leaflets. Moderate mitral annular calcification. Mild mitral valve regurgitation. No evidence of mitral valve stenosis. Tricuspid Valve: The tricuspid valve is normal in structure. Tricuspid valve regurgitation is mild . No evidence of tricuspid stenosis. Aortic Valve: The aortic valve is normal in structure. Aortic valve regurgitation is not visualized. No aortic stenosis is present. Aortic valve mean gradient measures 31.0 mmHg. Aortic valve peak gradient measures 43.6 mmHg. Pulmonic Valve: The pulmonic valve was normal in structure. Pulmonic valve regurgitation is not visualized. No evidence of  pulmonic stenosis. Aorta: The aortic root is normal in size and structure. Venous: The inferior vena cava is normal in size with greater than 50% respiratory variability, suggesting right atrial pressure of 3 mmHg. IAS/Shunts: No atrial level shunt detected by color flow Doppler.  LEFT VENTRICLE PLAX 2D LVIDd:         4.33 cm LVIDs:         2.30 cm LV PW:         1.18 cm LV IVS:        1.18 cm  RIGHT VENTRICLE RV Basal diam:  3.89 cm RV S prime:     13.45 cm/s TAPSE (M-mode): 1.7 cm LEFT ATRIUM         Index LA diam:    5.20 cm 2.35 cm/m  AORTIC VALVE AV Vmax:      330.00 cm/s AV Vmean:     264.000 cm/s AV VTI:       0.631 m AV Peak Grad: 43.6 mmHg AV Mean Grad: 31.0 mmHg  AORTA Ao Root diam: 3.30 cm TRICUSPID VALVE TR Peak grad:   20.8 mmHg TR Vmax:        228.00 cm/s Marcina Millard MD Electronically signed by Marcina Millard MD Signature Date/Time: 01/21/2020/4:11:56 PM    Final     Microbiology: No results found for this or any previous visit (from the past 240 hour(s)).   Labs: Basic Metabolic Panel: Recent Labs  Lab 01/25/20 0440 01/25/20 0440 01/26/20 0449 01/26/20 0449 01/27/20 3009 01/27/20 2330 01/28/20 0425 01/28/20 0425 01/29/20 0434 01/30/20 0359 01/30/20 0704  NA 144   < > 146*  --  143  --  146*  --  150*  --  152*  K 3.4*   < > 3.6   < > 3.0*   < > 3.6   < > 3.4*  --  4.4  CL 88*   < > 88*  --  85*  --  84*  --  85*  --  89*  CO2 45*   < > 50*  --  46*  --  49*  --  >50*  --  49*  GLUCOSE 101*   < > 128*  --  132*  --  126*  --  132*  --  134*  BUN 13   < > 14  --  14  --  17  --  18  --  21  CREATININE 0.40*   < > 0.38*  --  0.42*  --  0.40*  --  0.43*  --  0.60*  CALCIUM 8.5*   < >  8.4*  --  8.6*  --  8.6*  --  8.9  --  9.3  MG 1.8   < > 1.6*  --  1.8  --  1.7  --  1.8 2.1  --   PHOS 2.1*  --  2.5  --  2.9  --  2.2*  --  3.1  --   --    < > = values in this interval not displayed.   Liver Function Tests: Recent Labs  Lab 01/25/20 0440 01/26/20 0449  01/27/20 0628  AST 13* 15 15  ALT ALKPHOS 50 50 56  BILITOT 0.8 0.9 1.0  PROT 6.0* 6.0* 6.8  ALBUMIN 2.4* 2.2* 2.7*   No results for input(s): LIPASE, AMYLASE in the last 168 hours. No results for input(s): AMMONIA in the last 168 hours. CBC: Recent Labs  Lab 01/25/20 0440 01/25/20 0440 01/26/20 0449 01/27/20 0628 01/28/20 0425 01/29/20 0738 01/30/20 0359  WBC 10.1   < > 10.4 11.5* 9.2 11.2* 9.9  NEUTROABS 7.9*  --  8.2* 9.1*  --  8.9* 7.9*  HGB 10.3*   < > 10.0* 10.2* 9.7* 10.3* 9.8*  HCT 33.5*   < > 31.9* 32.7* 31.0* 34.1* 33.2*  MCV 96.8   < > 96.4 95.6 96.0 99.4 100.9*  PLT 161   < > 162 169 153 181 169   < > = values in this interval not displayed.   Cardiac Enzymes: No results for input(s): CKTOTAL, CKMB, CKMBINDEX, TROPONINI in the last 168 hours. D-Dimer No results for input(s): DDIMER in the last 72 hours. BNP: Invalid input(s): POCBNP CBG: Recent Labs  Lab 01/24/20 1258  GLUCAP 187*   Anemia work up No results for input(s): VITAMINB12, FOLATE, FERRITIN, TIBC, IRON, RETICCTPCT in the last 72 hours. Urinalysis    Component Value Date/Time   COLORURINE YELLOW (A) 02/07/2020 2154   APPEARANCEUR CLEAR (A) 2020-02-07 2154   APPEARANCEUR Clear 03/25/2014 2000   LABSPEC 1.006 2020/02/07 2154   LABSPEC 1.016 03/25/2014 2000   PHURINE 7.0 2020-02-07 2154   GLUCOSEU NEGATIVE 02-07-2020 2154   GLUCOSEU Negative 03/25/2014 2000   HGBUR NEGATIVE 02/07/2020 2154   BILIRUBINUR NEGATIVE 02-07-20 2154   BILIRUBINUR Negative 03/25/2014 2000   KETONESUR NEGATIVE 02/07/20 2154   PROTEINUR NEGATIVE February 07, 2020 2154   NITRITE NEGATIVE 02-07-20 2154   LEUKOCYTESUR NEGATIVE 07-Feb-2020 2154   LEUKOCYTESUR Negative 03/25/2014 2000   Sepsis Labs Invalid input(s): PROCALCITONIN,  WBC,  LACTICIDVEN  SIGNED:  Gertha Calkin, MD  [facility] 02/08/2020, 12:22 PM Pager   If 7PM-7AM, please contact night-coverage [email or web address] Password [email or web address]

## 2020-02-11 NOTE — Significant Event (Signed)
Pt continued to decline throughout the night developing worsening hypotension sbp 60-70's despite low dose levophed.  Pt remained on Bipap at maximal settings with FiO2 @100 % throughout the night, however he remained severely acidotic.  UOP output significantly declined throughout the night and pt currently unresponsive.  Notified pts niece/HCPOA and Mrs. Yeaman's husband via telephone regarding continued decline in pt condition despite treatment.  I informed her the pt would unlikely survive this hospitalization, and would not tolerate transport to [facility].  I recommended transitioning the pt to Comfort Measures Only in the hospital due to significant decline in pts condition.  Mrs. [facility] stated she would like to proceed with transitioning pt to comfort measures only. She informed me she along with her husband are going to come to the hospital and attempt to be present pts bedside prior to him passing away, however they live 2 hrs away.  Mrs. Jannette Spanner stated she wants pt transitioned to comfort measures immediately, and DOES NOT want to wait until they arrive at bedside.  Mrs. Jannette Spanner stated her main priority is to ensure the pt is comfortable and pain free.  Mrs. Jannette Spanner also stated if the pt passes away before she arrives at bedside she would like the nursing staff to inform her because she does not want to be present at bedside if he has already expired. Pt transitioned to comfort measures only per Mrs. Yeaman's request.  Plan of care discussed with nursing staff.  Will continue to monitor and assess pt.  Jannette Spanner, AGNP  Pulmonary/Critical Care Pager [phone] (please enter 7 digits) PCCM Consult Pager [phone] (please enter 7 digits)

## 2020-02-11 NOTE — Progress Notes (Signed)
   02/09/2020 0800  Clinical Encounter Type  Visited With Patient;Health care provider  Visit Type Spiritual support  Referral From Physician  Consult/Referral To Chaplain  Spiritual Encounters  Spiritual Needs Prayer  This was a page from the health care provider. Patient is at the end of life. Health care provider asked Chaplain to say a prayer over patient as he is transitioning. Chaplain spoke a soft prayer with patient.

## 2020-02-11 DEATH — deceased

## 2022-02-18 IMAGING — DX DG CHEST 1V
1 series · 2 of 2 positions shown · non-contrast
Comparison: Chest radiograph dated 01/23/2020

CLINICAL DATA: 69-year-old male with hypoxia.

EXAM:
CHEST  1 VIEW

[Series 1: chest ap · 0.14mm/px · 2 of 2 slices shown]
[im 1/2]
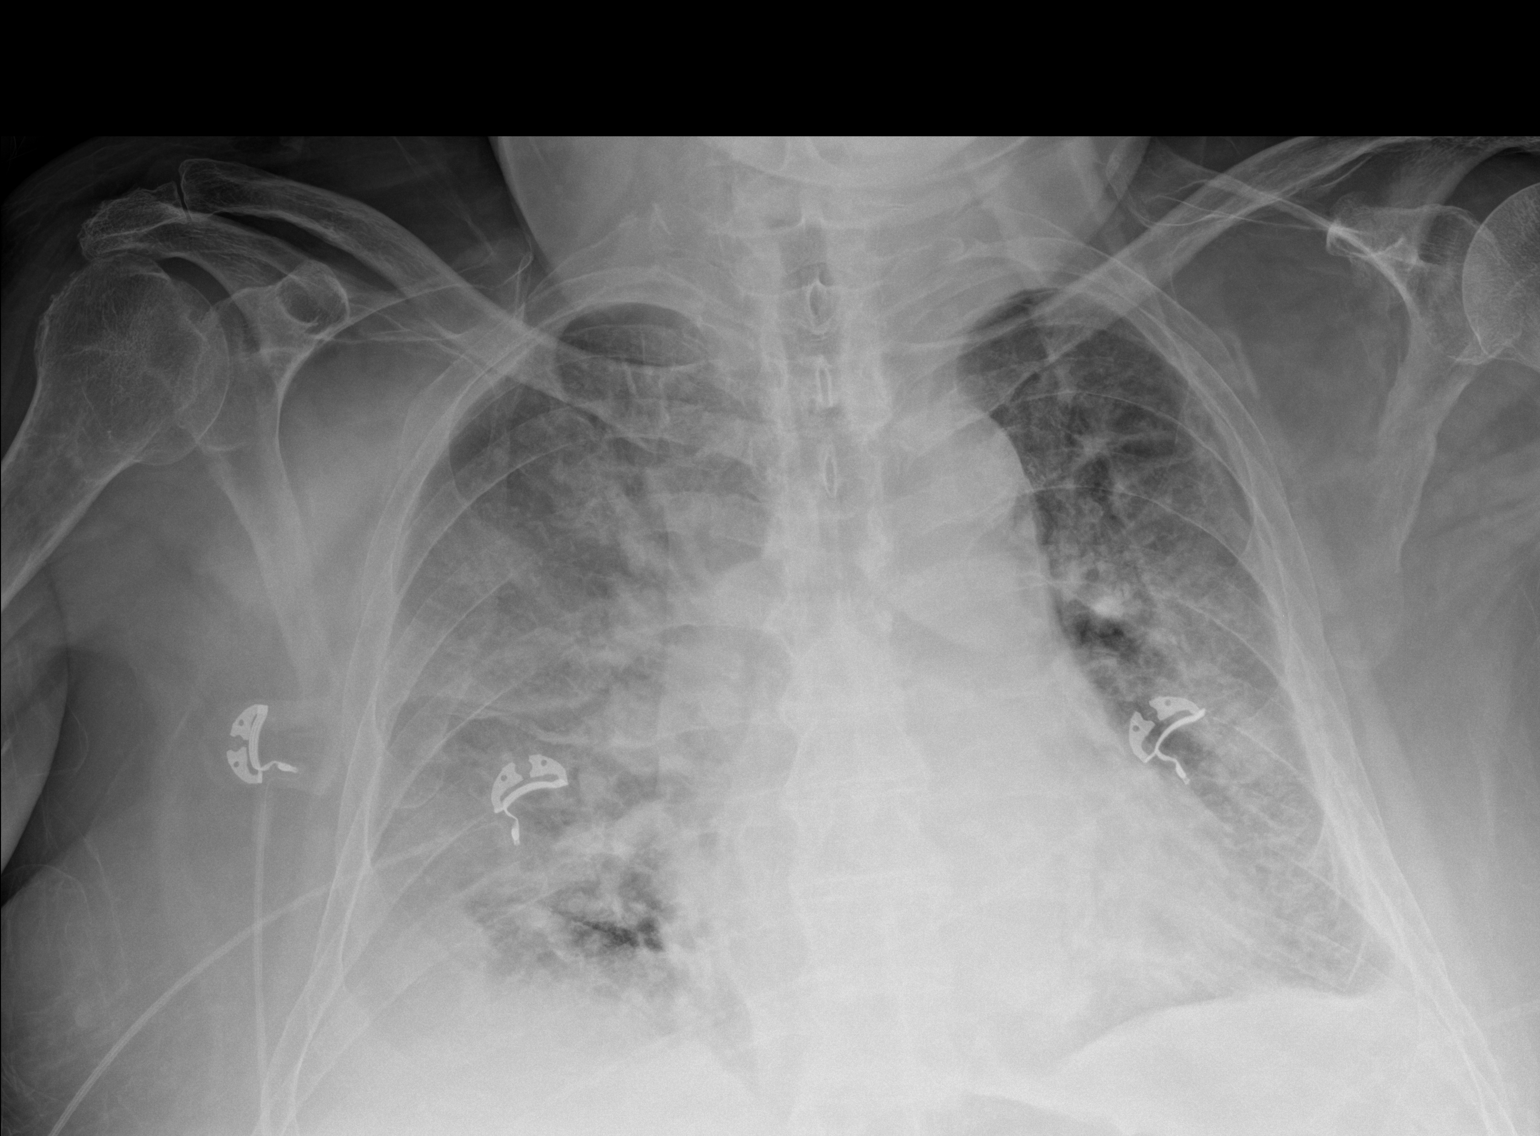
[im 2/2]
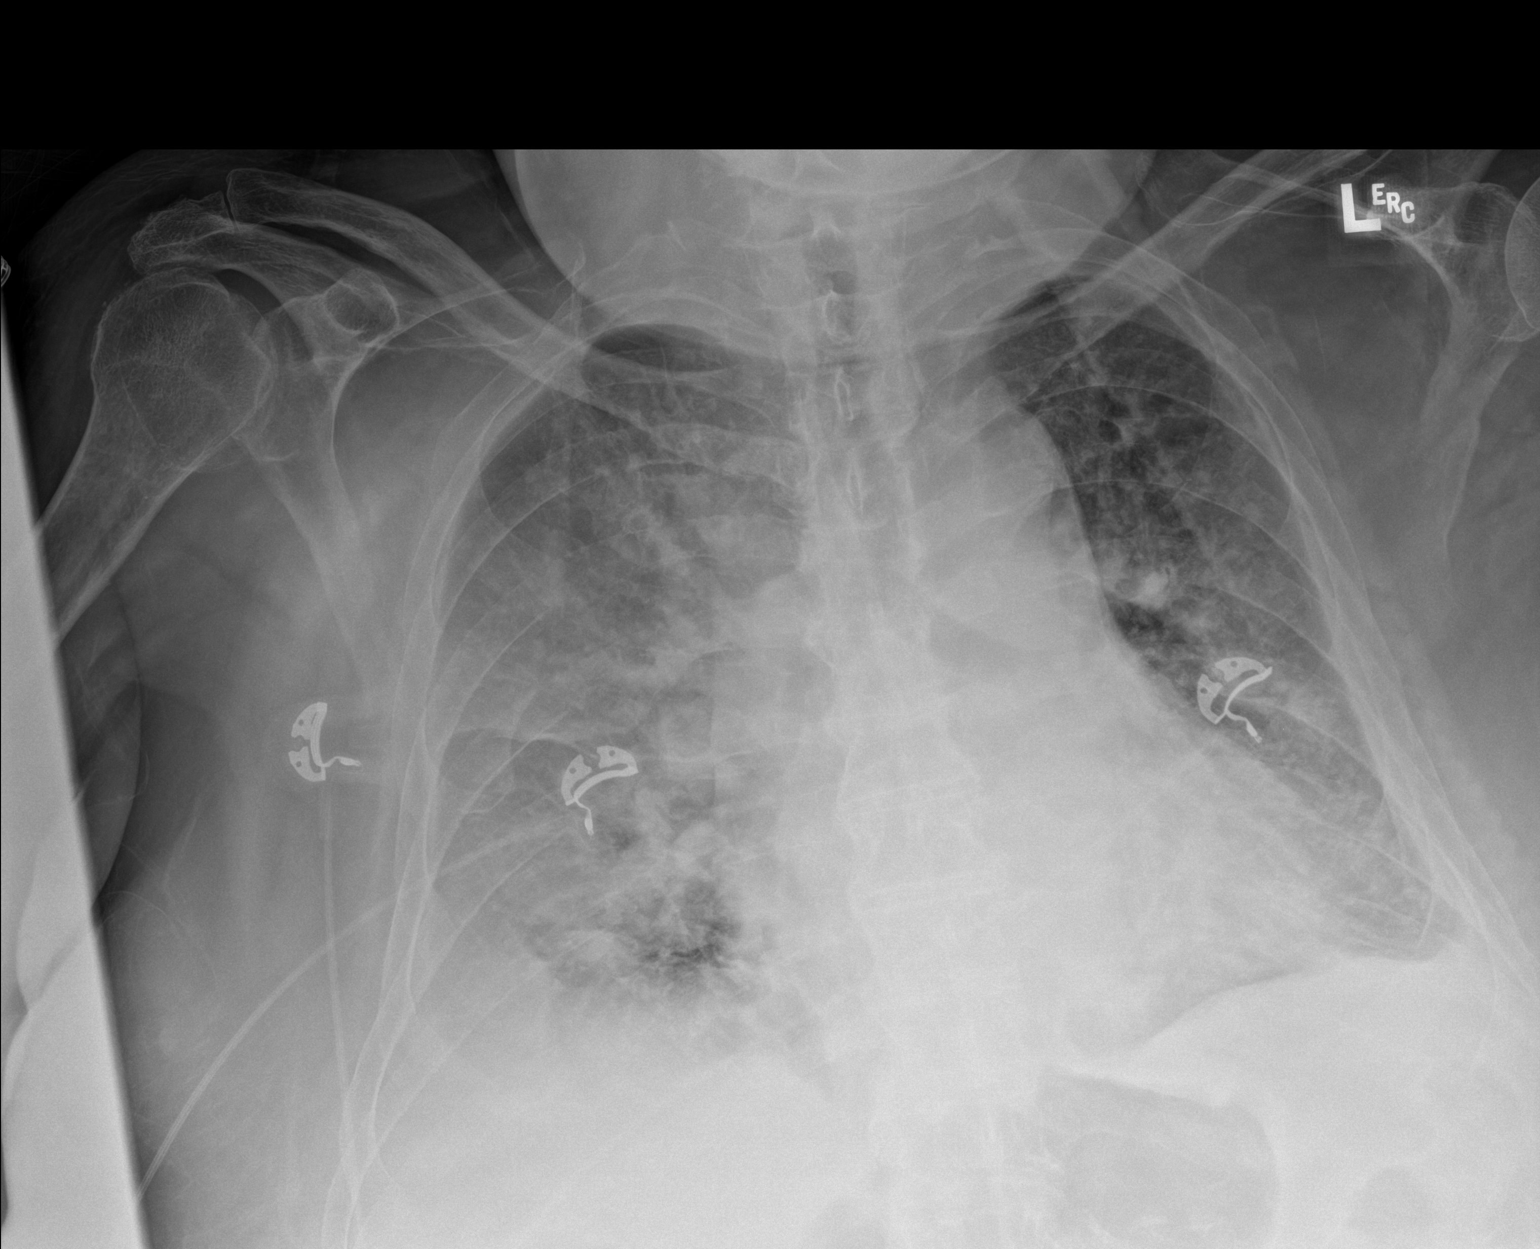

[2 of 2 positions shown; findings below may reference images not displayed]

FINDINGS: There is cardiomegaly with vascular congestion and edema. Overall
interval worsening of the edema since the earlier radiograph.
Pneumonia is not excluded. Clinical correlation is recommended.
Small bilateral pleural effusions. No pneumothorax. No acute osseous
pathology.
IMPRESSION: Cardiomegaly with interval worsening of the pulmonary edema.

## 2022-02-21 IMAGING — DX DG CHEST 1V PORT
1 series · 1 of 1 positions shown · non-contrast
Comparison: Yesterday

CLINICAL DATA: Aspiration into airway

EXAM:
PORTABLE CHEST 1 VIEW

[chest ap]
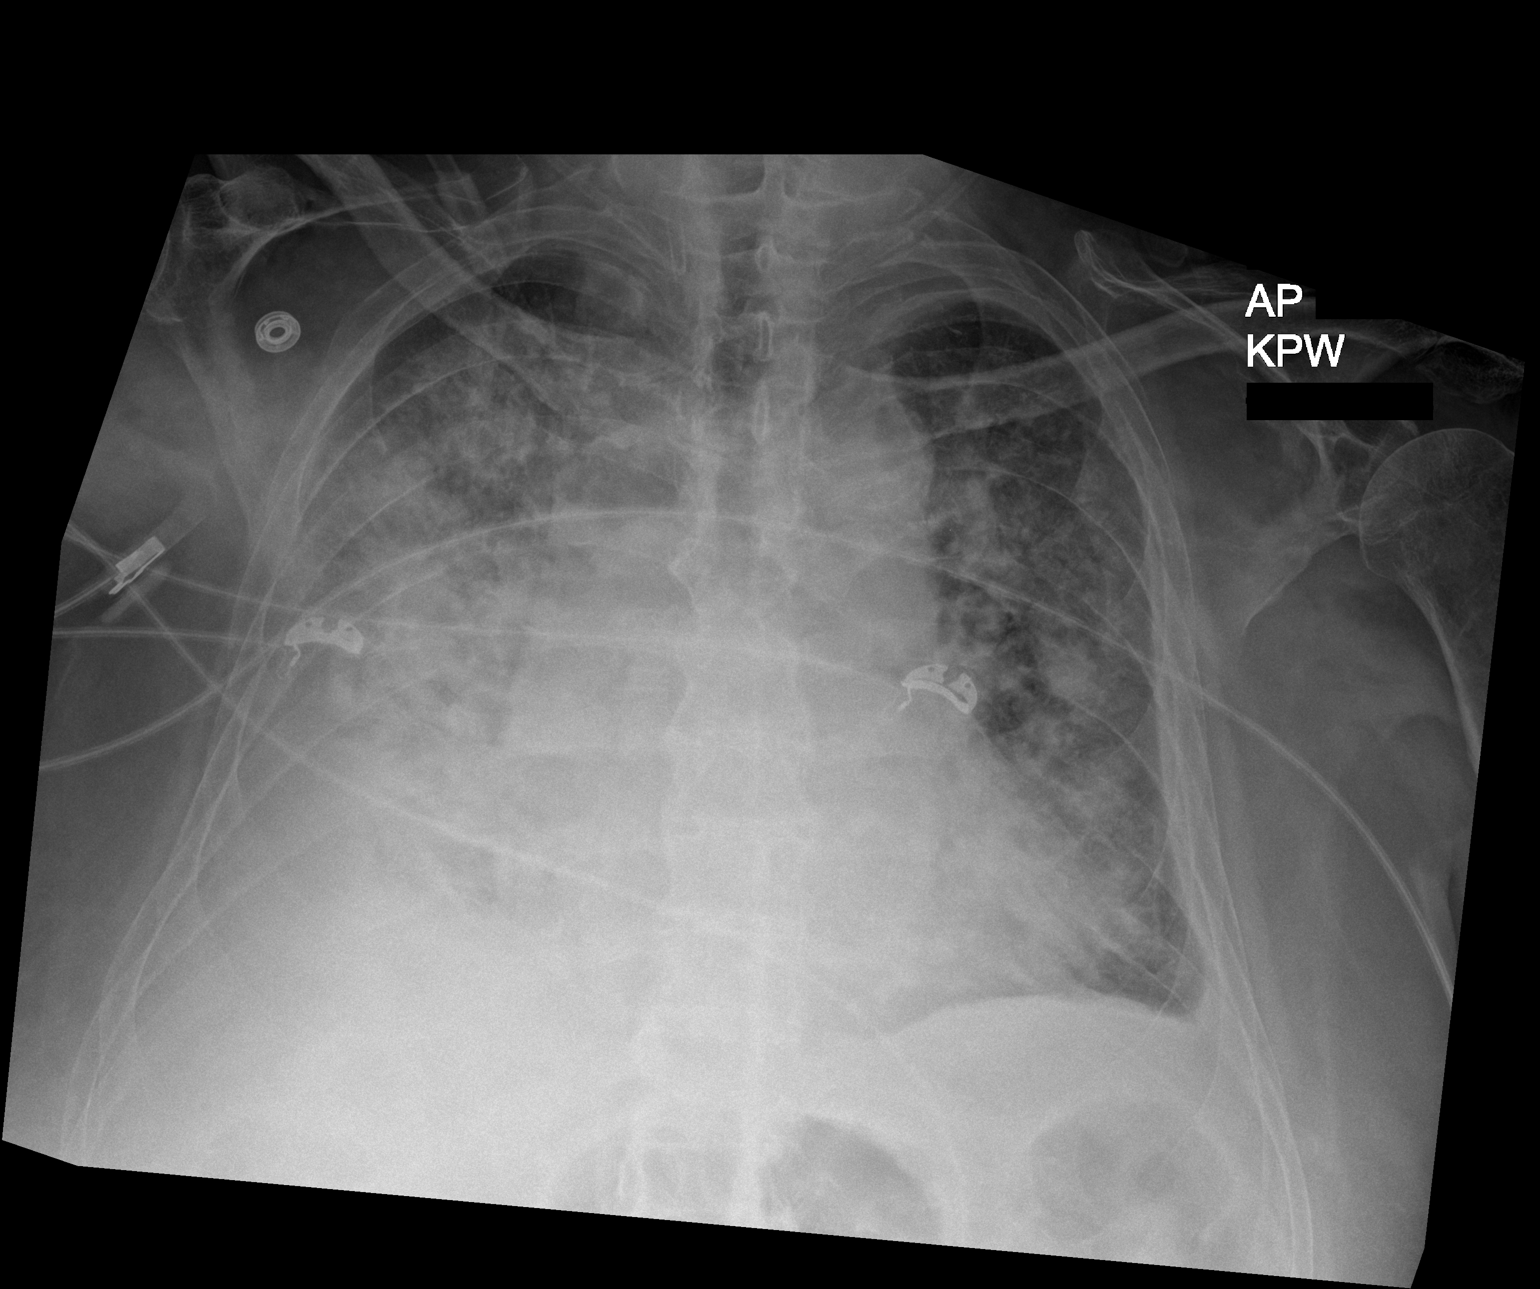

[1 of 1 positions shown; findings below may reference images not displayed]

FINDINGS: Extensive pulmonary opacity on the right more than left with small
pleural effusions confirmed by recent CT. Cardiomegaly and vascular
pedicle widening. No visible pneumothorax.
IMPRESSION: Extensive airspace disease and right more than left pleural
effusion. Opacity is stable or mildly increased from yesterday.
